# Patient Record
Sex: Female | Born: 1960 | Race: White | Hispanic: No | Smoking: Never smoker
Health system: Southern US, Community
[De-identification: ages and names within clinical notes are randomized; demographics above are authoritative.]

## PROBLEM LIST (undated history)

## (undated) DIAGNOSIS — G4730 Sleep apnea, unspecified: Secondary | ICD-10-CM

## (undated) DIAGNOSIS — T7840XA Allergy, unspecified, initial encounter: Secondary | ICD-10-CM

## (undated) DIAGNOSIS — K589 Irritable bowel syndrome without diarrhea: Secondary | ICD-10-CM

## (undated) DIAGNOSIS — F419 Anxiety disorder, unspecified: Secondary | ICD-10-CM

## (undated) DIAGNOSIS — J329 Chronic sinusitis, unspecified: Secondary | ICD-10-CM

## (undated) DIAGNOSIS — B358 Other dermatophytoses: Secondary | ICD-10-CM

## (undated) DIAGNOSIS — F3289 Other specified depressive episodes: Secondary | ICD-10-CM

## (undated) DIAGNOSIS — E785 Hyperlipidemia, unspecified: Secondary | ICD-10-CM

## (undated) DIAGNOSIS — R1032 Left lower quadrant pain: Secondary | ICD-10-CM

## (undated) DIAGNOSIS — R197 Diarrhea, unspecified: Secondary | ICD-10-CM

## (undated) DIAGNOSIS — F329 Major depressive disorder, single episode, unspecified: Secondary | ICD-10-CM

## (undated) DIAGNOSIS — K5730 Diverticulosis of large intestine without perforation or abscess without bleeding: Secondary | ICD-10-CM

## (undated) HISTORY — DX: Other dermatophytoses: B35.8

## (undated) HISTORY — DX: Hyperlipidemia, unspecified: E78.5

## (undated) HISTORY — DX: Diarrhea, unspecified: R19.7

## (undated) HISTORY — DX: Left lower quadrant pain: R10.32

## (undated) HISTORY — PX: NASAL SINUS SURGERY: SHX719

## (undated) HISTORY — DX: Allergy, unspecified, initial encounter: T78.40XA

## (undated) HISTORY — PX: COLONOSCOPY: SHX174

## (undated) HISTORY — DX: Anxiety disorder, unspecified: F41.9

## (undated) HISTORY — DX: Other specified depressive episodes: F32.89

## (undated) HISTORY — PX: OTHER SURGICAL HISTORY: SHX169

## (undated) HISTORY — DX: Major depressive disorder, single episode, unspecified: F32.9

## (undated) HISTORY — DX: Sleep apnea, unspecified: G47.30

## (undated) HISTORY — DX: Diverticulosis of large intestine without perforation or abscess without bleeding: K57.30

## (undated) HISTORY — DX: Irritable bowel syndrome, unspecified: K58.9

## (undated) HISTORY — DX: Chronic sinusitis, unspecified: J32.9

---

## 1995-09-11 HISTORY — PX: ABDOMINAL HYSTERECTOMY: SHX81

## 1998-05-13 ENCOUNTER — Other Ambulatory Visit: Admission: RE | Admit: 1998-05-13 | Discharge: 1998-05-13 | Payer: Self-pay | Admitting: Obstetrics and Gynecology

## 1999-06-06 ENCOUNTER — Emergency Department (HOSPITAL_COMMUNITY): Admission: EM | Admit: 1999-06-06 | Discharge: 1999-06-06 | Payer: Self-pay | Admitting: Emergency Medicine

## 2004-10-06 ENCOUNTER — Ambulatory Visit (HOSPITAL_COMMUNITY): Admission: RE | Admit: 2004-10-06 | Discharge: 2004-10-06 | Payer: Self-pay | Admitting: Family Medicine

## 2005-03-22 ENCOUNTER — Ambulatory Visit: Payer: Self-pay | Admitting: Internal Medicine

## 2005-05-16 ENCOUNTER — Ambulatory Visit: Payer: Self-pay | Admitting: Internal Medicine

## 2006-01-23 ENCOUNTER — Other Ambulatory Visit: Admission: RE | Admit: 2006-01-23 | Discharge: 2006-01-23 | Payer: Self-pay | Admitting: Family Medicine

## 2008-02-23 ENCOUNTER — Other Ambulatory Visit: Admission: RE | Admit: 2008-02-23 | Discharge: 2008-02-23 | Payer: Self-pay | Admitting: Family Medicine

## 2009-02-17 ENCOUNTER — Emergency Department (HOSPITAL_BASED_OUTPATIENT_CLINIC_OR_DEPARTMENT_OTHER): Admission: EM | Admit: 2009-02-17 | Discharge: 2009-02-17 | Payer: Self-pay | Admitting: Emergency Medicine

## 2009-02-17 ENCOUNTER — Ambulatory Visit: Payer: Self-pay | Admitting: Diagnostic Radiology

## 2009-03-01 ENCOUNTER — Encounter: Admission: RE | Admit: 2009-03-01 | Discharge: 2009-03-01 | Payer: Self-pay | Admitting: Family Medicine

## 2009-03-11 ENCOUNTER — Encounter (INDEPENDENT_AMBULATORY_CARE_PROVIDER_SITE_OTHER): Payer: Self-pay | Admitting: *Deleted

## 2009-03-11 ENCOUNTER — Ambulatory Visit: Payer: Self-pay | Admitting: Gastroenterology

## 2009-03-11 ENCOUNTER — Ambulatory Visit: Payer: Self-pay | Admitting: Cardiovascular Disease

## 2009-03-11 DIAGNOSIS — K5730 Diverticulosis of large intestine without perforation or abscess without bleeding: Secondary | ICD-10-CM | POA: Insufficient documentation

## 2009-03-11 DIAGNOSIS — J329 Chronic sinusitis, unspecified: Secondary | ICD-10-CM | POA: Insufficient documentation

## 2009-03-11 DIAGNOSIS — F3289 Other specified depressive episodes: Secondary | ICD-10-CM | POA: Insufficient documentation

## 2009-03-11 DIAGNOSIS — R1032 Left lower quadrant pain: Secondary | ICD-10-CM | POA: Insufficient documentation

## 2009-03-11 DIAGNOSIS — E785 Hyperlipidemia, unspecified: Secondary | ICD-10-CM | POA: Insufficient documentation

## 2009-03-11 DIAGNOSIS — R110 Nausea: Secondary | ICD-10-CM | POA: Insufficient documentation

## 2009-03-11 DIAGNOSIS — F329 Major depressive disorder, single episode, unspecified: Secondary | ICD-10-CM

## 2009-03-11 LAB — CONVERTED CEMR LAB
BUN: 10 mg/dL (ref 6–23)
Basophils Absolute: 0.2 10*3/uL — ABNORMAL HIGH (ref 0.0–0.1)
Basophils Relative: 4 % — ABNORMAL HIGH (ref 0.0–3.0)
Bilirubin Urine: NEGATIVE
CO2: 31 meq/L (ref 19–32)
Calcium: 9.1 mg/dL (ref 8.4–10.5)
Chloride: 102 meq/L (ref 96–112)
Creatinine, Ser: 0.9 mg/dL (ref 0.4–1.2)
Eosinophils Absolute: 0.5 10*3/uL (ref 0.0–0.7)
Eosinophils Relative: 9.4 % — ABNORMAL HIGH (ref 0.0–5.0)
GFR calc non Af Amer: 70.39 mL/min (ref >60)
Glucose, Bld: 97 mg/dL (ref 70–99)
HCT: 41.3 % (ref 36.0–46.0)
Hemoglobin, Urine: NEGATIVE
Hemoglobin: 14.4 g/dL (ref 12.0–15.0)
Ketones, ur: NEGATIVE mg/dL
Lymphocytes Relative: 18.9 % (ref 12.0–46.0)
Lymphs Abs: 1 10*3/uL (ref 0.7–4.0)
MCHC: 34.8 g/dL (ref 30.0–36.0)
MCV: 87.4 fL (ref 78.0–100.0)
Monocytes Absolute: 0.5 10*3/uL (ref 0.1–1.0)
Monocytes Relative: 8.9 % (ref 3.0–12.0)
Neutro Abs: 3.1 10*3/uL (ref 1.4–7.7)
Neutrophils Relative %: 58.8 % (ref 43.0–77.0)
Nitrite: NEGATIVE
Platelets: 249 10*3/uL (ref 150.0–400.0)
Potassium: 3.8 meq/L (ref 3.5–5.1)
RBC: 4.72 M/uL (ref 3.87–5.11)
RDW: 12.6 % (ref 11.5–14.6)
Sodium: 139 meq/L (ref 135–145)
Specific Gravity, Urine: <=1.005 (ref 1.000–1.030)
Total Protein, Urine: NEGATIVE mg/dL
Urine Glucose: NEGATIVE mg/dL
Urobilinogen, UA: 0.2 (ref 0.0–1.0)
WBC: 5.3 10*3/uL (ref 4.5–10.5)
pH: 7 (ref 5.0–8.0)

## 2009-03-15 ENCOUNTER — Telehealth (INDEPENDENT_AMBULATORY_CARE_PROVIDER_SITE_OTHER): Payer: Self-pay | Admitting: *Deleted

## 2009-03-15 ENCOUNTER — Telehealth: Payer: Self-pay | Admitting: Internal Medicine

## 2009-03-15 DIAGNOSIS — K625 Hemorrhage of anus and rectum: Secondary | ICD-10-CM | POA: Insufficient documentation

## 2009-03-16 ENCOUNTER — Ambulatory Visit: Payer: Self-pay | Admitting: Internal Medicine

## 2009-03-17 ENCOUNTER — Telehealth: Payer: Self-pay | Admitting: Internal Medicine

## 2009-03-18 ENCOUNTER — Ambulatory Visit (HOSPITAL_COMMUNITY): Admission: RE | Admit: 2009-03-18 | Discharge: 2009-03-18 | Payer: Self-pay | Admitting: Internal Medicine

## 2009-03-18 ENCOUNTER — Encounter: Payer: Self-pay | Admitting: Internal Medicine

## 2009-03-21 ENCOUNTER — Encounter: Admission: RE | Admit: 2009-03-21 | Discharge: 2009-03-21 | Payer: Self-pay | Admitting: Family Medicine

## 2009-03-28 ENCOUNTER — Ambulatory Visit: Payer: Self-pay | Admitting: Internal Medicine

## 2009-03-28 ENCOUNTER — Inpatient Hospital Stay (HOSPITAL_COMMUNITY): Admission: AD | Admit: 2009-03-28 | Discharge: 2009-03-30 | Payer: Self-pay | Admitting: Internal Medicine

## 2009-03-28 ENCOUNTER — Telehealth: Payer: Self-pay | Admitting: Internal Medicine

## 2009-03-28 DIAGNOSIS — K589 Irritable bowel syndrome without diarrhea: Secondary | ICD-10-CM | POA: Insufficient documentation

## 2009-03-28 DIAGNOSIS — R197 Diarrhea, unspecified: Secondary | ICD-10-CM | POA: Insufficient documentation

## 2009-03-31 ENCOUNTER — Telehealth: Payer: Self-pay | Admitting: Internal Medicine

## 2009-04-11 ENCOUNTER — Encounter: Admission: RE | Admit: 2009-04-11 | Discharge: 2009-06-16 | Payer: Self-pay | Admitting: Family Medicine

## 2009-04-12 ENCOUNTER — Telehealth: Payer: Self-pay | Admitting: Internal Medicine

## 2009-04-27 ENCOUNTER — Ambulatory Visit: Payer: Self-pay | Admitting: Internal Medicine

## 2009-04-27 DIAGNOSIS — G4730 Sleep apnea, unspecified: Secondary | ICD-10-CM | POA: Insufficient documentation

## 2009-05-19 ENCOUNTER — Telehealth: Payer: Self-pay | Admitting: Internal Medicine

## 2009-05-20 ENCOUNTER — Ambulatory Visit: Payer: Self-pay | Admitting: Internal Medicine

## 2009-05-21 ENCOUNTER — Encounter: Payer: Self-pay | Admitting: Internal Medicine

## 2009-12-02 ENCOUNTER — Ambulatory Visit: Payer: Self-pay | Admitting: Gastroenterology

## 2009-12-02 ENCOUNTER — Telehealth: Payer: Self-pay | Admitting: Internal Medicine

## 2009-12-02 DIAGNOSIS — K5900 Constipation, unspecified: Secondary | ICD-10-CM | POA: Insufficient documentation

## 2009-12-02 DIAGNOSIS — R143 Flatulence: Secondary | ICD-10-CM

## 2009-12-02 DIAGNOSIS — R142 Eructation: Secondary | ICD-10-CM | POA: Insufficient documentation

## 2009-12-02 DIAGNOSIS — R1031 Right lower quadrant pain: Secondary | ICD-10-CM | POA: Insufficient documentation

## 2009-12-02 DIAGNOSIS — R141 Gas pain: Secondary | ICD-10-CM

## 2009-12-03 ENCOUNTER — Emergency Department (HOSPITAL_COMMUNITY): Admission: EM | Admit: 2009-12-03 | Discharge: 2009-12-03 | Payer: Self-pay | Admitting: Emergency Medicine

## 2009-12-05 ENCOUNTER — Ambulatory Visit: Payer: Self-pay | Admitting: Nurse Practitioner

## 2009-12-05 ENCOUNTER — Telehealth: Payer: Self-pay | Admitting: Nurse Practitioner

## 2009-12-05 LAB — CONVERTED CEMR LAB
BUN: 8 mg/dL (ref 6–23)
Basophils Absolute: 0.1 10*3/uL (ref 0.0–0.1)
Basophils Absolute: 0 10*3/uL (ref 0.0–0.1)
Basophils Relative: 1.1 % (ref 0.0–3.0)
Basophils Relative: 0.6 % (ref 0.0–3.0)
CO2: 32 meq/L (ref 19–32)
CRP, High Sensitivity: 41.1 — ABNORMAL HIGH (ref 0.00–5.00)
Calcium: 9.4 mg/dL (ref 8.4–10.5)
Chloride: 101 meq/L (ref 96–112)
Creatinine, Ser: 0.9 mg/dL (ref 0.4–1.2)
Eosinophils Absolute: 0.2 10*3/uL (ref 0.0–0.7)
Eosinophils Absolute: 0.1 10*3/uL (ref 0.0–0.7)
Eosinophils Relative: 2.6 % (ref 0.0–5.0)
Eosinophils Relative: 1.7 % (ref 0.0–5.0)
GFR calc non Af Amer: 70.19 mL/min (ref >60)
Glucose, Bld: 94 mg/dL (ref 70–99)
HCT: 40.3 % (ref 36.0–46.0)
HCT: 41.8 % (ref 36.0–46.0)
Hemoglobin: 13.3 g/dL (ref 12.0–15.0)
Hemoglobin: 13.8 g/dL (ref 12.0–15.0)
Lymphocytes Relative: 23.5 % (ref 12.0–46.0)
Lymphocytes Relative: 18.8 % (ref 12.0–46.0)
Lymphs Abs: 1.4 10*3/uL (ref 0.7–4.0)
Lymphs Abs: 0.8 10*3/uL (ref 0.7–4.0)
MCHC: 33.1 g/dL (ref 30.0–36.0)
MCHC: 33 g/dL (ref 30.0–36.0)
MCV: 89.1 fL (ref 78.0–100.0)
MCV: 87.7 fL (ref 78.0–100.0)
Monocytes Absolute: 0.5 10*3/uL (ref 0.1–1.0)
Monocytes Absolute: 0.3 10*3/uL (ref 0.1–1.0)
Monocytes Relative: 8.7 % (ref 3.0–12.0)
Monocytes Relative: 7.1 % (ref 3.0–12.0)
Neutro Abs: 3.9 10*3/uL (ref 1.4–7.7)
Neutro Abs: 2.9 10*3/uL (ref 1.4–7.7)
Neutrophils Relative %: 64.1 % (ref 43.0–77.0)
Neutrophils Relative %: 71.8 % (ref 43.0–77.0)
Platelets: 251 10*3/uL (ref 150.0–400.0)
Platelets: 253 10*3/uL (ref 150.0–400.0)
Potassium: 3.9 meq/L (ref 3.5–5.1)
RBC: 4.52 M/uL (ref 3.87–5.11)
RBC: 4.76 M/uL (ref 3.87–5.11)
RDW: 12.4 % (ref 11.5–14.6)
RDW: 12.4 % (ref 11.5–14.6)
Sed Rate: 41 mm/hr — ABNORMAL HIGH (ref 0–22)
Sodium: 142 meq/L (ref 135–145)
WBC: 6.1 10*3/uL (ref 4.5–10.5)
WBC: 4.1 10*3/uL — ABNORMAL LOW (ref 4.5–10.5)

## 2009-12-06 ENCOUNTER — Telehealth: Payer: Self-pay | Admitting: Nurse Practitioner

## 2009-12-08 ENCOUNTER — Ambulatory Visit: Payer: Self-pay | Admitting: Internal Medicine

## 2009-12-08 ENCOUNTER — Telehealth: Payer: Self-pay | Admitting: Nurse Practitioner

## 2009-12-14 ENCOUNTER — Ambulatory Visit: Payer: Self-pay | Admitting: Internal Medicine

## 2009-12-21 ENCOUNTER — Telehealth: Payer: Self-pay | Admitting: Internal Medicine

## 2009-12-29 ENCOUNTER — Telehealth: Payer: Self-pay | Admitting: Internal Medicine

## 2010-06-23 ENCOUNTER — Encounter (HOSPITAL_COMMUNITY)
Admission: RE | Admit: 2010-06-23 | Discharge: 2010-07-07 | Payer: Self-pay | Source: Home / Self Care | Attending: Cardiology | Admitting: Cardiology

## 2010-10-12 NOTE — Progress Notes (Signed)
Summary: Triage  Phone Note Call from Patient Call back at Home Phone [phone] Call back at [phone]   Caller: Patient Call For: Willette Cluster Reason for Call: Talk to Nurse Summary of Call: pt is light headed, dizzy and weak. Has not taken the Lorazepam. Does not feel like any of the meds are working.  Initial call taken by: Karna Christmas,  December 08, 2009 8:04 AM  Follow-up for Phone Call        Per Gunnar Fusi we gave her an order to go down to [facility] for a Flat & Upright 2 view.  Also told her Gunnar Fusi thinks she needs to take the Lorazepam.  The pt said she can only take that when she is not driving.  Gunnar Fusi will talk to Gavin Pound about getting this pt an appt with Dr. Juanda Chance. Follow-up by: Joselyn Glassman,  December 08, 2009 9:38 AM  Additional Follow-up for Phone Call Additional follow up Details #1::        See Ultrasound report and Dr.Brodie's recommendations. Pt. to keep scheduled office visit on 12-14-09 at 8:45am. Pt. instructed to call back as needed.  Additional Follow-up by: Laureen Ochs LPN,  December 08, 2009 2:12 PM  New Problems: ABDOMINAL DISTENSION (ICD-787.3)   New Problems: ABDOMINAL DISTENSION (ICD-787.3)

## 2010-10-12 NOTE — Progress Notes (Signed)
Summary: Told to call with her progress  Phone Note Call from Patient Call back at [phone]   Call For: Jamie Rodes, NP Reason for Call: Talk to Nurse Summary of Call: Says she was told to call everyday to report her progress. would like to speak to [facility].  Initial call taken by: Leanor Kail [facility],  December 06, 2009 10:29 AM  Follow-up for Phone Call        Called and LM and asked pt to call me back.  I apologized for not calling her back when she called yesterday.  Pt has barely any blood now when she uses the toilet paper after a BM.  She is feeling some better but her stomach a little distended and feels hard. I asked about her BM's and she said small amt of stool but is going daily.  After talking to Willette Cluster NP,  she advised the pt try to purge bowels with 2-3 doses of Miralax and she is to call me tomorrow with a progress report. Follow-up by: Joselyn Glassman,  December 07, 2009 9:14 AM

## 2010-10-12 NOTE — Progress Notes (Signed)
Summary: Triage  Phone Note Call from Patient Call back at [phone] Cell   Caller: Patient Call For: Willette Cluster Reason for Call: Talk to Nurse Summary of Call: Pt was in ER Sat. w/rectal bleeding-wants to discuss Initial call taken by: Karna Christmas,  December 05, 2009 8:12 AM  Follow-up for Phone Call        Spoke with patient- her hbg was 14.3  in ER. She has passed clots since ER visit. Mild dizziness (when I asked about it), no SOB or chest pain. She will come here for CBC. Her abdominal pain is really more of a discomfort, Bentyl does help.  Pam -  put CBC in today 12-05-09 STAT.  Pt coming here for results Follow-up by: Willette Cluster NP,  December 05, 2009 9:29 AM  Additional Follow-up for Phone Call Additional follow up Details #1::        Per Gunnar Fusi we faxed a signed perscription for Ativan 0.5 MG one in the AM and sent RX for Anusol Supp.  Additional Follow-up by: Joselyn Glassman,  December 05, 2009 10:49 AM    Additional Follow-up for Phone Call Additional follow up Details #2::    Patient has severe anxiety about possibility of ever getting cancer. She is 48 now and her mother was diagnosed with ovarian cancer when she was 8. Patient has had colonoscopy, CTscans in the past in the setting of her same abdominal pain. I reassured her. Her abdominal pain is really minimal. [place] or two.   New/Updated Medications: ATIVAN 0.5 MG TABS (LORAZEPAM)  ATIVAN 0.5 MG TABS (LORAZEPAM) Take 1  tab in the AM ANUSOL-HC 25 MG SUPP (HYDROCORTISONE ACETATE) Use rectal suppositories twice daily for 10 days Prescriptions: ATIVAN 0.5 MG TABS (LORAZEPAM) Take 1  tab in the AM  #15 x 0   Entered by:   Lowry Ram NCMA   Authorized by:   Willette Cluster NP   Signed by:   Lowry Ram NCMA on 12/05/2009   Method used:   Printed then faxed to ...       [facility]  [address] [id number]* (retail)       [address]       [place], [place]   [address]       Ph: [phone] or [phone]       Fax: [phone]   RxID:   [id number] ANUSOL-HC 25 MG SUPP (HYDROCORTISONE ACETATE) Use rectal suppositories twice daily for 10 days  #20 x 0   Entered by:   Lowry Ram NCMA   Authorized by:   Willette Cluster NP   Signed by:   Lowry Ram NCMA on 12/05/2009   Method used:   Electronically to        [facility]  [address] [id number]* (retail)       [address]       [place], [place]  [address]       Ph: [phone] or [phone]       Fax: [phone]   RxID:   [id number] ATIVAN 0.5 MG TABS (LORAZEPAM)   #15 x 0   Entered by:   Lowry Ram NCMA   Authorized by:   Willette Cluster NP   Signed by:   Lowry Ram NCMA on 12/05/2009   Method used:   Printed then faxed to .Marland KitchenMarland Kitchen  [facility]  [address] # [id number]* (retail)       [address]       [place], [place]  [address]       Ph: [phone] or [phone]       Fax: [phone]   RxID:   [id number]

## 2010-10-12 NOTE — Progress Notes (Signed)
Summary: med update  Phone Note Call from Patient Call back at Home Phone [phone]   Caller: Patient Call For: Dr. Juanda Chance Reason for Call: Talk to Nurse Summary of Call: wants Dr. Juanda Chance to be aware that Dr. Sharyn Lull has put pt on Cephalexin 500mg  3xday Initial call taken by: Vallarie Mare,  December 29, 2009 9:07 AM  Follow-up for Phone Call        Msg. left to inform pt. I will forward info. to Dr.Brodie. Pt. instructed to call back as needed.  Follow-up by: Laureen Ochs LPN,  December 29, 2009 9:28 AM  Additional Follow-up for Phone Call Additional follow up Details #1::        she has a hx of PMColitis 2010. So please call her to take a Probiotic with her Keflex and call us if she develops diarrhea. Additional Follow-up by: Hart Carwin MD,  December 29, 2009 1:26 PM    Additional Follow-up for Phone Call Additional follow up Details #2::    patient aware.  She will call for further problems. Follow-up by: Darcey Nora RN, CGRN,  December 29, 2009 1:41 PM  *

## 2010-10-12 NOTE — Assessment & Plan Note (Signed)
Summary: F/U LLQ ABD PAIN, saw NP   History of Present Illness Visit Type: Follow-up Visit Primary GI MD: [name] MD Primary Provider: Merri Brunette, MD Requesting Provider: n/a Chief Complaint: F/u for LLQ abd pain.  Pt states that she is feeling better and pain is going away.  History of Present Illness:   50 year old, white female with acute right lower quadrant abdominal pain which started on March 23 and has now subsided. It was  associated rectal pain and rectal bleeding which also has subsided after Anusol-HC suppositories. She sawPaula Guenther,NP on 12/02/09. Her sedimentation rate was elevated at 41 and a KUB showed increased air SB consistent with an ileus. She is now 100% improved. Last colonoscopy in July 2010 was normal. 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 Prior colonoscopy were in 2006   GI Review of Systems      Denies abdominal pain, acid reflux, belching, bloating, chest pain, dysphagia with liquids, dysphagia with solids, heartburn, loss of appetite, nausea, vomiting, vomiting blood, weight loss, and  weight gain.        Denies anal fissure, black tarry stools, change in bowel habit, constipation, diarrhea, diverticulosis, fecal incontinence, heme positive stool, hemorrhoids, irritable bowel syndrome, jaundice, light color stool, liver problems, rectal bleeding, and  rectal pain.    Current Medications (verified): 1)  Ambien Cr 12.5 Mg Cr-Tabs (Zolpidem Tartrate) .... Take 1 Tab By Mouth At Bedtime As Needed 2)  Pristiq 50 Mg Xr24h-Tab (Desvenlafaxine Succinate) .... Take 1 Tablet By Mouth Once A Day 3)  Fexofenadine Hcl 180 Mg Tabs (Fexofenadine Hcl) .... Take 1 Tablet By Mouth Once A Day As Needed 4)  Simvastatin 80 Mg Tabs (Simvastatin) .... One Tablet By Mouth Once Daily 5)  Vitamin D (Ergocalciferol) 50000 Unit Caps (Ergocalciferol) .... Take 1 Capsule By Mouth Once Per Week 6)  Anusol-Hc 25 Mg Supp (Hydrocortisone Acetate) .... Use Rectal  Suppositories Twice Daily For 10 Days 7)  Vegetal Silica .... One Tablet By Mouth Once Daily  Allergies (verified): 1)  ! Compazine 2)  ! Codeine 3)  ! * Ivp Dye  Past History:  Past Medical History: SLEEP APNEA (ICD-780.57) DIVERTICULOSIS-COLON (ICD-562.10) IRRITABLE BOWEL SYNDROME (ICD-564.1) DIARRHEA-PRESUMED INFECTIOUS (ICD-009.3) DIVERTICULOSIS-COLON (ICD-562.10) ABDOMINAL PAIN-LLQ (ICD-789.04)) ABDOMINAL PAIN, LEFT LOWER QUADRANT (ICD-789.04) HYPERLIPIDEMIA (ICD-272.4) DEPRESSION (ICD-311) SINUSITIS, CHRONIC (ICD-473.9)      Past Surgical History: Reviewed history from 03/11/2009 and no changes required. Hysterectomy/BSO 1997-FIBROIDS Cystectomy from right elbow Sinus Surgery x 6  Family History: Reviewed history from 03/11/2009 and no changes required. No FH of Colon Cancer: Family History of Ovarian Cancer: Mother Family History of Prostate Cancer: Father Family History of Colon Polyps: Father Family History of Heart Disease: Father  Social History: Unemployed Patient has never smoked.  Alcohol Use - yes-very rare Daily Caffeine Use-3 daily Illicit Drug Use - no Patient does not get regular exercise.   Review of Systems  The patient denies allergy/sinus, anemia, anxiety-new, arthritis/joint pain, back pain, blood in urine, breast changes/lumps, change in vision, confusion, cough, coughing up blood, depression-new, fainting, fatigue, fever, headaches-new, hearing problems, heart murmur, heart rhythm changes, itching, menstrual pain, muscle pains/cramps, night sweats, nosebleeds, pregnancy symptoms, shortness of breath, skin rash, sleeping problems, sore throat, swelling of feet/legs, swollen lymph glands, thirst - excessive , urination - excessive , urination changes/pain, urine leakage, vision changes, and voice change.         Pertinent positive and negative review of systems were noted in the above HPI. All other  ROS was otherwise negative.   Vital  Signs:  Patient profile:   50 year old female Height:      63 inches Weight:      148 pounds BMI:     26.31 BSA:     1.70 Pulse rate:   68 / minute Pulse rhythm:   regular BP sitting:   120 / 82  (left arm) Cuff size:   regular  Vitals Entered By: Ok Anis CMA (December 14, 2009 8:41 AM)  Physical Exam  General:  Well developed, well nourished, no acute distress. Eyes:  PERRLA, no icterus. Mouth:  No deformity or lesions, dentition normal. Neck:  Supple; no masses or thyromegaly. Lungs:  Clear throughout to auscultation. Heart:  Regular rate and rhythm; no murmurs, rubs,  or bruits. Abdomen:  soft protuberant abdomen with decreased muscle tone and normoactive bowel sounds. Minimal tenderness in epigastrium. Prominent xiphoid process. Lower abdomen unremarkable. Right lower conference today is not tender Rectal:  rectal end anoscopic exam reveals some small hemorrhoidal tags normal anal canal and normal rectal mucosa of the ampulla. No evidence of proctitis or hemorrhoids. Stool is Hemoccult negative it no evidence of acute fissure Extremities:  No clubbing, cyanosis, edema or deformities noted. Skin:  Intact without significant lesions or rashes. Psych:  Alert and cooperative. Normal mood and affect.   Impression & Recommendations:  Problem # 1:  ABDOMINAL DISTENSION (ICD-787.3) irritable bowel syndrome , status post exacerbation. She has responded to dicyclomine and bowel rest.  Problem # 2:  RECTAL BLEEDING (ICD-569.3) suspect anal fissure which has  healed. On today's exam I could not document any rectal pathology. She is up-to-date on her colonoscopy. She may discontinue Anusol-HC suppository and use them only as p.r.n.  Patient Instructions: 1)  high-fiber diet 2)  Anusol-HC suppositories p.r.n. 3)  Dicyclomine 10 mg p.o. p.r.n. abdominal pain 4)  The medication list was reviewed and reconciled.  All changed / newly prescribed medications were explained.  A complete  medication list was provided to the patient / caregiver.

## 2010-10-12 NOTE — Progress Notes (Signed)
Summary: Condition Update  Phone Note Call from Patient Call back at Home Phone [phone] Call back at cell [phone]   Caller: Patient Call For: Juanda Chance Reason for Call: Talk to Nurse Summary of Call: Patient is calling to let Dr Juanda Chance know that she may still have blood in her stools. Initial call taken by: Tawni Levy,  December 21, 2009 8:51 AM  Follow-up for Phone Call        Last OV 12-14-09. Pt. sees red in the toilet water after she leaves the stool sit in the toilet for 20-30 minutes. Doesn't see blood on stool or tissue. Feels well. Does think she may be loosing weight, doesn't weigh herself.  1) Weigh weekly, call for any concerns 2) Call for any BRB in toilet or on tissue 3) If symptoms become worse call back immediately. Follow-up by: Laureen Ochs LPN,  December 21, 2009 11:06 AM  Additional Follow-up for Phone Call Additional follow up Details #1::        I agree.  Additional Follow-up by: Hart Carwin MD,  December 21, 2009 11:14 PM

## 2010-10-12 NOTE — Assessment & Plan Note (Signed)
Summary: LLQ abdominal pain/sheri   History of Present Illness Visit Type: Follow-up Visit Primary GI MD: [name] MD Primary Provider: Merri Brunette, MD Requesting Provider: n/a Chief Complaint: Lower abdominal Pain and bloating History of Present Illness:   Ms. Jamie Levy is here with recurrent abdominal pain, started Wed. Pain sharp, mid abdominal pain. She also has rectal pain and bloating. Subjective fever Wed. night. No nausea. No diarrhea. She has a daily BM but they are of less volume than usual. Patient is extremely scared of cancer. Mother had ovarian cancer, another family memeber with bladder cancer.    GI Review of Systems    Reports abdominal pain and  bloating.     Location of  Abdominal pain: lower abdomen.    Denies acid reflux, belching, chest pain, dysphagia with liquids, dysphagia with solids, heartburn, loss of appetite, nausea, vomiting, vomiting blood, weight loss, and  weight gain.      Reports change in bowel habits and  rectal pain.     Denies anal fissure, black tarry stools, constipation, diarrhea, diverticulosis, fecal incontinence, heme positive stool, hemorrhoids, irritable bowel syndrome, jaundice, light color stool, liver problems, and  rectal bleeding.    Current Medications (verified): 1)  Ambien Cr 12.5 Mg Cr-Tabs (Zolpidem Tartrate) .... Take 1 Tab By Mouth At Bedtime As Needed 2)  Pristiq 50 Mg Xr24h-Tab (Desvenlafaxine Succinate) .... Take 1 Tablet By Mouth Once A Day 3)  Fexofenadine Hcl 180 Mg Tabs (Fexofenadine Hcl) .... Take 1 Tablet By Mouth Once A Day As Needed 4)  Crestor 40 Mg Tabs (Rosuvastatin Calcium) .... One Tablet By Mouth Once Daily 5)  Vitamin D (Ergocalciferol) 50000 Unit Caps (Ergocalciferol) .... Take 1 Capsule By Mouth Once Per Week  Allergies (verified): 1)  ! Compazine 2)  ! Codeine 3)  ! * Ivp Dye  Past History:  Past Medical History: SLEEP APNEA (ICD-780.57) DIVERTICULOSIS-COLON (ICD-562.10) IRRITABLE BOWEL SYNDROME  (ICD-564.1) DIARRHEA-PRESUMED INFECTIOUS (ICD-009.3) DIVERTICULOSIS-COLON (ICD-562.10) ABDOMINAL PAIN-LLQ (ICD-789.04)) ABDOMINAL PAIN, LEFT LOWER QUADRANT (ICD-789.04) HYPERLIPIDEMIA (ICD-272.4) DIVERTICULOSIS OF COLON (ICD-562.10) DEPRESSION (ICD-311) SINUSITIS, CHRONIC (ICD-473.9)      Past Surgical History: Reviewed history from 03/11/2009 and no changes required. Hysterectomy/BSO 1997-FIBROIDS Cystectomy from right elbow Sinus Surgery x 6  Family History: Reviewed history from 03/11/2009 and no changes required. No FH of Colon Cancer: Family History of Ovarian Cancer: Mother Family History of Prostate Cancer: Father Family History of Colon Polyps: Father Family History of Heart Disease: Father  Social History: Reviewed history from 03/11/2009 and no changes required. Patient has never smoked.  Alcohol Use - yes-very rare Daily Caffeine Use-3 daily Illicit Drug Use - no Patient does not get regular exercise.   Review of Systems       The patient complains of back pain.  The patient denies allergy/sinus, anemia, anxiety-new, arthritis/joint pain, blood in urine, breast changes/lumps, change in vision, confusion, cough, coughing up blood, depression-new, fainting, fatigue, fever, headaches-new, hearing problems, heart murmur, heart rhythm changes, itching, muscle pains/cramps, night sweats, nosebleeds, shortness of breath, skin rash, sleeping problems, sore throat, swelling of feet/legs, swollen lymph glands, thirst - excessive, urination - excessive, urination changes/pain, urine leakage, vision changes, and voice change.    Vital Signs:  Patient profile:   50 year old female Height:      63 inches Weight:      148 pounds BMI:     26.31 Pulse rate:   68 / minute Pulse rhythm:   regular BP sitting:   118 / 80  (  left arm)  Vitals Entered By: Milford Cage NCMA (December 02, 2009 2:13 PM)  Physical Exam  General:  Well developed, well nourished, no acute  distress. Head:  Normocephalic and atraumatic. Eyes:  Conjunctiva pink, no icterus.  Neck:  no obvious masses  Lungs:  Clear throughout to auscultation. Heart:  Regular rate and rhythm; no murmurs, rubs,  or bruits. Abdomen:  Abdomen soft, nontender, nondistended. No obvious masses or hepatomegaly.Normal bowel sounds.  Rectal:  No external / internal masses appreciated. No pain on digital exam. Scant light brown stool in vault, heme negative Msk:  Symmetrical with no gross deformities. Normal posture. Neurologic:  Alert and  oriented x4;  grossly normal neurologically. Skin:  Intact without significant lesions or rashes. Psych:  Alert and cooperative.Slightly anxious.   Impression & Recommendations:  Problem # 1:  ABDOMINAL PAIN RIGHT LOWER QUADRANT (ICD-789.03) Assessment Deteriorated Patient has chronic, intermittent abdominal pain. It is, and has been associated with rectal discomfort in the past. Stool less in volume lately. Patient has a history of diverticulosis,  CTscan negative in the past in the setting of same symptoms (patient cannot remember if she got better on antibiotics in the past). She has very localized tenderness in RLQ, just below level of umbilicus. Otherwise, she looks good, vitals stable. Patient is very worried about possiblity of getting cancer. Reassurance given. Will check labs. Trial of antispasmotic, purge bowels.  Patient will be called with test results and any further recommendations based on those results. Follow up with Dr. Juanda Chance.  Orders: TLB-CBC Platelet - w/Differential (85025-CBCD) TLB-CRP-High Sensitivity (C-Reactive Protein) (86140-FCRP) TLB-Sedimentation Rate (ESR) (85652-ESR) TLB-BMP (Basic Metabolic Panel-BMET) (80048-METABOL)  Patient Instructions: 1)  Your physician has requested that you have the following labwork done today: Go to basement level lab. 2)  We sent a perscription for Bentyl to your pharmacy, [facility]. 3)   Take Miralax twice daily until adequate Bowel Movement. 4)  The medication list was reviewed and reconciled.  All changed / newly prescribed medications were explained.  A complete medication list was provided to the patient / caregiver. Prescriptions: BENTYL 10 MG CAPS (DICYCLOMINE HCL) Take 1 tab twice daily x 14 days  #28 x 0   Entered by:   Lowry Ram NCMA   Authorized by:   Willette Cluster NP   Signed by:   Lowry Ram NCMA on 12/02/2009   Method used:   Electronically to        [facility]  [address] [facility]* (retail)       [address]       [place], [place]  [address]       Ph: [phone] or [phone]       Fax: [phone]   RxID:   [id number]

## 2010-10-12 NOTE — Progress Notes (Signed)
Summary: triage  Phone Note Call from Patient Call back at Home Phone [phone] Call back at [phone]   Caller: Patient Call For: Juanda Chance Reason for Call: Talk to Nurse Summary of Call: Patient has a lot of abd pain sice Wed. wants to be seen today. Initial call taken by: Tawni Levy,  December 02, 2009 8:05 AM  Follow-up for Phone Call        Patient  c/o LLQ pain and tenderness.  pain is sharp , hurts to walk.  Pain started on Wed.  Patient  will come in today and see Willette Cluster RNP at 2:00.  She is asked to be on a liquid diet until she is seen this afternoon. Follow-up by: Darcey Nora RN, CGRN,  December 02, 2009 8:15 AM

## 2010-12-01 ENCOUNTER — Telehealth: Payer: Self-pay | Admitting: Internal Medicine

## 2010-12-01 NOTE — Telephone Encounter (Signed)
Forwarded to Dr. Norins for review. °

## 2010-12-04 LAB — COMPREHENSIVE METABOLIC PANEL
ALT: 20 U/L (ref 0–35)
AST: 24 U/L (ref 0–37)
Albumin: 4.3 g/dL (ref 3.5–5.2)
Alkaline Phosphatase: 57 U/L (ref 39–117)
BUN: 12 mg/dL (ref 6–23)
CO2: 24 mEq/L (ref 19–32)
Calcium: 9.5 mg/dL (ref 8.4–10.5)
Chloride: 106 mEq/L (ref 96–112)
Creatinine, Ser: 0.81 mg/dL (ref 0.4–1.2)
GFR calc Af Amer: >60 mL/min (ref >60)
GFR calc non Af Amer: >60 mL/min (ref >60)
Glucose, Bld: 118 mg/dL — ABNORMAL HIGH (ref 70–99)
Potassium: 3.9 mEq/L (ref 3.5–5.1)
Sodium: 138 mEq/L (ref 135–145)
Total Bilirubin: 0.4 mg/dL (ref 0.3–1.2)
Total Protein: 7.5 g/dL (ref 6.0–8.3)

## 2010-12-04 LAB — CBC
HCT: 42.8 % (ref 36.0–46.0)
Hemoglobin: 14.3 g/dL (ref 12.0–15.0)
MCHC: 33.3 g/dL (ref 30.0–36.0)
MCV: 87.7 fL (ref 78.0–100.0)
Platelets: 262 10*3/uL (ref 150–400)
RBC: 4.88 MIL/uL (ref 3.87–5.11)
RDW: 12.2 % (ref 11.5–15.5)
WBC: 6.7 10*3/uL (ref 4.0–10.5)

## 2010-12-04 LAB — HEMOCCULT GUIAC POC 1CARD (OFFICE): Fecal Occult Bld: POSITIVE

## 2010-12-04 LAB — DIFFERENTIAL
Basophils Absolute: 0.1 10*3/uL (ref 0.0–0.1)
Basophils Relative: 1 % (ref 0–1)
Eosinophils Absolute: 0.1 10*3/uL (ref 0.0–0.7)
Eosinophils Relative: 2 % (ref 0–5)
Lymphocytes Relative: 16 % (ref 12–46)
Lymphs Abs: 1 10*3/uL (ref 0.7–4.0)
Monocytes Absolute: 0.5 10*3/uL (ref 0.1–1.0)
Monocytes Relative: 7 % (ref 3–12)
Neutro Abs: 4.9 10*3/uL (ref 1.7–7.7)
Neutrophils Relative %: 75 % (ref 43–77)

## 2010-12-04 LAB — TYPE AND SCREEN
ABO/RH(D): B POS
Antibody Screen: NEGATIVE

## 2010-12-04 LAB — LIPASE, BLOOD: Lipase: 33 U/L (ref 11–59)

## 2010-12-04 LAB — ABO/RH: ABO/RH(D): B POS

## 2010-12-17 LAB — FECAL LACTOFERRIN, QUANT: Fecal Lactoferrin: POSITIVE

## 2010-12-17 LAB — DIFFERENTIAL
Eosinophils Relative: 1 % (ref 0–5)
Lymphocytes Relative: 4 % — ABNORMAL LOW (ref 12–46)
Monocytes Absolute: 0.5 10*3/uL (ref 0.1–1.0)
Monocytes Relative: 4 % (ref 3–12)
Neutro Abs: 12.3 10*3/uL — ABNORMAL HIGH (ref 1.7–7.7)

## 2010-12-17 LAB — CBC
HCT: 38.2 % (ref 36.0–46.0)
HCT: 42.6 % (ref 36.0–46.0)
Hemoglobin: 12.9 g/dL (ref 12.0–15.0)
Hemoglobin: 14.5 g/dL (ref 12.0–15.0)
MCHC: 33.8 g/dL (ref 30.0–36.0)
MCHC: 34 g/dL (ref 30.0–36.0)
MCV: 87.7 fL (ref 78.0–100.0)
Platelets: 223 10*3/uL (ref 150–400)
Platelets: 257 10*3/uL (ref 150–400)
RBC: 4.86 MIL/uL (ref 3.87–5.11)
RDW: 13.2 % (ref 11.5–15.5)
RDW: 13.3 % (ref 11.5–15.5)
WBC: 13.4 10*3/uL — ABNORMAL HIGH (ref 4.0–10.5)

## 2010-12-17 LAB — COMPREHENSIVE METABOLIC PANEL
ALT: 23 U/L (ref 0–35)
AST: 25 U/L (ref 0–37)
Albumin: 4.3 g/dL (ref 3.5–5.2)
Alkaline Phosphatase: 57 U/L (ref 39–117)
BUN: 9 mg/dL (ref 6–23)
CO2: 25 mEq/L (ref 19–32)
Calcium: 9.5 mg/dL (ref 8.4–10.5)
Chloride: 102 mEq/L (ref 96–112)
Creatinine, Ser: 0.72 mg/dL (ref 0.4–1.2)
GFR calc Af Amer: >60 mL/min (ref >60)
GFR calc non Af Amer: >60 mL/min (ref >60)
Glucose, Bld: 116 mg/dL — ABNORMAL HIGH (ref 70–99)
Potassium: 3.7 mEq/L (ref 3.5–5.1)
Sodium: 137 mEq/L (ref 135–145)
Total Bilirubin: 0.9 mg/dL (ref 0.3–1.2)
Total Protein: 7.7 g/dL (ref 6.0–8.3)

## 2010-12-17 LAB — CLOSTRIDIUM DIFFICILE EIA: C difficile Toxins A+B, EIA: 20

## 2011-01-15 ENCOUNTER — Ambulatory Visit: Payer: Self-pay | Admitting: Internal Medicine

## 2011-01-23 NOTE — Discharge Summary (Signed)
Jamie Levy, Jamie Levy              ACCOUNT NO.:  [id number]   MEDICAL RECORD NO.:  [id number]          PATIENT TYPE:  INP   LOCATION:  1333                         FACILITY:  [facility]   PHYSICIAN:  Iva Boop, MD,FACGDATE OF BIRTH:  11-05-1960   DATE OF ADMISSION:  03/28/2009  DATE OF DISCHARGE:  03/30/2009                               DISCHARGE SUMMARY   DISPOSITION:  Home in stable condition.   DISCHARGE MEDICATIONS:  Continue home medications including:  1. Ambien CR 12.5 mg p.r.n.  2. Vitamin D weekly.  3. Vytorin 10/80 mg daily.  4. Allegra 180 mg as needed.  5. Pristiq 50 mg daily.   In addition, the patient will take vancomycin 250 mg p.o. every 6 hours  for the next 12 days and Florastor twice daily for 1 month.   CONSULTATIONS:  None requested.   PROCEDURES:  None performed.   ADMITTING DIAGNOSES:  1. Diarrhea, presumed infectious in the setting of recent antibiotics.  2. Diverticulosis.  3. Hyperlipidemia.  4. Depression.   HOSPITAL COURSE:  Ms. Jamie Levy is a 50 year old female who was admitted  from our office on March 28, 2009 for diarrhea.  On March 07, 2009, the  patient was started on Cipro and Flagyl for presumed diverticulitis.  Her diarrhea began on the evening of March 27, 2009 (one day prior to  admission).  The patient reported fever to 101 as well as chills.  She  was having nausea and overall felt horrible.  The patient was admitted  to a medical bed under the services of Dr. Arlyce Dice who was on-call that  week at the hospital.  On admission, the patient was started on IV  fluids, given antiemetics, and started on oral vancomycin for presumed  C. difficile colitis.  Stool studies did in fact come back positive for  C. difficile colitis.  The patient improved quickly.  On admission, she  had a white count of 13.4 but the following day that normalized.  She  was afebrile throughout her hospital stay.  The patient did complain of  left maxillary pain.  She  has a history of chronic sinus problems and in  fact was scheduled to have right sinus surgery but that was cancelled  secondary to recent GI problems.  On March 30, 2009, the patient felt  much better, her diarrhea had greatly improved.  She was tolerating  solids.  The patient was stable for discharge.  Departing labs include a  white count of 5.9, hemoglobin 12.9, hematocrit 38.2, and platelets 223.  Her departing vital signs included a temperature of 97.8, heart rate 87,  and blood pressure 123/80.  The patient would continue on vancomycin for  a total of 2 weeks.  She would take Florastor for 30 days.  The patient  was given a followup appointment with Dr. Juanda Chance, her primary  gastroenterologist, on April 27, 2009 at 10:30.   DISCHARGE DIAGNOSES:  1. Clostridium difficile colitis, resolving.  2. Diverticulosis.  3. Hyperlipidemia.  4. Depression, controlled on medication.      Willette Cluster, NP      Baldo Ash  Sena Slate, MD,FACG  Electronically Signed    PG/MEDQ  D:  03/30/2009  T:  03/31/2009  Job:  [id number]

## 2011-02-15 ENCOUNTER — Encounter: Payer: Self-pay | Admitting: Internal Medicine

## 2011-02-16 ENCOUNTER — Ambulatory Visit: Payer: Self-pay | Admitting: Internal Medicine

## 2011-02-16 ENCOUNTER — Other Ambulatory Visit: Payer: Self-pay | Admitting: Internal Medicine

## 2011-02-16 ENCOUNTER — Ambulatory Visit (INDEPENDENT_AMBULATORY_CARE_PROVIDER_SITE_OTHER): Payer: PRIVATE HEALTH INSURANCE | Admitting: Internal Medicine

## 2011-02-16 ENCOUNTER — Encounter: Payer: Self-pay | Admitting: Internal Medicine

## 2011-02-16 ENCOUNTER — Other Ambulatory Visit (INDEPENDENT_AMBULATORY_CARE_PROVIDER_SITE_OTHER): Payer: PRIVATE HEALTH INSURANCE

## 2011-02-16 DIAGNOSIS — F3289 Other specified depressive episodes: Secondary | ICD-10-CM

## 2011-02-16 DIAGNOSIS — J329 Chronic sinusitis, unspecified: Secondary | ICD-10-CM

## 2011-02-16 DIAGNOSIS — C4490 Unspecified malignant neoplasm of skin, unspecified: Secondary | ICD-10-CM | POA: Insufficient documentation

## 2011-02-16 DIAGNOSIS — Z1239 Encounter for other screening for malignant neoplasm of breast: Secondary | ICD-10-CM

## 2011-02-16 DIAGNOSIS — K589 Irritable bowel syndrome without diarrhea: Secondary | ICD-10-CM

## 2011-02-16 DIAGNOSIS — E785 Hyperlipidemia, unspecified: Secondary | ICD-10-CM

## 2011-02-16 DIAGNOSIS — Z0000 Encounter for general adult medical examination without abnormal findings: Secondary | ICD-10-CM

## 2011-02-16 DIAGNOSIS — F329 Major depressive disorder, single episode, unspecified: Secondary | ICD-10-CM

## 2011-02-16 LAB — CBC WITH DIFFERENTIAL/PLATELET
Basophils Absolute: 0.1 10*3/uL (ref 0.0–0.1)
Eosinophils Absolute: 0.3 10*3/uL (ref 0.0–0.7)
Eosinophils Relative: 4.2 % (ref 0.0–5.0)
HCT: 38.7 % (ref 36.0–46.0)
Lymphs Abs: 0.9 10*3/uL (ref 0.7–4.0)
MCHC: 35 g/dL (ref 30.0–36.0)
MCV: 88.3 fl (ref 78.0–100.0)
Monocytes Absolute: 0.5 10*3/uL (ref 0.1–1.0)
Platelets: 277 10*3/uL (ref 150.0–400.0)
RDW: 13.2 % (ref 11.5–14.6)

## 2011-02-16 LAB — HEPATIC FUNCTION PANEL
AST: 20 U/L (ref 0–37)
Alkaline Phosphatase: 70 U/L (ref 39–117)
Bilirubin, Direct: 0 mg/dL (ref 0.0–0.3)
Total Bilirubin: 0.5 mg/dL (ref 0.3–1.2)

## 2011-02-16 LAB — LDL CHOLESTEROL, DIRECT: Direct LDL: 156.6 mg/dL

## 2011-02-16 LAB — LIPID PANEL: Total CHOL/HDL Ratio: 4

## 2011-02-16 LAB — COMPREHENSIVE METABOLIC PANEL
Alkaline Phosphatase: 70 U/L (ref 39–117)
Glucose, Bld: 114 mg/dL — ABNORMAL HIGH (ref 70–99)
Sodium: 144 mEq/L (ref 135–145)
Total Bilirubin: 0.5 mg/dL (ref 0.3–1.2)
Total Protein: 6.8 g/dL (ref 6.0–8.3)
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
## 2011-02-18 DIAGNOSIS — Z0000 Encounter for general adult medical examination without abnormal findings: Secondary | ICD-10-CM | POA: Insufficient documentation

## 2011-02-18 NOTE — Assessment & Plan Note (Signed)
Seems stable at this time. She will continue with present medication and bowel regulation with fiber.

## 2011-02-18 NOTE — Assessment & Plan Note (Signed)
Lab reveals LDL 156 - too high. Her med list has two statins: simvastatin and atorvastatin. Will need to check on which medication she is taking and make appropriate adjustments to attain better control.

## 2011-02-18 NOTE — Assessment & Plan Note (Signed)
Stable. No active lesions. She does follow with dermatology but will need to be established with a dermatologist that is in her new insurance network.

## 2011-02-18 NOTE — Assessment & Plan Note (Signed)
Admits to depression but has no plans to act on her suicidal ideation. "She would never do that to her boys."  Plan - continue under the care of Dr. Betti Cruz - she will take cymbalta at 30mg  dose as instructed. She reports she has been tried on many medications.           Counseled to consider talking therapy

## 2011-02-18 NOTE — Assessment & Plan Note (Signed)
Her current medical condition is stable. Her limited physical exam was normal. No indication for pelvic/pap having had TAH/BSO. She is current with her gastroenterologist. Lab results are in normal range except for lipids (see below). She will be scheduled for mammography. She will return for more complete exam in the near future.

## 2011-02-19 ENCOUNTER — Ambulatory Visit: Payer: Self-pay | Admitting: Internal Medicine

## 2011-02-26 ENCOUNTER — Telehealth: Payer: Self-pay | Admitting: *Deleted

## 2011-02-26 NOTE — Telephone Encounter (Signed)
Pt requesting lab results from 02/16/11. We can LMOM at  Pt's mobile also @ [phone]

## 2011-02-26 NOTE — Telephone Encounter (Signed)
Lab report letter going out today. The lab results look good except for an elevated LDL of 156. We show both simvastatin and lipitor on med list. Which is she taking? In either case, lipitor or simvastatin, the next step if already maximizing diet and exercise control is to add zetia.  We can discuss at OV

## 2011-02-28 NOTE — Telephone Encounter (Signed)
LMOM to inform patient. 

## 2011-03-01 NOTE — Telephone Encounter (Signed)
LMOM to inform Pt. 

## 2011-03-01 NOTE — Telephone Encounter (Signed)
OK then , elevated cholesterol explained by absence of meds. She can go with lipitor 40, crestor 20 or livalo 4mg 

## 2011-03-01 NOTE — Telephone Encounter (Signed)
Spoke w/pt. She has been off any cholesterol meds many months. Pt has new insurance plan. She would like names of cholesterol med options so she may review w/pharm/ins then will call us back with name of med she can afford.

## 2011-03-05 ENCOUNTER — Other Ambulatory Visit: Payer: Self-pay | Admitting: *Deleted

## 2011-03-05 ENCOUNTER — Telehealth: Payer: Self-pay | Admitting: Internal Medicine

## 2011-03-05 MED ORDER — ATORVASTATIN CALCIUM 40 MG PO TABS: 40.0000 mg | ORAL_TABLET | Freq: Every day | ORAL | Status: DC

## 2011-03-05 NOTE — Telephone Encounter (Signed)
Patient called lmovm requesting refill on lipitor 40mg . She would like this sent to [address]

## 2011-03-05 NOTE — Telephone Encounter (Signed)
Rx Done . 

## 2011-04-02 ENCOUNTER — Telehealth: Payer: Self-pay | Admitting: *Deleted

## 2011-04-02 DIAGNOSIS — F419 Anxiety disorder, unspecified: Secondary | ICD-10-CM

## 2011-04-02 NOTE — Telephone Encounter (Signed)
Pt continues to be concerned about her weight & hair loss. She feels this is not stress related and thinks it may be a physical problem. She wants to know if thyroid, WBC, Vit D and check for anemia was done? I only see CBC, hepatic and lipids. She also continues to cry daily but does not feel this is a "mental" problem. Would you like to order additional labs?

## 2011-04-02 NOTE — Telephone Encounter (Signed)
Spoke w/patient, She says hair comes out in "handfulls" when she washes it and continues to loose weight. She is aware that MD is back in the office Thursday and will wait for advisement. She is very worried about thyroid, ok for labs?

## 2011-04-03 NOTE — Telephone Encounter (Signed)
Ok for Vitamin D. TSH,FT4, FT3, B12 dx anxiety

## 2011-04-03 NOTE — Telephone Encounter (Signed)
Labs entered in Epic//lmovm for pt to have collected per MD

## 2011-04-06 ENCOUNTER — Other Ambulatory Visit (INDEPENDENT_AMBULATORY_CARE_PROVIDER_SITE_OTHER): Payer: PRIVATE HEALTH INSURANCE

## 2011-04-06 DIAGNOSIS — F411 Generalized anxiety disorder: Secondary | ICD-10-CM

## 2011-04-06 DIAGNOSIS — F419 Anxiety disorder, unspecified: Secondary | ICD-10-CM

## 2011-04-06 LAB — VITAMIN B12: Vitamin B-12: 474 pg/mL (ref 211–911)

## 2011-04-07 LAB — VITAMIN D 25 HYDROXY (VIT D DEFICIENCY, FRACTURES): Vit D, 25-Hydroxy: 40 ng/mL (ref 30–89)

## 2011-04-08 ENCOUNTER — Encounter: Payer: Self-pay | Admitting: Internal Medicine

## 2011-05-01 ENCOUNTER — Encounter: Payer: Self-pay | Admitting: Internal Medicine

## 2011-05-15 ENCOUNTER — Other Ambulatory Visit: Payer: Self-pay

## 2011-05-15 MED ORDER — VALACYCLOVIR HCL 1 G PO TABS: 1000.0000 mg | ORAL_TABLET | Freq: Two times a day (BID) | ORAL | Status: DC

## 2011-06-25 ENCOUNTER — Other Ambulatory Visit (INDEPENDENT_AMBULATORY_CARE_PROVIDER_SITE_OTHER): Payer: BC Managed Care – PPO

## 2011-06-25 ENCOUNTER — Encounter: Payer: Self-pay | Admitting: Endocrinology

## 2011-06-25 ENCOUNTER — Ambulatory Visit (INDEPENDENT_AMBULATORY_CARE_PROVIDER_SITE_OTHER): Payer: BC Managed Care – PPO | Admitting: Endocrinology

## 2011-06-25 VITALS — BP 122/82 | HR 72 | Temp 98.8°F | Ht 64.0 in | Wt 110.0 lb

## 2011-06-25 DIAGNOSIS — R110 Nausea: Secondary | ICD-10-CM

## 2011-06-25 LAB — BASIC METABOLIC PANEL
Calcium: 9.4 mg/dL (ref 8.4–10.5)
Chloride: 99 mEq/L (ref 96–112)
Creatinine, Ser: 0.8 mg/dL (ref 0.4–1.2)

## 2011-06-25 LAB — CBC WITH DIFFERENTIAL/PLATELET
Basophils Relative: 0.1 % (ref 0.0–3.0)
Eosinophils Relative: 0.2 % (ref 0.0–5.0)
Lymphocytes Relative: 8 % — ABNORMAL LOW (ref 12.0–46.0)
MCV: 88.8 fl (ref 78.0–100.0)
Neutrophils Relative %: 84.7 % — ABNORMAL HIGH (ref 43.0–77.0)
RBC: 4.42 Mil/uL (ref 3.87–5.11)
WBC: 10.1 10*3/uL (ref 4.5–10.5)

## 2011-06-25 MED ORDER — ONDANSETRON 4 MG PO TBDP: 4.0000 mg | ORAL_TABLET | Freq: Three times a day (TID) | ORAL | Status: AC | PRN

## 2011-06-25 MED ORDER — ONDANSETRON 4 MG PO TBDP: 4.0000 mg | ORAL_TABLET | Freq: Three times a day (TID) | ORAL | Status: DC | PRN

## 2011-06-25 NOTE — Patient Instructions (Addendum)
i have sent a prescription to your pharmacy, for an anti-nausea pill, to take as needed.   Blood tests, and an abdominal ultrasound are being requested for you today.  Then please call [phone] to hear each of your test results.  You will be prompted to enter the 9-digit "MRN" number that appears at the top left of this page, followed by #.  Then you will hear the message. Refer to a back to dr Juanda Chance.  you will receive a phone call, about a day and time for an appointment. (update: i left message on phone-tree:  rx as we discussed)
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
## 2011-06-28 ENCOUNTER — Ambulatory Visit
Admission: RE | Admit: 2011-06-28 | Discharge: 2011-06-28 | Disposition: A | Discharge status: Home / Self Care | Payer: BC Managed Care – PPO | Source: Ambulatory Visit | Attending: Endocrinology | Admitting: Endocrinology

## 2011-06-28 DIAGNOSIS — R110 Nausea: Secondary | ICD-10-CM

## 2011-07-04 ENCOUNTER — Telehealth: Payer: Self-pay | Admitting: *Deleted

## 2011-07-04 NOTE — Telephone Encounter (Signed)
Chemistry and CBC normal. U/S Abd - tiny gallstone and some sludge otherwise normal

## 2011-07-04 NOTE — Telephone Encounter (Signed)
Pt was seen by Dr Everardo All, sent for labs and u/s - Patient requesting results.

## 2011-07-06 NOTE — Telephone Encounter (Signed)
Left pt detailed VM

## 2011-07-09 ENCOUNTER — Ambulatory Visit (INDEPENDENT_AMBULATORY_CARE_PROVIDER_SITE_OTHER): Payer: BC Managed Care – PPO | Admitting: Internal Medicine

## 2011-07-09 DIAGNOSIS — G4730 Sleep apnea, unspecified: Secondary | ICD-10-CM

## 2011-07-09 DIAGNOSIS — R110 Nausea: Secondary | ICD-10-CM

## 2011-07-09 DIAGNOSIS — R634 Abnormal weight loss: Secondary | ICD-10-CM | POA: Insufficient documentation

## 2011-07-09 NOTE — Assessment & Plan Note (Signed)
Continued low grade nausea plus episode(s) of dark tarry stools raises concern for UGI issues: gastritis vs H/Pylori infection vs other.   Plan - she is advised to keep appointment with Dr. Juanda Chance.

## 2011-07-09 NOTE — Assessment & Plan Note (Signed)
Patient with greatest weight loss April '11-June '12. This was a period of high stress: unemployed; sick father; special needs teenager at home, etc. Since June '12 she has been much more stable. No evidence of metabolic derangement or oncologic problems.  Plan - see Dr. Juanda Chance for other problems that could affect eating           Maintain a healthy diet including general calorie supplement.

## 2011-07-09 NOTE — Assessment & Plan Note (Signed)
Incomplete data available. She is not doing well on current treatment. She never had follow-up consultation. She is concerned about anatomic changes that may be playing a role in her problem.  Plan - refer to pulmonary for sleep evaluation: review of previous study, current CPAP settings and need for titration study.

## 2011-07-09 NOTE — Progress Notes (Signed)
  Subjective:    Patient ID: Jamie Levy, female    DOB: 1960-10-06, 50 y.o.   MRN: [id number]  HPI [name] was seen 06/25/11 for abdominal pain and nausea. She had a normal Abdominal u/s except for some sludge in the gallbladder. Her labs were normal with a WBC 10. With left shift. The next day she had more symptoms and went to Urgent Care and was treated with z-pak with good results. She is feeling better.  Reviewed chart - she had a 15 Kg weight loss from June '11 to April '12 which was a period of great stress. She is doing better - her fiance and father are doing better, her college age sons are doing ok. She is working.   In regard to GI consultation for EGD: due to having chronic low grade nausea and dyspepsia plus an episode or two of melanotic stool she should keep scheduled appointment.  Sleep apnea - she was diagnosed several years ago and does not recall the name of the sleep physician who interpreted her test and order CPAP. She is not doing well with her current set-up; she still has significant somnolence; she has never had a follow-up consult or titration study.  I have reviewed the patient's medical history in detail and updated the computerized patient record.    Review of Systems System review is negative for any constitutional, cardiac, pulmonary, GI or neuro symptoms or complaints other than as described in the HPI.     Objective:   Physical Exam Vitals reviewed - see weight review in HPI Gen'l - slender white woman in no acute distress HEENT - [place]/at, C&S clear Pulm - normal respirations Cor - RRR        Assessment & Plan:

## 2011-07-24 ENCOUNTER — Encounter: Payer: Self-pay | Admitting: Internal Medicine

## 2011-07-24 ENCOUNTER — Ambulatory Visit: Payer: BC Managed Care – PPO | Admitting: Internal Medicine

## 2011-07-24 ENCOUNTER — Ambulatory Visit (INDEPENDENT_AMBULATORY_CARE_PROVIDER_SITE_OTHER): Payer: BC Managed Care – PPO | Admitting: Internal Medicine

## 2011-07-24 VITALS — BP 102/62 | HR 60 | Ht 64.0 in | Wt 113.8 lb

## 2011-07-24 DIAGNOSIS — R110 Nausea: Secondary | ICD-10-CM

## 2011-07-24 NOTE — Patient Instructions (Addendum)
You have been scheduled for an endoscopy. Please follow written instructions given to you at your visit today.  Dr Debby Bud

## 2011-07-24 NOTE — Progress Notes (Signed)
Jamie Levy 06-20-1961 MRN [id number]   History of Present Illness:  This is a 50 year old white female who complains of nausea without vomiting. She has abdominal discomfort and dyspepsia. She has lost weight from approximately 160 pounds to a current 113 pounds. She has been under a great deal of stress. Her upper abdominal ultrasound shows gallstones and sludge. She has a history of diverticulosis on a colonoscopy in 2006 and again in July 2010. She has a history of depression. There was an episode of dark stool recently. She does not smoke, drink alcohol or take NSAIDs. She takes Zofran with good response. Her last hemoglobin was 13.2. Her liver function tests were normal.   Past Medical History  Diagnosis Date  . Unspecified sleep apnea   . Irritable bowel syndrome   . Diarrhea of presumed infectious origin   . Diverticulosis of colon (without mention of hemorrhage)   . Abdominal pain, left lower quadrant   . Other and unspecified hyperlipidemia   . Depressive disorder, not elsewhere classified   . Unspecified sinusitis (chronic)   . Allergy   . 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 Abdominal hysterectomy 1997    BSo- Fibroids  . Elbow cystectomy   . Nasal sinus surgery     X 6    reports that she has never smoked. She has never used smokeless tobacco. She reports that she drinks alcohol. She reports that she does not use illicit drugs. family history includes Colon polyps in her father; Depression in her father; Heart disease in her father; Melanoma in her father; Ovarian cancer in her mother; and Prostate cancer in her father.  There is no history of Colon cancer. Allergies  Allergen Reactions  . Codeine     REACTION: vomiting  . Compoz (Diphenhydramine Hcl)   . Iohexol      Code: HIVES, Desc: pt developed one hive on rt upper arm and itching on upper lip after receiving omni 300, needs to be pre medicated, Onset  Date: [id number]   . Prochlorperazine Edisylate         Review of Systems: He denies dysphagia, odynophagia or shortness of breath or chest pain  The remainder of the 10 point ROS is negative except as outlined in H&P   Physical Exam: General appearance  Well developed, in no distress. Appears depressed Eyes- non icteric. HEENT nontraumatic, normocephalic. Mouth no lesions, tongue papillated, no cheilosis. Neck supple without adenopathy, thyroid not enlarged, no carotid bruits, no JVD. Lungs Clear to auscultation bilaterally. Cor normal S1, normal S2, regular rhythm, no murmur,  quiet precordium. Abdomen: Soft abdomen with normal active bowel sounds. No tenderness in upper abdomen. Lower abdomen unremarkable. Rectal: Hemoccult negative stool. Extremities no pedal edema. Skin no lesions. Neurological alert and oriented x 3. Psychological normal mood and affect.  Assessment and Plan:  Problem #1 Nonspecific nausea and dyspepsia associated with weight loss.  Patient is under great deal of stress. There is also a question of cholelithiasis on ultrasound with the otherwise normal-appearing gallbladder and common bile duct. We will proceed with an upper endoscopy and small bowel biopsy as well as for H. pylori testing. We will also start her Prilosec 20 mg daily for 2 weeks. Samples have been given. Her symptoms do not suggest acute biliary dysfunction but I would consider a HIDA scan at some point if her symptoms persist.   07/24/2011 Jamie Levy

## 2011-07-25 ENCOUNTER — Encounter: Payer: Self-pay | Admitting: Internal Medicine

## 2011-08-09 ENCOUNTER — Institutional Professional Consult (permissible substitution): Payer: BC Managed Care – PPO | Admitting: Pulmonary Disease

## 2011-09-07 ENCOUNTER — Other Ambulatory Visit: Payer: BC Managed Care – PPO | Admitting: Internal Medicine

## 2011-10-16 ENCOUNTER — Institutional Professional Consult (permissible substitution): Payer: BC Managed Care – PPO | Admitting: Pulmonary Disease

## 2011-11-08 ENCOUNTER — Other Ambulatory Visit: Payer: Self-pay | Admitting: Internal Medicine

## 2011-12-10 ENCOUNTER — Telehealth: Payer: Self-pay

## 2011-12-10 DIAGNOSIS — G4730 Sleep apnea, unspecified: Secondary | ICD-10-CM

## 2011-12-10 NOTE — Telephone Encounter (Signed)
[facility] notified.  

## 2011-12-10 NOTE — Telephone Encounter (Signed)
Patient would like the referral for the [facility] Department of Neurology: Sleep Disorder Center Fax: [phone] Attn: Dahlia Client

## 2011-12-10 NOTE — Telephone Encounter (Signed)
Patient last seen in October at which time a request for pulmonary/sleep medicine consult was requested. Did she ever get a call about an appointment? Would she be interested in seeing one of our sleep specialist or other sleep specialists in [place] - Dr. Marylou Flesher or Dr. Earl Gala? If she definitely feels [facility] is the place for her we will be happy to start the referral process rollling.  \ Let me know what she chooses to do.

## 2011-12-10 NOTE — Telephone Encounter (Signed)
Pt called very tearful, stating that sleep apnea is having a severely negative impact on her daily life and contributing to depression. Pt is requesting a referral to [facility] sleep disorder center ASAP, please advise.

## 2011-12-13 ENCOUNTER — Telehealth: Payer: Self-pay | Admitting: Internal Medicine

## 2011-12-13 NOTE — Telephone Encounter (Signed)
I agree with the assessment and advice

## 2011-12-13 NOTE — Telephone Encounter (Signed)
Patient reports she ate an entire can of red beets last night.  This am she has diarrhea and it is red,  She denies any other symptoms abdominal pain, nausea or vomiting, fever.  She is advised that diarrhea is most likely from the beets and it is unlikely that the red is blood.  She is advised to call back if the diarrhea or the red stools continue or if she developes any other symptoms as above, or report to the ER.

## 2011-12-15 ENCOUNTER — Encounter (HOSPITAL_COMMUNITY): Payer: Self-pay | Admitting: *Deleted

## 2011-12-15 ENCOUNTER — Emergency Department (HOSPITAL_COMMUNITY)
Admission: EM | Admit: 2011-12-15 | Discharge: 2011-12-15 | Disposition: A | Discharge status: Home Health | Payer: BC Managed Care – PPO | Attending: Emergency Medicine | Admitting: Emergency Medicine

## 2011-12-15 DIAGNOSIS — R110 Nausea: Secondary | ICD-10-CM | POA: Insufficient documentation

## 2011-12-15 DIAGNOSIS — Z79899 Other long term (current) drug therapy: Secondary | ICD-10-CM | POA: Insufficient documentation

## 2011-12-15 DIAGNOSIS — R197 Diarrhea, unspecified: Secondary | ICD-10-CM

## 2011-12-15 DIAGNOSIS — K589 Irritable bowel syndrome without diarrhea: Secondary | ICD-10-CM | POA: Insufficient documentation

## 2011-12-15 DIAGNOSIS — G4730 Sleep apnea, unspecified: Secondary | ICD-10-CM | POA: Insufficient documentation

## 2011-12-15 LAB — BASIC METABOLIC PANEL
CO2: 26 mEq/L (ref 19–32)
Calcium: 9.7 mg/dL (ref 8.4–10.5)
Creatinine, Ser: 0.67 mg/dL (ref 0.50–1.10)
GFR calc non Af Amer: >90 mL/min (ref >90)
Glucose, Bld: 97 mg/dL (ref 70–99)
Sodium: 138 mEq/L (ref 135–145)

## 2011-12-15 LAB — CBC
Hemoglobin: 13.6 g/dL (ref 12.0–15.0)
MCH: 29.3 pg (ref 26.0–34.0)
MCHC: 34.3 g/dL (ref 30.0–36.0)
MCV: 85.6 fL (ref 78.0–100.0)

## 2011-12-15 MED ORDER — SODIUM CHLORIDE 0.9 % IV BOLUS (SEPSIS)
1000.0000 mL | Freq: Once | INTRAVENOUS | Status: AC
  Administered 2011-12-15: 1000 mL via INTRAVENOUS

## 2011-12-15 MED ORDER — ONDANSETRON HCL 4 MG/2ML IJ SOLN
4.0000 mg | Freq: Once | INTRAMUSCULAR | Status: AC
  Administered 2011-12-15: 4 mg via INTRAVENOUS
  Filled 2011-12-15: qty 2

## 2011-12-15 NOTE — ED Notes (Signed)
Pt aware of the need for a stool sample. 

## 2011-12-15 NOTE — ED Notes (Signed)
Discharge instructions reviewed w/ pt., no prescriptions provided at discharge. Work note per Dr. Patria Mane for three days next week.

## 2011-12-15 NOTE — ED Notes (Signed)
Pt c/o diarrhea since this past Wed. Pt has hx of c. Difficile and diverticulosis and diverticulitis.

## 2011-12-15 NOTE — ED Provider Notes (Signed)
History     CSN: [id number]  Arrival date & time 12/15/11  4098   First MD Initiated Contact with Patient 12/15/11 [phone]      Chief Complaint  Patient presents with  . Diarrhea    x 4 days     The history is provided by the patient.   the patient reports 4 days of nonbloody diarrhea.  She reports this mainly occurs in the morning and is very "watery".  She's had no recent travel or antibiotics.  She does have a prior history of C. difficile colitis.  She works at a pre-K. Chartered certified accountant.  She denies fevers or chills.  She's had nausea without vomiting.  She's had mild decrease oral intake.  She reports occasional crampy abdominal pain without focality.  Nothing worsens her symptoms.  Nothing improves her symptoms.  Her symptoms are constant.  Her discomfort is mild in severity  Past Medical History  Diagnosis Date  . Unspecified sleep apnea   . Irritable bowel syndrome   . Diarrhea of presumed infectious origin   . Diverticulosis of colon (without mention of hemorrhage)   . Abdominal pain, left lower quadrant   . Other and unspecified hyperlipidemia   . Depressive disorder, not elsewhere classified   . Unspecified sinusitis (chronic)   . Allergy     Past Surgical History  Procedure Date  . Abdominal hysterectomy 1997    BSo- Fibroids  . Elbow cystectomy   . Nasal sinus surgery     X 6  . Colon surgery     Family History  Problem Relation Age of Onset  . Ovarian cancer Mother   . Prostate cancer Father   . Colon polyps Father   . Heart disease Father     CAD/CABG/Stents  . Melanoma Father   . Depression Father     suicidal in the past  . Colon cancer Neg Hx     History  Substance Use Topics  . Smoking status: Never Smoker   . Smokeless tobacco: Never Used  . Alcohol Use: Yes     very rare, avoids alcohol when depressed    OB History    Grav Para Term Preterm Abortions TAB SAB Ect Mult Living                  Review of Systems  Gastrointestinal: Positive  for diarrhea.  All other systems reviewed and are negative.    Allergies  Compazine; Codeine; Iohexol; and Prochlorperazine edisylate  Home Medications   Current Outpatient Rx  Name Route Sig Dispense Refill  . ATORVASTATIN CALCIUM 40 MG PO TABS  take 1 tablet by mouth once daily 30 tablet 5  . BIOTIN 10 MG PO TABS Oral Take by mouth.      Marland Kitchen VITAMIN D 1000 UNITS PO TABS Oral Take 1,000 Units by mouth daily.      Marland Kitchen CLONAZEPAM 0.5 MG PO TABS Oral Take 0.5 mg by mouth 2 (two) times daily as needed.      Marland Kitchen VITAMIN B-12 ER 1000 MCG PO TBCR Oral Take by mouth. Take 1 tab by mouth daily     . VILAZODONE HCL 10 MG PO TABS Oral Take 5 mg by mouth daily.    Marland Kitchen VITAMIN C 500 MG PO TABS Oral Take 500 mg by mouth daily. Take 1 tab by mouth daily     . ZOLPIDEM TARTRATE 10 MG PO TABS Oral Take 10 mg by mouth at bedtime as needed.  BP 125/88  Temp(Src) 98.4 F (36.9 C) (Oral)  Resp 15  Ht 5\' 4"  (1.626 m)  Wt 124 lb 1.9 oz (56.3 kg)  BMI 21.30 kg/m2  SpO2 100%  Physical Exam  Nursing note and vitals reviewed. Constitutional: She is oriented to person, place, and time. She appears well-developed and well-nourished. No distress.  HENT:  Head: Normocephalic and atraumatic.  Eyes: EOM are normal.  Neck: Normal range of motion.  Cardiovascular: Normal rate, regular rhythm and normal heart sounds.   Pulmonary/Chest: Effort normal and breath sounds normal.  Abdominal: Soft. She exhibits no distension. There is no tenderness.  Musculoskeletal: Normal range of motion.  Neurological: She is alert and oriented to person, place, and time.  Skin: Skin is warm and dry.  Psychiatric: She has a normal mood and affect. Judgment normal.    ED Course  Procedures (including critical care time)   Labs Reviewed  CBC  BASIC METABOLIC PANEL  CLOSTRIDIUM DIFFICILE BY PCR   No results found.   1. Nausea   2. Diarrhea       MDM  Diarrhea.  C. difficile PCR sent given prior history of  such.  She feels much better after fluids antibiotics.  Her labs are normal.  C. difficile is pending.  She'll followup with her gastroenterologist.  She understands to return to the ER for new or worsening symptoms        Lyanne Co, MD 12/15/11 [phone]

## 2011-12-18 ENCOUNTER — Institutional Professional Consult (permissible substitution): Payer: BC Managed Care – PPO | Admitting: Pulmonary Disease

## 2012-04-14 ENCOUNTER — Telehealth: Payer: Self-pay | Admitting: *Deleted

## 2012-04-14 NOTE — Telephone Encounter (Signed)
Patient called concerned of medication she is on LIPITOR and has been reading how this affects women and would rather not take anymore. Request to be on generic simvastatin or pravastatin. Patient connected to scheduler to make yearly appt. With you to discuss this.

## 2012-04-17 ENCOUNTER — Telehealth: Payer: Self-pay | Admitting: *Deleted

## 2012-04-17 NOTE — Telephone Encounter (Signed)
PATIENT HAS APPT. SCHEDULED WITH YOU ON 04/24/2012 FOR YEARLY PHYSICAL AND WILL TALK WITH DR. Debby Bud CONCERNING MEDICATION LIPITOR.

## 2012-04-24 ENCOUNTER — Encounter: Payer: Self-pay | Admitting: Internal Medicine

## 2012-04-24 ENCOUNTER — Ambulatory Visit (INDEPENDENT_AMBULATORY_CARE_PROVIDER_SITE_OTHER): Payer: BC Managed Care – PPO | Admitting: Internal Medicine

## 2012-04-24 ENCOUNTER — Other Ambulatory Visit (INDEPENDENT_AMBULATORY_CARE_PROVIDER_SITE_OTHER): Payer: BC Managed Care – PPO

## 2012-04-24 VITALS — BP 108/80 | HR 59 | Temp 98.1°F | Resp 16 | Wt 128.0 lb

## 2012-04-24 DIAGNOSIS — E785 Hyperlipidemia, unspecified: Secondary | ICD-10-CM

## 2012-04-24 DIAGNOSIS — J309 Allergic rhinitis, unspecified: Secondary | ICD-10-CM

## 2012-04-24 DIAGNOSIS — F329 Major depressive disorder, single episode, unspecified: Secondary | ICD-10-CM

## 2012-04-24 DIAGNOSIS — J329 Chronic sinusitis, unspecified: Secondary | ICD-10-CM

## 2012-04-24 DIAGNOSIS — F3289 Other specified depressive episodes: Secondary | ICD-10-CM

## 2012-04-24 DIAGNOSIS — G4730 Sleep apnea, unspecified: Secondary | ICD-10-CM

## 2012-04-24 LAB — CBC WITH DIFFERENTIAL/PLATELET
Basophils Absolute: 0.1 10*3/uL (ref 0.0–0.1)
Eosinophils Absolute: 0.5 10*3/uL (ref 0.0–0.7)
HCT: 39.7 % (ref 36.0–46.0)
Hemoglobin: 13.1 g/dL (ref 12.0–15.0)
Lymphs Abs: 1 10*3/uL (ref 0.7–4.0)
MCHC: 32.9 g/dL (ref 30.0–36.0)
MCV: 88.9 fl (ref 78.0–100.0)
Monocytes Absolute: 0.5 10*3/uL (ref 0.1–1.0)
Neutro Abs: 2.4 10*3/uL (ref 1.4–7.7)
Platelets: 237 10*3/uL (ref 150.0–400.0)
RDW: 13.6 % (ref 11.5–14.6)

## 2012-04-24 LAB — LIPID PANEL
Cholesterol: 252 mg/dL — ABNORMAL HIGH (ref 0–200)
Total CHOL/HDL Ratio: 3
VLDL: 16.8 mg/dL (ref 0.0–40.0)

## 2012-04-24 LAB — HEPATIC FUNCTION PANEL
AST: 21 U/L (ref 0–37)
Albumin: 4.1 g/dL (ref 3.5–5.2)
Alkaline Phosphatase: 53 U/L (ref 39–117)
Bilirubin, Direct: 0 mg/dL (ref 0.0–0.3)
Total Bilirubin: 0.5 mg/dL (ref 0.3–1.2)

## 2012-04-24 LAB — LDL CHOLESTEROL, DIRECT: Direct LDL: 158.6 mg/dL

## 2012-04-24 MED ORDER — FLUTICASONE PROPIONATE 50 MCG/ACT NA SUSP: 1.0000 | Freq: Every day | NASAL | Status: DC

## 2012-04-24 NOTE — Assessment & Plan Note (Signed)
Patient w/ h/o OSA. Still symptomatic.  Plan She will let us know when she wants to complete referral to [facility].

## 2012-04-24 NOTE — Assessment & Plan Note (Signed)
reveiwed NEJM article re: risk of incipient diabetes in patients on Statin - low absolute risk. She has high risk for CAD with positive family history and familial hyperlipidemia. Last LDL 165 while on lipitor 40 mg  Plan Reassured that benefit outweighs risk in regard to treatment with Statins and risk of diabetes  Lipid panel (off lipitor for 1 week) with recommendations to follow.

## 2012-04-24 NOTE — Assessment & Plan Note (Signed)
Followed at [facility]. Has used flonase in the past  Plan - resume flonase
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
## 2012-04-24 NOTE — Assessment & Plan Note (Addendum)
Continue depression. Has had suicidal ideation but no concrete plan and states she would never kill herself. There are many significant external stressors: son with Asperger's syndrome, elderly father living with her, work stress.  Plan  Per Dr. Betti Cruz  Consider cognitive behavioral therapy.   For severe unresponsive depression she should discuss ECT with Dr. Betti Cruz

## 2012-04-25 ENCOUNTER — Encounter: Payer: Self-pay | Admitting: Internal Medicine

## 2012-04-25 MED ORDER — PRAVASTATIN SODIUM 80 MG PO TABS: 80.0000 mg | ORAL_TABLET | Freq: Every day | ORAL | Status: DC

## 2012-05-19 ENCOUNTER — Encounter: Payer: Self-pay | Admitting: Internal Medicine

## 2012-05-19 ENCOUNTER — Ambulatory Visit (INDEPENDENT_AMBULATORY_CARE_PROVIDER_SITE_OTHER): Payer: BC Managed Care – PPO | Admitting: Internal Medicine

## 2012-05-19 VITALS — BP 106/80 | HR 70 | Temp 97.2°F | Resp 16 | Wt 128.0 lb

## 2012-05-19 DIAGNOSIS — R21 Rash and other nonspecific skin eruption: Secondary | ICD-10-CM

## 2012-05-19 MED ORDER — CLOTRIMAZOLE-BETAMETHASONE 1-0.05 % EX CREA: TOPICAL_CREAM | Freq: Two times a day (BID) | CUTANEOUS | Status: DC

## 2012-05-19 NOTE — Patient Instructions (Addendum)
Facial rash - not sure what this is but it is not a fever blister, it is not an infection. It may be an eczema, it could be fungal. Plan - lotrisone apply twice a day. If you get skin redness or irritation stop. If you do not see any improvement in 5 days stop.  Cold - no evidence of a bacterial infection. Plan Vitamin C 1500 mg daily  Ecchinacea - dose as instructed on the package  Hydrate  Tylenol for fever or aches.  Rest as possible

## 2012-05-20 DIAGNOSIS — R21 Rash and other nonspecific skin eruption: Secondary | ICD-10-CM | POA: Insufficient documentation

## 2012-05-20 NOTE — Progress Notes (Signed)
  Subjective:    Patient ID: Jamie Levy, female    DOB: [birth year], 51 y.o.   MRN: [id number]  HPI Ms. Jamie Levy is seen acutely for a persistent rash at the left side of her mouth. This is a chronic reoccurring rash with small raised red bumps but no drainage, no pain. She does have itching when it flares. No known history of HSV. No new contact allergens.  PMH, FamHx and SocHx reviewed for any changes and relevance.  Current Outpatient Prescriptions on File Prior to Visit  Medication Sig Dispense Refill  . Biotin 10 MG TABS Take by mouth.        . cholecalciferol (VITAMIN D) 1000 UNITS tablet Take 1,000 Units by mouth daily.        . clonazePAM (KLONOPIN) 0.5 MG tablet Take 0.5 mg by mouth 2 (two) times daily as needed.        . Cyanocobalamin (VITAMIN B-12 CR) 1000 MCG TBCR Take by mouth. Take 1 tab by mouth daily       . fluticasone (FLONASE) 50 MCG/ACT nasal spray Place 1 spray into the nose daily. 1 spray to each nostril daily  16 g  6  . pravastatin (PRAVACHOL) 80 MG tablet Take 1 tablet (80 mg total) by mouth daily.  90 tablet  3  . vitamin C (ASCORBIC ACID) 500 MG tablet Take 500 mg by mouth daily. Take 1 tab by mouth daily       . zolpidem (AMBIEN) 10 MG tablet Take 10 mg by mouth at bedtime as needed.        . Vilazodone HCl (VIIBRYD) 10 MG TABS Take 5 mg by mouth daily.          Review of Systems System review is negative for any constitutional, cardiac, pulmonary, GI or neuro symptoms or complaints other than as described in the HPI.     Objective:   Physical Exam Filed Vitals:   05/19/12 1622  BP: 106/80  Pulse: 70  Temp: 97.2 F (36.2 C)  Resp: 16   Cor- RRR Pulm - normal respirations Derm- 2 cm sized area left corner of mouth/chin with mild erythematous 1mm lesions that have been excoriated.       Assessment & Plan:

## 2012-05-20 NOTE — Assessment & Plan Note (Signed)
Chronic recurrent rash at left corner of mouth that can be pruritic. No evidence of infection. Does not appear c/w HSV or shingles.  Plan  Trial of lotrisone bid to area. She is instructed that if there is any irritation from lotrisone to stop, if there is no improvement in 5 days to stop and will need dermatology consult. (states she has appt for December)

## 2012-05-24 ENCOUNTER — Emergency Department (HOSPITAL_COMMUNITY): Payer: BC Managed Care – PPO

## 2012-05-24 ENCOUNTER — Emergency Department (HOSPITAL_COMMUNITY)
Admission: EM | Admit: 2012-05-24 | Discharge: 2012-05-24 | Disposition: A | Discharge status: Home / Self Care | Payer: BC Managed Care – PPO | Attending: Emergency Medicine | Admitting: Emergency Medicine

## 2012-05-24 DIAGNOSIS — G4730 Sleep apnea, unspecified: Secondary | ICD-10-CM | POA: Insufficient documentation

## 2012-05-24 DIAGNOSIS — W208XXA Other cause of strike by thrown, projected or falling object, initial encounter: Secondary | ICD-10-CM | POA: Insufficient documentation

## 2012-05-24 DIAGNOSIS — Y998 Other external cause status: Secondary | ICD-10-CM | POA: Insufficient documentation

## 2012-05-24 DIAGNOSIS — Y92009 Unspecified place in unspecified non-institutional (private) residence as the place of occurrence of the external cause: Secondary | ICD-10-CM | POA: Insufficient documentation

## 2012-05-24 DIAGNOSIS — S61209A Unspecified open wound of unspecified finger without damage to nail, initial encounter: Secondary | ICD-10-CM | POA: Insufficient documentation

## 2012-05-24 DIAGNOSIS — Y93H9 Activity, other involving exterior property and land maintenance, building and construction: Secondary | ICD-10-CM | POA: Insufficient documentation

## 2012-05-24 DIAGNOSIS — K589 Irritable bowel syndrome without diarrhea: Secondary | ICD-10-CM | POA: Insufficient documentation

## 2012-05-24 DIAGNOSIS — J069 Acute upper respiratory infection, unspecified: Secondary | ICD-10-CM | POA: Insufficient documentation

## 2012-05-24 DIAGNOSIS — S61311A Laceration without foreign body of left index finger with damage to nail, initial encounter: Secondary | ICD-10-CM

## 2012-05-24 MED ORDER — TETANUS-DIPHTH-ACELL PERTUSSIS 5-2.5-18.5 LF-MCG/0.5 IM SUSP
0.5000 mL | Freq: Once | INTRAMUSCULAR | Status: AC
  Administered 2012-05-24: 0.5 mL via INTRAMUSCULAR
  Filled 2012-05-24: qty 0.5

## 2012-05-24 MED ORDER — CEPHALEXIN 250 MG PO CAPS: 250.0000 mg | ORAL_CAPSULE | Freq: Four times a day (QID) | ORAL | Status: AC

## 2012-05-24 NOTE — ED Notes (Signed)
Pt c/o sore throat that started on Wednesday pain 10/10. Pt sts she drop a large stone on first digit on left hand.No LOC.VSS.pwd

## 2012-05-24 NOTE — ED Provider Notes (Signed)
History     CSN: [id number]  Arrival date & time 05/24/12  1231   First MD Initiated Contact with Patient 05/24/12 1238      Chief Complaint  Patient presents with  . Sore Throat  . Finger Injury    (Consider location/radiation/quality/duration/timing/severity/associated sxs/prior treatment) HPI Hx from pt. Jamie Levy is a 51 y.o. female who presents with complaint of laceration to her left index finger and URI symptoms.  She states that she was working in her yard today on landscaping when she dropped a large rock on the dorsal distal aspect of her left index finger. She had immediate pain to the area and noted significant bleeding. She denies any numbness in the extremity. She denies difficulty moving the finger. She is unsure of her last tetanus but thinks it may have been more than 5 years ago.  Patient also presents with URI like symptoms. 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 She began to have a sore throat on Wednesday which was accompanied by cough which has been nonproductive and congestion. She is unsure if she's had a fever. She has not had chills. She denies any associated nausea, vomiting, diarrhea. She has not had any chest pain or shortness of breath. She denies any difficulty swallowing although notes it is very painful to swallow.  Past Medical History  Diagnosis Date  . Unspecified sleep apnea   . Irritable bowel syndrome   . Diarrhea of presumed infectious origin   . Diverticulosis of colon (without mention of hemorrhage)   . Abdominal pain, left lower quadrant   . Other and unspecified hyperlipidemia   . Depressive disorder, not elsewhere classified   . Unspecified sinusitis (chronic)   . Allergy     Past Surgical History  Procedure Date  . Abdominal hysterectomy 1997    BSo- Fibroids  . Elbow cystectomy   . Nasal sinus surgery     X 6  . Colon surgery     Family History  Problem  Relation Age of Onset  . Ovarian cancer Mother   . Prostate cancer Father   . Colon polyps Father   . Heart disease Father     CAD/CABG/Stents  . Melanoma Father   . Depression Father     suicidal in the past  . Colon cancer Neg Hx     History  Substance Use Topics  . Smoking status: Never Smoker   . Smokeless tobacco: Never Used  . Alcohol Use: Yes     very rare, avoids alcohol when depressed    OB History    Grav Para Term Preterm Abortions TAB SAB Ect Mult Living                  Review of Systems as per history of present illness  Allergies  Compazine; Codeine; Iohexol; and Prochlorperazine edisylate  Home Medications   Current Outpatient Rx  Name Route Sig Dispense Refill  . BIOTIN 10 MG PO TABS Oral Take by mouth.      Marland Kitchen VITAMIN D 1000 UNITS PO TABS Oral Take 1,000 Units by mouth daily.      Marland Kitchen CLONAZEPAM 0.5 MG PO TABS Oral Take 0.5 mg by mouth 2 (two) times daily as needed.      Marland Kitchen VITAMIN B-12 ER 1000 MCG PO TBCR Oral Take by mouth. Take 1 tab by mouth daily     . FLUTICASONE PROPIONATE 50 MCG/ACT NA  SUSP Nasal Place 1 spray into the nose daily. 1 spray to each nostril daily 16 g 6  . PRAVASTATIN SODIUM 80 MG PO TABS Oral Take 1 tablet (80 mg total) by mouth daily. 90 tablet 3  . VILAZODONE HCL 10 MG PO TABS Oral Take 5 mg by mouth daily.    Marland Kitchen VITAMIN C 500 MG PO TABS Oral Take 500 mg by mouth daily. Take 1 tab by mouth daily     . ZOLPIDEM TARTRATE 10 MG PO TABS Oral Take 10 mg by mouth at bedtime as needed.      . CEPHALEXIN 250 MG PO CAPS Oral Take 1 capsule (250 mg total) by mouth 4 (four) times daily. 28 capsule 0  . CLOTRIMAZOLE-BETAMETHASONE 1-0.05 % EX CREA Topical Apply topically 2 (two) times daily. 30 g 0    BP 134/84  Pulse 78  Temp 98.6 F (37 C) (Oral)  Resp 18  SpO2 100%  Physical Exam  Nursing note and vitals reviewed. Constitutional: She appears well-developed and well-nourished. No distress.  HENT:  Head: Normocephalic and  atraumatic.       Tympanic membranes normal bilaterally, sinuses nontender to palpation, oropharynx clear with no significant posterior oropharyngeal erythema or exudate  Eyes: EOM are normal. Pupils are equal, round, and reactive to light.  Neck: Normal range of motion. Neck supple.  Cardiovascular: Normal rate, regular rhythm and normal heart sounds.   Pulmonary/Chest: Effort normal and breath sounds normal.  Abdominal: Soft. Bowel sounds are normal. There is no tenderness. There is no rebound and no guarding.  Musculoskeletal: Normal range of motion.       Hands:      Laceration to skin just at the proximal edge of the nailbed as diagrammed. The wound does still continue to bleed slightly. There does not appear to be a laceration through the nail itself. Patient has full flexion and extension of the finger without difficulty. She is neurovascularly intact with sensory intact to light touch. Capillary refill less than 2 seconds.  Lymphadenopathy:    She has no cervical adenopathy.  Neurological: She is alert.  Skin: Skin is warm and dry. She is not diaphoretic.  Psychiatric: She has a normal mood and affect.    ED Course  Procedures (including critical care time)  LACERATION REPAIR Performed by: Grant Fontana Authorized by: Grant Fontana Consent: Verbal consent obtained. Risks and benefits: risks, benefits and alternatives were discussed Consent given by: patient Patient identity confirmed: provided demographic data Prepped and Draped in normal sterile fashion Wound explored  Laceration Location: L index finger  Laceration Length: 0.5cm  No Foreign Bodies seen or palpated  Anesthesia: Digital block   Local anesthetic: lidocaine 2% without epinephrine  Anesthetic total: 4 ml  Irrigation method: syringe Amount of cleaning: standard  Skin closure: dermabond  Patient tolerance: Patient tolerated the procedure well with no immediate complications.    Labs  Reviewed  RAPID STREP SCREEN   Dg Hand Complete Left  05/24/2012  *RADIOLOGY REPORT*  Clinical Data: Injured left hand, specifically, laceration involving the distal aspect of the index finger.  LEFT HAND - COMPLETE 3+ VIEW  Comparison: Left thumb x-rays 03/01/2009.  Findings: Soft tissue laceration involving the distal tuft of the index finger. No evidence of acute or subacute fracture or dislocation.  Well-preserved joint spaces.  Well-preserved bone mineral density.  No intrinsic osseous abnormalities.  No opaque foreign body in the soft tissues.  IMPRESSION: No osseous abnormality.  No opaque foreign body.  Original Report Authenticated By: Arnell Sieving, M.D.      1. Laceration of left index finger without foreign body with damage to nail   2. URI (upper respiratory infection)       MDM  Patient presents with complaint of laceration to her distal finger. X-rays reviewed, no evidence of fracture. She does have a laceration noted to the skin just below the proximal edge of the nailbed as well as to the side of the nailbed. There fortunately appears to be no obvious involvement of the nail itself. Bleeding was controlled with a tourniquet and quick clot. The area was repaired using Dermabond. She was counseled that there is a possibility that she may still lose the nail. She emphasized that she did not wish to have the nail removed today. Her tetanus was updated. We will prescribe her Keflex for the wound.  Her upper respiratory symptoms seem most consistent with viral syndrome. A rapid strep was performed in triage which was negative. She was instructed on symptomatic treatment and to followup with her PCP if not improving. Reasons to return to the ED discussed.       Grant Fontana, PA-C 05/24/12 1639

## 2012-05-25 NOTE — ED Provider Notes (Signed)
Medical screening examination/treatment/procedure(s) were performed by non-physician practitioner and as supervising physician I was immediately available for consultation/collaboration.  Donnetta Hutching, MD 05/25/12 0730

## 2012-05-27 ENCOUNTER — Encounter: Payer: Self-pay | Admitting: Internal Medicine

## 2012-05-28 ENCOUNTER — Ambulatory Visit (INDEPENDENT_AMBULATORY_CARE_PROVIDER_SITE_OTHER): Payer: BC Managed Care – PPO | Admitting: Internal Medicine

## 2012-05-28 ENCOUNTER — Encounter: Payer: Self-pay | Admitting: Internal Medicine

## 2012-05-28 VITALS — BP 106/60 | HR 67 | Temp 98.5°F | Resp 16 | Wt 128.0 lb

## 2012-05-28 DIAGNOSIS — S61319A Laceration without foreign body of unspecified finger with damage to nail, initial encounter: Secondary | ICD-10-CM

## 2012-05-28 DIAGNOSIS — S61209A Unspecified open wound of unspecified finger without damage to nail, initial encounter: Secondary | ICD-10-CM

## 2012-05-31 NOTE — Progress Notes (Signed)
  Subjective:    Patient ID: Jamie Levy, female    DOB: 1960/10/06, 51 y.o.   MRN: [id number]  HPI Mrs. Tresa Endo was working in her yard moving large stones and dropped one on the index finger left hand. There was quite a lot of pain and bleeding thus she went to the ED - records, x-rays reviewed. She had a crush laceration of the finger at the nail bed. No fracture on x-ray. The wound was flushed then sealed with Dermabond, dressing was placed. She was given a tetanus shot Tdap. She was started on keflex altough there was no evidence of infection.  She c/o continued sore throat. Strep screen was negative.   PMH, FamHx and SocHx reviewed for any changes and relevance.  Current Outpatient Prescriptions on File Prior to Visit  Medication Sig Dispense Refill  . Biotin 10 MG TABS Take by mouth.        . cephALEXin (KEFLEX) 250 MG capsule Take 1 capsule (250 mg total) by mouth 4 (four) times daily.  28 capsule  0  . cholecalciferol (VITAMIN D) 1000 UNITS tablet Take 1,000 Units by mouth daily.        . clonazePAM (KLONOPIN) 0.5 MG tablet Take 0.5 mg by mouth 2 (two) times daily as needed.        . clotrimazole-betamethasone (LOTRISONE) cream Apply topically 2 (two) times daily.  30 g  0  . Cyanocobalamin (VITAMIN B-12 CR) 1000 MCG TBCR Take by mouth. Take 1 tab by mouth daily       . fluticasone (FLONASE) 50 MCG/ACT nasal spray Place 1 spray into the nose daily. 1 spray to each nostril daily  16 g  6  . pravastatin (PRAVACHOL) 80 MG tablet Take 1 tablet (80 mg total) by mouth daily.  90 tablet  3  . Vilazodone HCl (VIIBRYD) 10 MG TABS Take 5 mg by mouth daily.      . vitamin C (ASCORBIC ACID) 500 MG tablet Take 500 mg by mouth daily. Take 1 tab by mouth daily       . zolpidem (AMBIEN) 10 MG tablet Take 10 mg by mouth at bedtime as needed.           Review of Systems System review is negative for any constitutional, cardiac, pulmonary, GI or neuro symptoms or complaints other than as described  in the HPI.     Objective:   Physical Exam Filed Vitals:   05/28/12 1548  BP: 106/60  Pulse: 67  Temp: 98.5 F (36.9 C)  Resp: 16   Gen'l- WNWD white woman in no distress Cor- RRR Pulm - normal respirations MSK/Derm - tip of index finger left with dermabond over nailbed. NO exudate, not warm       Assessment & Plan:  Crush lacteration left index finger - nail is not loose; wound does not appear infected.  Plan - keep elevated  Keep dressing over wound using finger cot to prevent painful bumping of the finger tip.

## 2012-06-03 ENCOUNTER — Telehealth: Payer: Self-pay | Admitting: Internal Medicine

## 2012-06-03 NOTE — Telephone Encounter (Signed)
Caller: Jamie Levy/Patient; PCP: Illene Regulus (Adults only); Best Callback Phone Number: [phone]. Patient calling following last office visit: 05/28/12: finger checked after ED visit 05/24/12, completed antibiotics, what is wound care now? Guideline: Post-op wound, area dark in color, no change from when MD saw on 05/28/2012, afebrile, keeping a bandage on her finger still, denies drainage.  Guideline: Post-Op Wound, Disposition: Home care with review of instructions per EPIC note. Also Topical cream for face given at office visit 05/19/12, : area looked like ezcema that has cleared that area, should she keep using that cream? Advised per EPIC, that medication was ordered without refills. Care advice offered, call back parameters reviewed.

## 2012-06-13 ENCOUNTER — Telehealth: Payer: Self-pay | Admitting: Internal Medicine

## 2012-06-13 NOTE — Telephone Encounter (Signed)
Caller: [place]/Patient; Patient Name: Jamie Levy; PCP: Illene Regulus (Adults only); Best Callback Phone Number: [phone]. States that she injured one of her fingers/fingernail approximately 2-3 weeks ago. Has been seen in ED for this. Glue placed to fingernail. Is peeling off. Finger is still swollen. No pain. Afebrile. All emergent symptoms per Hand Injury protocol ruled out. Home care advice given. Advised to be seen within 24 hours per guidelines. Appointment scheduled for 06/14/12 at 0930 with Dr. Para March. Called patient back to inform her that this Saturday clinic will be in [place]. States that this has become a hassle and that is too far for her to drive. Requests that I cancel the appointment and she will just "watch it and call back." Requests that someone ask Dr. Debby Bud what she should do from here. States that she does not see any nail growing under injured nail. Nail glue is coming off. Is still swollen. Should she come back in to see him or should she see a specialist. OFFICE PLEASE FOLLOW UP ON MONDAY, 06/16/12.

## 2012-06-14 ENCOUNTER — Ambulatory Visit: Payer: BC Managed Care – PPO | Admitting: Family Medicine

## 2012-06-16 NOTE — Telephone Encounter (Signed)
Best to have a quick look - may be added to today or tomorrow's schedule.

## 2012-06-16 NOTE — Telephone Encounter (Signed)
Left message for pt to call back and schedule

## 2012-07-16 ENCOUNTER — Encounter: Payer: Self-pay | Admitting: Internal Medicine

## 2012-07-16 ENCOUNTER — Ambulatory Visit (INDEPENDENT_AMBULATORY_CARE_PROVIDER_SITE_OTHER): Payer: BC Managed Care – PPO | Admitting: Internal Medicine

## 2012-07-16 VITALS — BP 106/80 | HR 63 | Temp 97.7°F | Resp 16 | Wt 124.0 lb

## 2012-07-16 DIAGNOSIS — S6990XA Unspecified injury of unspecified wrist, hand and finger(s), initial encounter: Secondary | ICD-10-CM

## 2012-07-16 DIAGNOSIS — S6980XA Other specified injuries of unspecified wrist, hand and finger(s), initial encounter: Secondary | ICD-10-CM

## 2012-07-16 NOTE — Patient Instructions (Addendum)
Nail injury - left hand index finger: no evidence of infection despite the mild erythema around the nail bed. The nail is damage but there is new nail visible at the nail bed. This will grow out over time. There may be a groove in the nail depending on whether the nail bed itself was damage. The nail defect will remain until it grows out. At this point you do not need a finger cot or dressing on the nail.  Staph infection facial - looks cleared up. For prevention - normal hygiene. No special cleansing soaps are needed. Remember we swim in an ocean of microbes. Fortunately 99% are harmless.

## 2012-07-16 NOTE — Progress Notes (Signed)
  Subjective:    Patient ID: Jamie Levy, female    DOB: [birth year], 51 y.o.   MRN: [id number]  HPI Jamie Levy returns for follow up after crush injury to index finger left hand with nail damage. She is concerned for continued infection or nail damage. The nail bed is slightly erythematous.  PMH, FamHx and SocHx reviewed for any changes and relevance.  Current Outpatient Prescriptions on File Prior to Visit  Medication Sig Dispense Refill  . Biotin 10 MG TABS Take by mouth.        . cholecalciferol (VITAMIN D) 1000 UNITS tablet Take 1,000 Units by mouth daily.        . clonazePAM (KLONOPIN) 0.5 MG tablet Take 0.5 mg by mouth 2 (two) times daily as needed.        . Cyanocobalamin (VITAMIN B-12 CR) 1000 MCG TBCR Take by mouth. Take 1 tab by mouth daily       . mometasone (NASONEX) 50 MCG/ACT nasal spray One spray each nostril once a day      . pravastatin (PRAVACHOL) 80 MG tablet Take 1 tablet (80 mg total) by mouth daily.  90 tablet  3  . Vilazodone HCl (VIIBRYD) 10 MG TABS Take 5 mg by mouth daily.      . vitamin C (ASCORBIC ACID) 500 MG tablet Take 500 mg by mouth daily. Take 1 tab by mouth daily       . zolpidem (AMBIEN) 10 MG tablet Take 10 mg by mouth at bedtime as needed.        . clotrimazole-betamethasone (LOTRISONE) cream Apply topically 2 (two) times daily.  30 g  0  . fluticasone (FLONASE) 50 MCG/ACT nasal spray Place 1 spray into the nose daily. 1 spray to each nostril daily  16 g  6      Review of Systems System review is negative for any constitutional, cardiac, pulmonary, GI or neuro symptoms or complaints other than as described in the HPI.     Objective:   Physical Exam Filed Vitals:   07/16/12 1551  BP: 106/80  Pulse: 63  Temp: 97.7 F (36.5 C)  Resp: 16   Derm - nail on left index finger with crush damage but no exudate, no nail bed swelling, no heat to touch.       Assessment & Plan:  Nail injury - patient reassured that the nail is not infected and  that it will grow out, possibly with ridging.

## 2012-12-12 ENCOUNTER — Emergency Department (HOSPITAL_COMMUNITY)
Admission: EM | Admit: 2012-12-12 | Discharge: 2012-12-12 | Disposition: A | Discharge status: Home / Self Care | Payer: BC Managed Care – PPO | Attending: Emergency Medicine | Admitting: Emergency Medicine

## 2012-12-12 ENCOUNTER — Encounter (HOSPITAL_COMMUNITY): Payer: Self-pay | Admitting: *Deleted

## 2012-12-12 DIAGNOSIS — R1032 Left lower quadrant pain: Secondary | ICD-10-CM | POA: Insufficient documentation

## 2012-12-12 DIAGNOSIS — Z79899 Other long term (current) drug therapy: Secondary | ICD-10-CM | POA: Insufficient documentation

## 2012-12-12 DIAGNOSIS — H53149 Visual discomfort, unspecified: Secondary | ICD-10-CM | POA: Insufficient documentation

## 2012-12-12 DIAGNOSIS — F3289 Other specified depressive episodes: Secondary | ICD-10-CM | POA: Insufficient documentation

## 2012-12-12 DIAGNOSIS — J329 Chronic sinusitis, unspecified: Secondary | ICD-10-CM | POA: Insufficient documentation

## 2012-12-12 DIAGNOSIS — Z7982 Long term (current) use of aspirin: Secondary | ICD-10-CM | POA: Insufficient documentation

## 2012-12-12 DIAGNOSIS — K5730 Diverticulosis of large intestine without perforation or abscess without bleeding: Secondary | ICD-10-CM | POA: Insufficient documentation

## 2012-12-12 DIAGNOSIS — K589 Irritable bowel syndrome without diarrhea: Secondary | ICD-10-CM | POA: Insufficient documentation

## 2012-12-12 DIAGNOSIS — F329 Major depressive disorder, single episode, unspecified: Secondary | ICD-10-CM | POA: Insufficient documentation

## 2012-12-12 DIAGNOSIS — G4730 Sleep apnea, unspecified: Secondary | ICD-10-CM | POA: Insufficient documentation

## 2012-12-12 DIAGNOSIS — F411 Generalized anxiety disorder: Secondary | ICD-10-CM | POA: Insufficient documentation

## 2012-12-12 DIAGNOSIS — R51 Headache: Secondary | ICD-10-CM

## 2012-12-12 DIAGNOSIS — Z8719 Personal history of other diseases of the digestive system: Secondary | ICD-10-CM | POA: Insufficient documentation

## 2012-12-12 DIAGNOSIS — E785 Hyperlipidemia, unspecified: Secondary | ICD-10-CM | POA: Insufficient documentation

## 2012-12-12 MED ORDER — METOCLOPRAMIDE HCL 5 MG/ML IJ SOLN
10.0000 mg | Freq: Once | INTRAMUSCULAR | Status: AC
  Administered 2012-12-12: 10 mg via INTRAVENOUS
  Filled 2012-12-12: qty 2

## 2012-12-12 MED ORDER — KETOROLAC TROMETHAMINE 30 MG/ML IJ SOLN
30.0000 mg | Freq: Once | INTRAMUSCULAR | Status: AC
  Administered 2012-12-12: 30 mg via INTRAVENOUS
  Filled 2012-12-12: qty 1

## 2012-12-12 MED ORDER — IBUPROFEN 800 MG PO TABS: 800.0000 mg | ORAL_TABLET | Freq: Three times a day (TID) | ORAL | Status: DC | PRN

## 2012-12-12 MED ORDER — SODIUM CHLORIDE 0.9 % IV BOLUS (SEPSIS)
1000.0000 mL | Freq: Once | INTRAVENOUS | Status: AC
  Administered 2012-12-12: 1000 mL via INTRAVENOUS

## 2012-12-12 MED ORDER — DIPHENHYDRAMINE HCL 50 MG/ML IJ SOLN
25.0000 mg | Freq: Once | INTRAMUSCULAR | Status: AC
  Administered 2012-12-12: 25 mg via INTRAVENOUS
  Filled 2012-12-12: qty 1

## 2012-12-12 NOTE — ED Notes (Signed)
Pt reports having a virus last Saturday.  Pt works at an [employer] where children have been sick.  Pt reports having very very slight nausea.  Pt reports feeling weak and tired since being sick but has been progressively feeling better.

## 2012-12-12 NOTE — ED Provider Notes (Signed)
History     CSN: [id number]  Arrival date & time 12/12/12  0213   First MD Initiated Contact with Patient 12/12/12 0230      Chief Complaint  Patient presents with  . Headache    (Consider location/radiation/quality/duration/timing/severity/associated sxs/prior treatment) HPI Jamie Levy is a 52 year old female with past history of anxiety, depression, and chronic fungal sinusitis who presents to the ED for headache. She states the headache started last night, in the frontal area, and was of sudden onset. She reports the pain continued to get worse but she was able to fall asleep. She woke up a couple hours later and states the pain was worse and progressing to her temporal regions. She reports taking 2 ASA 325 mg and a Klonipin. She fell back asleep for a couple hours and then woke up a couple hours later. She states the pain had gotten even worse and progressed to her cervical neck. She called her doctor and was told to come to the ED. She reports the pain is a sharp, stabbing pain in her frontal, parietal, and occipital regions as well as her posterior neck. She reports associated photophobia and phonophobia. She denies fever, chills, visual changes, nausea, vomiting, chest pain, SOB, weakness, numbness, or tingling.  She reports a past history of chronic fungal sinusitis that has required 5 surgeries and she has a 6th scheduled this coming June. She recently had a check up and the doctor gave her prednisone due to inflammation of the sinus and pressure of the orbital wall. She reports she has not taken the medication yet.   Past Medical History  Diagnosis Date  . Unspecified sleep apnea   . Irritable bowel syndrome   . Diarrhea of presumed infectious origin   . Diverticulosis of colon (without mention of hemorrhage)   . Abdominal pain, left lower quadrant   . Other and unspecified hyperlipidemia   . Depressive disorder, not elsewhere classified   . Unspecified sinusitis (chronic)   .  Allergy     Past Surgical History  Procedure Laterality Date  . Abdominal hysterectomy  1997    BSo- Fibroids  . Elbow cystectomy    . Nasal sinus surgery      X 6  . Colon surgery      Family History  Problem Relation Age of Onset  . Ovarian cancer Mother   . Prostate cancer Father   . Colon polyps Father   . Heart disease Father     CAD/CABG/Stents  . Melanoma Father   . Depression Father     suicidal in the past  . Colon cancer Neg Hx     History  Substance Use Topics  . Smoking status: Never Smoker   . Smokeless tobacco: Never Used  . Alcohol Use: Yes     Comment: very rare, avoids alcohol when depressed    OB History   Grav Para Term Preterm Abortions TAB SAB Ect Mult Living                  Review of Systems All other systems negative except as documented in the HPI. All pertinent positives and negatives as reviewed in the HPI.  Allergies  Compazine; Codeine; Iohexol; and Prochlorperazine edisylate  Home Medications   Current Outpatient Rx  Name  Route  Sig  Dispense  Refill  . Biotin 10 MG TABS   Oral   Take by mouth.           Marland Kitchen  cholecalciferol (VITAMIN D) 1000 UNITS tablet   Oral   Take 1,000 Units by mouth daily.           . clonazePAM (KLONOPIN) 0.5 MG tablet   Oral   Take 0.5 mg by mouth 2 (two) times daily as needed.           . clotrimazole-betamethasone (LOTRISONE) cream   Topical   Apply topically 2 (two) times daily.   30 g   0   . Cyanocobalamin (VITAMIN B-12 CR) 1000 MCG TBCR   Oral   Take by mouth. Take 1 tab by mouth daily          . fluticasone (FLONASE) 50 MCG/ACT nasal spray   Nasal   Place 1 spray into the nose daily. 1 spray to each nostril daily   16 g   6   . mometasone (NASONEX) 50 MCG/ACT nasal spray      One spray each nostril once a day         . pravastatin (PRAVACHOL) 80 MG tablet   Oral   Take 1 tablet (80 mg total) by mouth daily.   90 tablet   3   . Vilazodone HCl (VIIBRYD) 10 MG  TABS   Oral   Take 5 mg by mouth daily.         . vitamin C (ASCORBIC ACID) 500 MG tablet   Oral   Take 500 mg by mouth daily. Take 1 tab by mouth daily          . zolpidem (AMBIEN) 10 MG tablet   Oral   Take 10 mg by mouth at bedtime as needed.             BP 126/81  Pulse 80  Temp(Src) 98.4 F (36.9 C)  Resp 18  SpO2 100%  Physical Exam  Constitutional: She is oriented to person, place, and time. She appears well-developed and well-nourished.  HENT:  Head: Normocephalic and atraumatic.  Mouth/Throat: Oropharynx is clear and moist.  Eyes: EOM are normal. Pupils are equal, round, and reactive to light.  Neck: Normal range of motion. Neck supple.  Cardiovascular: Normal rate, regular rhythm, normal heart sounds and intact distal pulses.  Exam reveals no gallop and no friction rub.   No murmur heard. Pulmonary/Chest: Effort normal and breath sounds normal. No respiratory distress.  Musculoskeletal:       Cervical back: She exhibits normal range of motion, no bony tenderness and no edema.  Neurological: She is alert and oriented to person, place, and time. No cranial nerve deficit or sensory deficit. She exhibits normal muscle tone. Coordination normal.  No nuchal rigidity. Negative Brudzinski.  Skin: Skin is warm and dry.    ED Course  Procedures (including critical care time)  Patient is feeling mostly resolved of her headache at this time.  She is advised to return here for any worsening in her condition.  Patient does get headaches, and time to time. advised to followup with her primary care Dr.    MDM          Carlyle Dolly, PA-C 12/12/12 [phone]

## 2012-12-12 NOTE — ED Notes (Signed)
Pt c/o headache; took aspirin and clonazepam at 2300; woke up with unbearable headache; c/o thirst since headache started

## 2012-12-17 NOTE — ED Provider Notes (Signed)
Medical screening examination/treatment/procedure(s) were performed by non-physician practitioner and as supervising physician I was immediately available for consultation/collaboration.   Rena Hunke E Matthewjames Petrasek, MD 12/17/12 2129 

## 2013-01-08 DIAGNOSIS — B358 Other dermatophytoses: Secondary | ICD-10-CM

## 2013-01-08 HISTORY — DX: Other dermatophytoses: B35.8

## 2013-03-23 ENCOUNTER — Ambulatory Visit (INDEPENDENT_AMBULATORY_CARE_PROVIDER_SITE_OTHER): Payer: BC Managed Care – PPO | Admitting: Internal Medicine

## 2013-03-23 ENCOUNTER — Encounter: Payer: Self-pay | Admitting: Internal Medicine

## 2013-03-23 VITALS — BP 118/86 | HR 70 | Temp 98.3°F | Ht 63.5 in | Wt 132.0 lb

## 2013-03-23 DIAGNOSIS — J329 Chronic sinusitis, unspecified: Secondary | ICD-10-CM

## 2013-03-23 NOTE — Patient Instructions (Addendum)
Congratulations on your successful surgery.  The lab results from [facility] were normal. Reviewing your chart back to '04 - no evidence of diabetes.  You will be due for mammogram on August 16th.

## 2013-03-24 ENCOUNTER — Encounter: Payer: Self-pay | Admitting: Internal Medicine
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
## 2013-03-24 NOTE — Assessment & Plan Note (Signed)
S/p major ENT surgery at [facility] June 20th - records reviewed via "Care Everywhere." She is doing much better.

## 2013-07-08 ENCOUNTER — Other Ambulatory Visit: Payer: Self-pay | Admitting: Internal Medicine

## 2013-07-22 ENCOUNTER — Encounter: Payer: Self-pay | Admitting: Internal Medicine

## 2013-09-29 ENCOUNTER — Other Ambulatory Visit: Payer: Self-pay | Admitting: Internal Medicine

## 2013-09-29 NOTE — Telephone Encounter (Signed)
Pt called to follow up on med request. Please advise.  °

## 2013-10-09 ENCOUNTER — Emergency Department (HOSPITAL_COMMUNITY): Payer: BC Managed Care – PPO

## 2013-10-09 ENCOUNTER — Encounter (HOSPITAL_COMMUNITY): Payer: Self-pay | Admitting: Emergency Medicine

## 2013-10-09 ENCOUNTER — Emergency Department (HOSPITAL_COMMUNITY)
Admission: EM | Admit: 2013-10-09 | Discharge: 2013-10-09 | Disposition: A | Discharge status: Home / Self Care | Payer: BC Managed Care – PPO | Attending: Emergency Medicine | Admitting: Emergency Medicine

## 2013-10-09 DIAGNOSIS — G4730 Sleep apnea, unspecified: Secondary | ICD-10-CM | POA: Insufficient documentation

## 2013-10-09 DIAGNOSIS — Z8709 Personal history of other diseases of the respiratory system: Secondary | ICD-10-CM | POA: Insufficient documentation

## 2013-10-09 DIAGNOSIS — IMO0002 Reserved for concepts with insufficient information to code with codable children: Secondary | ICD-10-CM | POA: Insufficient documentation

## 2013-10-09 DIAGNOSIS — Z8619 Personal history of other infectious and parasitic diseases: Secondary | ICD-10-CM | POA: Insufficient documentation

## 2013-10-09 DIAGNOSIS — M5412 Radiculopathy, cervical region: Secondary | ICD-10-CM | POA: Insufficient documentation

## 2013-10-09 DIAGNOSIS — E785 Hyperlipidemia, unspecified: Secondary | ICD-10-CM | POA: Insufficient documentation

## 2013-10-09 DIAGNOSIS — Z7982 Long term (current) use of aspirin: Secondary | ICD-10-CM | POA: Insufficient documentation

## 2013-10-09 DIAGNOSIS — Z8719 Personal history of other diseases of the digestive system: Secondary | ICD-10-CM | POA: Insufficient documentation

## 2013-10-09 DIAGNOSIS — Z79899 Other long term (current) drug therapy: Secondary | ICD-10-CM | POA: Insufficient documentation

## 2013-10-09 DIAGNOSIS — Z8659 Personal history of other mental and behavioral disorders: Secondary | ICD-10-CM | POA: Insufficient documentation

## 2013-10-09 MED ORDER — NAPROXEN 500 MG PO TABS: 500.0000 mg | ORAL_TABLET | Freq: Two times a day (BID) | ORAL | Status: DC

## 2013-10-09 MED ORDER — ONDANSETRON 8 MG PO TBDP: 8.0000 mg | ORAL_TABLET | Freq: Three times a day (TID) | ORAL | Status: DC | PRN

## 2013-10-09 MED ORDER — HYDROCODONE-ACETAMINOPHEN 5-325 MG PO TABS: 1.0000 | ORAL_TABLET | ORAL | Status: DC | PRN

## 2013-10-09 MED ORDER — HYDROMORPHONE HCL PF 1 MG/ML IJ SOLN: 1.0000 mg | INTRAMUSCULAR | Status: DC | PRN

## 2013-10-09 MED ORDER — ONDANSETRON HCL 4 MG/2ML IJ SOLN: 4.0000 mg | Freq: Once | INTRAMUSCULAR | Status: DC | PRN

## 2013-10-09 NOTE — ED Notes (Signed)
Pt denies pain medication at present time states she will contact staff if she would like medication for pain.

## 2013-10-09 NOTE — ED Provider Notes (Signed)
CSN: [id number]     Arrival date & time 10/09/13  0109 History   First MD Initiated Contact with Patient 10/09/13 [phone]     Chief Complaint  Patient presents with  . Shoulder Pain    HPI Comments: The patient noticed the pain yesterday while at work. She did not recall any specific injury or trauma. She has not been doing any lifting recently. Today she was at work when the pain started to become more intense and severe.  She went to her principal to see about leaving to get evaluated. The principal was concerned because she thought she was a little confused. Marland Kitchen apparently the patient told the principal that she taken different medications for her pain. Patient was not slurring her speech. The patient was not otherwise confused. Patient was able to drive herself to the emergency room. The pain is sharp and severe. Certain movements of her right arm and rotation of her torso increase the pain. She denies any trouble with her speech or coordination. She denies any numbness or weakness in her extremities.  Patient is a 53 y.o. female presenting with shoulder pain. The history is provided by the patient.  Shoulder Pain This is a new problem. The current episode started yesterday. The problem occurs constantly. Progression since onset: worsening. Pertinent negatives include no chest pain, no abdominal pain, no headaches and no shortness of breath. Exacerbated by: movement of her right arm and certain positions. Nothing relieves the symptoms. She has tried acetaminophen for the symptoms. The treatment provided no relief.    Past Medical History  Diagnosis Date  . Unspecified sleep apnea   . Irritable bowel syndrome   . Diarrhea of presumed infectious origin   . Diverticulosis of colon (without mention of hemorrhage)   . Abdominal pain, left lower quadrant   . Other and unspecified hyperlipidemia   . Depressive disorder, not elsewhere classified   . Unspecified sinusitis (chronic)   . Allergy   .  Facial ringworm May '14   Past Surgical History  Procedure Laterality Date  . Abdominal hysterectomy  1997    BSo- Fibroids  . Elbow cystectomy    . Nasal sinus surgery      x 7 most recent June '14  . Colon surgery     Family History  Problem Relation Age of Onset  . Ovarian cancer Mother   . Prostate cancer Father   . Colon polyps Father   . Heart disease Father     CAD/CABG/Stents  . Melanoma Father   . Depression Father     suicidal in the past  . Colon cancer Neg Hx    History  Substance Use Topics  . Smoking status: Never Smoker   . Smokeless tobacco: Never Used  . Alcohol Use: Yes     Comment: very rare, avoids alcohol when depressed   OB History   Grav Para Term Preterm Abortions TAB SAB Ect Mult Living                 Review of Systems  Respiratory: Negative for shortness of breath.   Cardiovascular: Negative for chest pain.  Gastrointestinal: Negative for abdominal pain.  Neurological: Negative for headaches.  All other systems reviewed and are negative.    Allergies  Compazine; Codeine; and Iohexol  Home Medications   Current Outpatient Rx  Name  Route  Sig  Dispense  Refill  . aspirin 325 MG tablet   Oral   Take 650 mg by  mouth 2 (two) times daily as needed for pain.         Marland Kitchen BIOTIN PO   Oral   Take 1 tablet by mouth every morning.         . budesonide (PULMICORT) 0.5 MG/2ML nebulizer solution   Nebulization   Take 0.5 mg by nebulization daily.         . clonazePAM (KLONOPIN) 0.5 MG tablet   Oral   Take 0.5 mg by mouth 2 (two) times daily as needed for anxiety.          . pravastatin (PRAVACHOL) 80 MG tablet      take 1 tablet by mouth once daily   90 tablet   3   . valACYclovir (VALTREX) 1000 MG tablet      take 1 tablet by mouth twice a day   180 tablet   1   . zolpidem (AMBIEN) 10 MG tablet   Oral   Take 10-15 mg by mouth at bedtime as needed for sleep.          Marland Kitchen HYDROcodone-acetaminophen (NORCO/VICODIN)  5-325 MG per tablet   Oral   Take 1-2 tablets by mouth every 4 (four) hours as needed.   30 tablet   0   . naproxen (NAPROSYN) 500 MG tablet   Oral   Take 1 tablet (500 mg total) by mouth 2 (two) times daily.   30 tablet   0   . ondansetron (ZOFRAN ODT) 8 MG disintegrating tablet   Oral   Take 1 tablet (8 mg total) by mouth every 8 (eight) hours as needed for nausea or vomiting.   20 tablet   0    BP 155/93  Pulse 71  Temp(Src) 98 F (36.7 C) (Oral)  Resp 20  SpO2 100% Physical Exam  Nursing note and vitals reviewed. Constitutional: She is oriented to person, place, and time. She appears well-developed and well-nourished. No distress.  HENT:  Head: Normocephalic and atraumatic.  Right Ear: External ear normal.  Left Ear: External ear normal.  Eyes: Conjunctivae are normal. Right eye exhibits no discharge. Left eye exhibits no discharge. No scleral icterus.  Neck: Neck supple. No tracheal deviation present.  No carotid bruits  Cardiovascular: Normal rate, regular rhythm and intact distal pulses.   Pulmonary/Chest: Effort normal and breath sounds normal. No stridor. No respiratory distress. She has no wheezes. She has no rales.  Abdominal: Soft. Bowel sounds are normal. She exhibits no distension. There is no tenderness. There is no rebound and no guarding.  Musculoskeletal: She exhibits no edema and no tenderness.       Right shoulder: She exhibits decreased range of motion. She exhibits no tenderness, no bony tenderness, no swelling and no deformity.       Cervical back: She exhibits no tenderness, no bony tenderness and no swelling.  Elevation and rotation of her right shoulder primarily rotating her shoulder posteriorly reproduces her pain ,Equal radial pulses bilaterally  Neurological: She is alert and oriented to person, place, and time. She has normal strength. No cranial nerve deficit (no facial droop, extraocular movements intact, no slurred speech) or sensory  deficit. She exhibits normal muscle tone. She displays no seizure activity. Coordination normal.  5 out of 5 grip strength, sensation light touch intact bilateral extremities  Skin: Skin is warm and dry. No rash noted.  Psychiatric: She has a normal mood and affect.    ED Course  Procedures (including critical care time) Labs Review Labs  Reviewed - No data to display Imaging Review No results found. 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 Cervical radiculopathy    Pt's symptoms correlate with a c5 radiculopathy on the right.  Evidence of ddd on plain films correlate.  Doubt dissection, cardiac etiology.    Will dc home with pain meds.  Discussed follow up and warning signs.  Pain meds offered but pt wants to drive home.     Kathalene Frames, MD 10/09/13 1003

## 2013-10-09 NOTE — ED Notes (Signed)
Per pt/MD pt to be sent home with pain medication. Pt driving home.

## 2013-10-09 NOTE — ED Notes (Signed)
Pt reports pain 10/10 and unable to sit. Pt reports pacing is all she can do at present time. MD Tomi Bamberger notified and pt currently calling to see if she can get a ride home from friend/family to safety discharge post dilaudid administration.

## 2013-10-09 NOTE — Discharge Instructions (Signed)
Cervical Radiculopathy  Cervical radiculopathy happens when a nerve in the neck is pinched or bruised by a slipped (herniated) disk or by arthritic changes in the bones of the cervical spine. This can occur due to an injury or as part of the normal aging process. Pressure on the cervical nerves can cause pain or numbness that runs from your neck all the way down into your arm and fingers.  CAUSES   There are many possible causes, including:  · Injury.  · Muscle tightness in the neck from overuse.  · Swollen, painful joints (arthritis).  · Breakdown or degeneration in the bones and joints of the spine (spondylosis) due to aging.  · Bone spurs that may develop near the cervical nerves.  SYMPTOMS   Symptoms include pain, weakness, or numbness in the affected arm and hand. Pain can be severe or irritating. Symptoms may be worse when extending or turning the neck.  DIAGNOSIS   Your caregiver will ask about your symptoms and do a physical exam. He or she may test your strength and reflexes. X-rays, CT scans, and MRI scans may be needed in cases of injury or if the symptoms do not go away after a period of time. Electromyography (EMG) or nerve conduction testing may be done to study how your nerves and muscles are working.  TREATMENT   Your caregiver may recommend certain exercises to help relieve your symptoms. Cervical radiculopathy can, and often does, get better with time and treatment. If your problems continue, treatment options may include:  · Wearing a soft collar for short periods of time.  · Physical therapy to strengthen the neck muscles.  · Medicines, such as nonsteroidal anti-inflammatory drugs (NSAIDs), oral corticosteroids, or spinal injections.  · Surgery. Different types of surgery may be done depending on the cause of your problems.  HOME CARE INSTRUCTIONS   · Put ice on the affected area.  · Put ice in a plastic bag.  · Place a towel between your skin and the bag.  · Leave the ice on for 15-20 minutes,  03-04 times a day or as directed by your caregiver.  · If ice does not help, you can try using heat. Take a warm shower or bath, or use a hot water bottle as directed by your caregiver.  · You may try a gentle neck and shoulder massage.  · Use a flat pillow when you sleep.  · Only take over-the-counter or prescription medicines for pain, discomfort, or fever as directed by your caregiver.  · If physical therapy was prescribed, follow your caregiver's directions.  · If a soft collar was prescribed, use it as directed.  SEEK IMMEDIATE MEDICAL CARE IF:   · Your pain gets much worse and cannot be controlled with medicines.  · You have weakness or numbness in your hand, arm, face, or leg.  · You have a high fever or a stiff, rigid neck.  · You lose bowel or bladder control (incontinence).  · You have trouble with walking, balance, or speaking.  MAKE SURE YOU:   · Understand these instructions.  · Will watch your condition.  · Will get help right away if you are not doing well or get worse.  Document Released: 05/22/2001 Document Revised: 11/19/2011 Document Reviewed: 04/10/2011  [employer]® Patient Information ©2014 [employer].

## 2013-10-09 NOTE — ED Notes (Addendum)
Pt from home c/o R shoulder pain that started yesterday, has continued to worsen. Pt now has tingling to R arm and radiating pain to R side of back. Pt took 1300 mg Tylenol PTA with no relief. Pt denies injury and states "It started out of the blue." Pt denies dizziness, CP.Pt adds that she has had nausea, no emesis. Pt states that her employer told pt that she "wasn't making sense in her conversation." Pt is A&O and in NAD

## 2014-02-06 ENCOUNTER — Encounter (HOSPITAL_COMMUNITY): Payer: Self-pay | Admitting: Emergency Medicine

## 2014-02-06 ENCOUNTER — Emergency Department (HOSPITAL_COMMUNITY): Payer: BC Managed Care – PPO

## 2014-02-06 ENCOUNTER — Emergency Department (HOSPITAL_COMMUNITY)
Admission: EM | Admit: 2014-02-06 | Discharge: 2014-02-06 | Disposition: A | Discharge status: Home / Self Care | Payer: BC Managed Care – PPO | Attending: Emergency Medicine | Admitting: Emergency Medicine

## 2014-02-06 DIAGNOSIS — G4730 Sleep apnea, unspecified: Secondary | ICD-10-CM | POA: Insufficient documentation

## 2014-02-06 DIAGNOSIS — R110 Nausea: Secondary | ICD-10-CM | POA: Insufficient documentation

## 2014-02-06 DIAGNOSIS — F411 Generalized anxiety disorder: Secondary | ICD-10-CM | POA: Insufficient documentation

## 2014-02-06 DIAGNOSIS — F3289 Other specified depressive episodes: Secondary | ICD-10-CM | POA: Insufficient documentation

## 2014-02-06 DIAGNOSIS — Z79899 Other long term (current) drug therapy: Secondary | ICD-10-CM | POA: Insufficient documentation

## 2014-02-06 DIAGNOSIS — F329 Major depressive disorder, single episode, unspecified: Secondary | ICD-10-CM | POA: Insufficient documentation

## 2014-02-06 DIAGNOSIS — R61 Generalized hyperhidrosis: Secondary | ICD-10-CM | POA: Insufficient documentation

## 2014-02-06 DIAGNOSIS — Z8719 Personal history of other diseases of the digestive system: Secondary | ICD-10-CM | POA: Insufficient documentation

## 2014-02-06 DIAGNOSIS — Z7982 Long term (current) use of aspirin: Secondary | ICD-10-CM | POA: Insufficient documentation

## 2014-02-06 DIAGNOSIS — E785 Hyperlipidemia, unspecified: Secondary | ICD-10-CM | POA: Insufficient documentation

## 2014-02-06 DIAGNOSIS — R079 Chest pain, unspecified: Secondary | ICD-10-CM

## 2014-02-06 DIAGNOSIS — Z8709 Personal history of other diseases of the respiratory system: Secondary | ICD-10-CM | POA: Insufficient documentation

## 2014-02-06 DIAGNOSIS — R0789 Other chest pain: Secondary | ICD-10-CM | POA: Insufficient documentation

## 2014-02-06 DIAGNOSIS — Z8619 Personal history of other infectious and parasitic diseases: Secondary | ICD-10-CM | POA: Insufficient documentation

## 2014-02-06 HISTORY — DX: Sleep apnea, unspecified: G47.30

## 2014-02-06 LAB — CBC
HCT: 41.8 % (ref 36.0–46.0)
Hemoglobin: 14.2 g/dL (ref 12.0–15.0)
MCH: 29.2 pg (ref 26.0–34.0)
MCHC: 34 g/dL (ref 30.0–36.0)
MCV: 86 fL (ref 78.0–100.0)
PLATELETS: 269 10*3/uL (ref 150–400)
RBC: 4.86 MIL/uL (ref 3.87–5.11)
RDW: 12.7 % (ref 11.5–15.5)
WBC: 5 10*3/uL (ref 4.0–10.5)

## 2014-02-06 LAB — COMPREHENSIVE METABOLIC PANEL
ALBUMIN: 4 g/dL (ref 3.5–5.2)
ALK PHOS: 57 U/L (ref 39–117)
ALT: 19 U/L (ref 0–35)
AST: 27 U/L (ref 0–37)
BILIRUBIN TOTAL: 0.3 mg/dL (ref 0.3–1.2)
BUN: 14 mg/dL (ref 6–23)
CHLORIDE: 101 meq/L (ref 96–112)
CO2: 28 mEq/L (ref 19–32)
CREATININE: 0.93 mg/dL (ref 0.50–1.10)
Calcium: 9.3 mg/dL (ref 8.4–10.5)
GFR calc Af Amer: 79 mL/min — ABNORMAL LOW (ref >90)
GFR calc non Af Amer: 68 mL/min — ABNORMAL LOW (ref >90)
Glucose, Bld: 99 mg/dL (ref 70–99)
Potassium: 4.3 mEq/L (ref 3.7–5.3)
Sodium: 140 mEq/L (ref 137–147)
Total Protein: 7.1 g/dL (ref 6.0–8.3)

## 2014-02-06 LAB — LIPASE, BLOOD: Lipase: 49 U/L (ref 11–59)

## 2014-02-06 LAB — I-STAT TROPONIN, ED
Troponin i, poc: 0 ng/mL (ref 0.00–0.08)
Troponin i, poc: 0 ng/mL (ref 0.00–0.08)

## 2014-02-06 MED ORDER — DIPHENHYDRAMINE HCL 50 MG/ML IJ SOLN
50.0000 mg | Freq: Once | INTRAMUSCULAR | Status: AC
  Administered 2014-02-06: 50 mg via INTRAVENOUS
  Filled 2014-02-06: qty 1

## 2014-02-06 MED ORDER — METHYLPREDNISOLONE SODIUM SUCC 40 MG IJ SOLR
40.0000 mg | Freq: Once | INTRAMUSCULAR | Status: DC
  Filled 2014-02-06: qty 1

## 2014-02-06 MED ORDER — IOHEXOL 350 MG/ML SOLN
100.0000 mL | Freq: Once | INTRAVENOUS | Status: AC | PRN
  Administered 2014-02-06: 100 mL via INTRAVENOUS

## 2014-02-06 MED ORDER — SODIUM CHLORIDE 0.9 % IV BOLUS (SEPSIS)
1000.0000 mL | Freq: Once | INTRAVENOUS | Status: AC
  Administered 2014-02-06: 1000 mL via INTRAVENOUS

## 2014-02-06 MED ORDER — ONDANSETRON HCL 4 MG/2ML IJ SOLN
4.0000 mg | Freq: Once | INTRAMUSCULAR | Status: AC
  Administered 2014-02-06: 4 mg via INTRAVENOUS
  Filled 2014-02-06: qty 2

## 2014-02-06 MED ORDER — METHYLPREDNISOLONE SODIUM SUCC 125 MG IJ SOLR
125.0000 mg | Freq: Once | INTRAMUSCULAR | Status: AC
  Administered 2014-02-06: 125 mg via INTRAVENOUS
  Filled 2014-02-06: qty 2

## 2014-02-06 MED ORDER — NITROGLYCERIN 0.4 MG SL SUBL
0.4000 mg | SUBLINGUAL_TABLET | SUBLINGUAL | Status: DC | PRN
  Administered 2014-02-06 (×2): 0.4 mg via SUBLINGUAL
  Filled 2014-02-06: qty 1

## 2014-02-06 MED ORDER — LORAZEPAM 2 MG/ML IJ SOLN
1.0000 mg | Freq: Once | INTRAMUSCULAR | Status: AC
  Administered 2014-02-06: 1 mg via INTRAVENOUS
  Filled 2014-02-06: qty 1

## 2014-02-06 NOTE — Discharge Instructions (Signed)
Please follow up with your primary care physician in 1-2 days. If you do not have one please call the [facility] number listed above. Please follow up with [facility] to schedule a follow up appointment.  Please read all discharge instructions and return precautions.    Chest Pain (Nonspecific) It is often hard to give a specific diagnosis for the cause of chest pain. There is always a chance that your pain could be related to something serious, such as a heart attack or a blood clot in the lungs. You need to follow up with your caregiver for further evaluation. CAUSES   Heartburn.  Pneumonia or bronchitis.  Anxiety or stress.  Inflammation around your heart (pericarditis) or lung (pleuritis or pleurisy).  A blood clot in the lung.  A collapsed lung (pneumothorax). It can develop suddenly on its own (spontaneous pneumothorax) or from injury (trauma) to the chest.  Shingles infection (herpes zoster virus). The chest wall is composed of bones, muscles, and cartilage. Any of these can be the source of the pain.  The bones can be bruised by injury.  The muscles or cartilage can be strained by coughing or overwork.  The cartilage can be affected by inflammation and become sore (costochondritis). DIAGNOSIS  Lab tests or other studies, such as X-rays, electrocardiography, stress testing, or cardiac imaging, may be needed to find the cause of your pain.  TREATMENT   Treatment depends on what may be causing your chest pain. Treatment may include:  Acid blockers for heartburn.  Anti-inflammatory medicine.  Pain medicine for inflammatory conditions.  Antibiotics if an infection is present.  You may be advised to change lifestyle habits. This includes stopping smoking and avoiding alcohol, caffeine, and chocolate.  You may be advised to keep your head raised (elevated) when sleeping. This reduces the chance of acid going backward from your stomach into your  esophagus.  Most of the time, nonspecific chest pain will improve within 2 to 3 days with rest and mild pain medicine. HOME CARE INSTRUCTIONS   If antibiotics were prescribed, take your antibiotics as directed. Finish them even if you start to feel better.  For the next few days, avoid physical activities that bring on chest pain. Continue physical activities as directed.  Do not smoke.  Avoid drinking alcohol.  Only take over-the-counter or prescription medicine for pain, discomfort, or fever as directed by your caregiver.  Follow your caregiver's suggestions for further testing if your chest pain does not go away.  Keep any follow-up appointments you made. If you do not go to an appointment, you could develop lasting (chronic) problems with pain. If there is any problem keeping an appointment, you must call to reschedule. SEEK MEDICAL CARE IF:   You think you are having problems from the medicine you are taking. Read your medicine instructions carefully.  Your chest pain does not go away, even after treatment.  You develop a rash with blisters on your chest. SEEK IMMEDIATE MEDICAL CARE IF:   You have increased chest pain or pain that spreads to your arm, neck, jaw, back, or abdomen.  You develop shortness of breath, an increasing cough, or you are coughing up blood.  You have severe back or abdominal pain, feel nauseous, or vomit.  You develop severe weakness, fainting, or chills.  You have a fever. THIS IS AN EMERGENCY. Do not wait to see if the pain will go away. Get medical help at once. Call your local emergency services (  911 in [place] Do not drive yourself to the hospital. MAKE SURE YOU:   Understand these instructions.  Will watch your condition.  Will get help right away if you are not doing well or get worse. Document Released: 06/06/2005 Document Revised: 11/19/2011 Document Reviewed: 04/01/2008 [facility] Patient Information 2014 [place].  Cardiac  Biomarkers Cardiac biomarkers are enzymes, proteins, and hormones that are associated with heart function, damage or failure. Some of the tests are specific for the heart while others are also elevated with skeletal muscle damage. Cardiac biomarkers are used for diagnostic and prognostic purposes and are frequently ordered by caregivers when someone comes into the Emergency Room complaining of symptoms, such as chest pain, pressure, nausea, and shortness of breath. These tests are ordered, along with other laboratory and non-laboratory tests, to detect heart failure (which is often a chronic, progressive condition affecting the ability of the heart to fill with blood and pump efficiently) and the acute coronary syndromes (ACS) as well as to help determine prognosis for people who have had a heart attack. ACS is a group of symptoms that reflect a sudden decrease in the amount of blood and oxygen, also termed 'ischemia,' reaching the heart. This decrease is frequently due to either a narrowing of the coronary arteries (atherosclerosis or vessel spasm) or unstable plaques, which can cause a blood clot (thrombus) and blockage of blood flow. If the oxygen supply is low, it can cause angina (pain); if blood flow is reduced, it can cause death of heart cells (called myocardial infarction or heart attack) and can lead to death of the affected heart muscle cells and to permanent damage and scarring of the heart.  The goal with cardiac biomarkers is to be able to detect the presence and severity of an acute heart condition as soon as possible so that appropriate treatment can be initiated.  There are only a few cardiac biomarkers that are being routinely used by physicians. Some have been phased out because they are not as specific as the marker of choice  troponin. Many other potential cardiac biomarkers are still being researched but their clinical utility has yet to be established.  Note: Cardiac biomarkers are not the  same tests as those that are used to screen the general healthy population for their risk of developing heart disease. Those can be found under Cardiac Risk Assessment. LABORATORY TESTS CURRENT CARDIAC BIOMARKERS   CK and CK-MB  BNP or (NT-proBNP)  Troponin  Myoglobin (not always used; sometimes ordered with troponin) MORE GENERAL TESTS FREQUENTLY ORDERED ALONG WITH CARDIAC BIOMARKERS   Blood Gases  CMP  BMP  Electrolytes  CBC ON THE HORIZON Ischemia modified albumin (IMA)  Test has received FDA approval for use with troponin and electrocardiogram to rule out acute coronary syndrome (ACS) in patients with chest pain. May become useful for identifying patients at higher risk of heart attack and potentially could replace myoglobin one day.  NON-LABORATORY TESTS These tests allow caregivers to look at the size, shape, and function of the heart as it is beating. They can be used to detect changes to the rhythm of the heart as well as to detect and evaluate damaged tissues and blocked arteries.   EKG (ECG, electrocardiogram)  Coronary angiography (or arteriography)  Stress testing  Nuclear scan  ECG (echocardiogram)  Chest X-ray THE FOLLOWING SUMMARIZES CURRENTLY USED CARDIAC BIOMARKERS. Marker: CK  What: Enzyme that exists in three different isoforms  Where Found: Heart, brain, and skeletal muscle  What Indicates:  Injury to muscle cells  Time to Increase: 4 to 6 hours after injury, peaks in 18 to 24 hours  Time back to Normal: Normal in 48 to 72 hours, unless due to continuing injury  When/How Used: Being phased out, may be ordered prior to CK-MB Marker: CK-MB  What: Heart- related portion of total CK enzyme  Where Found: Heart primarily, but also in skeletal muscle  What Indicates: Injury (cell death) to heart  Time to Increase: 4 to 6 hrs after heart attack, peaks in 12 to 20 hours  Time back to Normal: Returns to normal in 24 to 48 hours unless  new/continual damage  When/How Used: Not as specific as Troponin for heart injury/attack, may be ordered when Troponin is not available, may be ordered to monitor new/continuing damage Marker: Myoglobin  What: Small oxygen-storing protein  Where Found: Heart and other muscle cells  What Indicates: Injury to heart or other muscle cells. Also elevated with kidney problems.  Time to Increase: Starts to rise within 2 to 3 hours, peaks in 8 to 12 hours.  Time back to Normal: Falls back to normal by about one day after injury occurred  When/How Used: Ordered along with Troponin, helps diagnose heart injury/attack Marker: Cardiac Troponin  What: Components of a Regulatory protein complex. Two cardiac specific isoforms: T and I  Where Found: Heart muscle  What Indicates: Heart injury/damage  Time to Increase: 4 to 8 hours  Time back to Normal: Remains elevated for 7 to 14 days  When/How Used: Ordered to help assess prognosis and diagnose heart attack Marker: LDH  What: Enzyme  Where Found: Almost all body tissues  What Indicates: General marker of injury to cells  When/How Used: Phased out, not specific Marker: AST  What: Enzyme  Where Found: Almost all body tissues  What Indicates: General marker of injury to cells  When/How Used: Phased out, not specific Marker: Hs-CRP  What: Protein  Where Found: Associated with athero-sclerosis  What Indicates: Inflammatory process  Time back to Normal: Elevated with inflammation  When/How Used: May help determine prognosis of patients who have had heart attack Marker: BNP  What: Hormone  Where Found: Heart's left ventricle  What Indicates: Heart failure  Time back to Normal: Elevation related to severity  When/How Used: Help diagnose and evaluate heart failure, prognosis, and to monitor therapy Document Released: 09/19/2004 Document Revised: 11/19/2011 Document Reviewed: 06/06/2005 [facility] Patient Information 2014  [place], [place].  Aspirin and Your Heart Aspirin affects the way your blood clots and helps "thin" the blood. Aspirin has many uses in heart disease. It may be used as a primary prevention to help reduce the risk of heart related events. It also can be used as a secondary measure to prevent more heart attacks or to prevent additional damage from blood clots.  ASPIRIN MAY HELP IF YOU:  Have had a heart attack or chest pain.  Have undergone open heart surgery such as CABG (Coronary Artery Bypass Surgery).  Have had coronary angioplasty with or without stents.  Have experienced a stroke or TIA (transient ischemic attack).  Have peripheral vascular disease (PAD).  Have chronic heart rhythm problems such as atrial fibrillation.  Are at risk for heart disease. BEFORE STARTING ASPIRIN Before you start taking aspirin, your caregiver will need to review your medical history. Many things will need to be taken into consideration, such as:  Smoking status.  Blood pressure.  Diabetes.  Gender.  Weight.  Cholesterol level. ASPIRIN DOSES  Aspirin should only be taken on the advice of your caregiver. Talk to your caregiver about how much aspirin you should take. Aspirin comes in different doses such as:  81 mg.  162 mg.  325 mg.  The aspirin dose you take may be affected by many factors, some of which include:  Your current medications, especially if your are taking blood-thinners or anti-platelet medicine.  Liver function.  Heart disease risk.  Age.  Aspirin comes in two forms:  Non-enteric-coated. This type of aspirin does not have a coating and is absorbed faster. Non-enteric coated aspirin is recommended for patients experiencing chest pain symptoms. This type of aspirin also comes in a chewable form.  Enteric-coated. This means the aspirin has a special coating that releases the medicine very slowly. Enteric-coated aspirin causes less stomach upset. This type of aspirin  should not be chewed or crushed. ASPIRIN SIDE EFFECTS Daily use of aspirin can increase your risk of serious side effects. Some of these include:  Increased bleeding. This can range from a cut that does not stop bleeding to more serious problems such as stomach bleeding or bleeding into the brain (Intracerebral bleeding).  Increased bruising.  Stomach upset.  An allergic reaction such as red, itchy skin.  Increased risk of bleeding when combined with non-steroidal anti-inflammatory medicine (NSAIDS).  Alcohol should be drank in moderation when taking aspirin. Alcohol can increase the risk of stomach bleeding when taken with aspirin.  Aspirin should not be given to children less than 67 years of age due to the association of Reye syndrome. Reye syndrome is a serious illness that can affect the brain and liver. Studies have linked Reye syndrome with aspirin use in children.  People that have nasal polyps have an increased risk of developing an aspirin allergy. SEEK MEDICAL CARE IF:   You develop an allergic reaction such as:  Hives.  Itchy skin.  Swelling of the lips, tongue or face.  You develop stomach pain.  You have unusual bleeding or bruising.  You have ringing in your ears. SEEK IMMEDIATE MEDICAL CARE IF:   You have severe chest pain, especially if the pain is crushing or pressure-like and spreads to the arms, back, neck, or jaw. THIS IS AN EMERGENCY. Do not wait to see if the pain will go away. Get medical help at once. Call your local emergency services (911 in the [place] DO NOT drive yourself to the hospital.  You have stroke-like symptoms such as:  Loss of vision.  Difficulty talking.  Numbness or weakness on one side of your body.  Numbness or weakness in your arm or leg.  Not thinking clearly or feeling confused.  Your bowel movements are bloody, dark red or black in color.  You vomit or cough up blood.  You have blood in your urine.  You have  shortness of breath, coughing or wheezing. MAKE SURE YOU:   Understand these instructions.  Will monitor your condition.  Seek immediate medical care if necessary. Document Released: 08/09/2008 Document Revised: 12/22/2012 Document Reviewed: 08/09/2008 [facility] Patient Information 2014 [place].

## 2014-02-06 NOTE — ED Notes (Signed)
Pt reports a pin point pain to L chest intermittent x 1 month. Pt states she does not have a PCP at this time. Pain unrelieved by OTC analgesics .

## 2014-02-06 NOTE — ED Notes (Signed)
Pt returned from xray

## 2014-02-06 NOTE — ED Provider Notes (Signed)
CSN: [id number]     Arrival date & time 02/06/14  0524 History   First MD Initiated Contact with Patient 02/06/14 0606     Chief Complaint  Patient presents with  . Chest Pain     (Consider location/radiation/quality/duration/timing/severity/associated sxs/prior Treatment) HPI Comments: Patient is a 53 yo F PMHx significant for HLD presenting to the ED for chest pain. Patient states for the last month she has had intermittent episodes of left sided chest pressure, but this morning around midnight awoke up with left sided chest pressure with radiation to back with associated diaphoresis and nausea. Denies any associated emesis or SOB. Patient did take 325mg  ASA at 11PM last evening. Denies any history of similar CP to this in the past. Father has cardiac disease, CABG in early 68s.   Patient is a 53 y.o. female presenting with chest pain.  Chest Pain Associated symptoms: diaphoresis and nausea   Associated symptoms: no abdominal pain, no cough, no fever, no shortness of breath and not vomiting     Past Medical History  Diagnosis Date  . Unspecified sleep apnea   . Irritable bowel syndrome   . Diarrhea of presumed infectious origin   . Diverticulosis of colon (without mention of hemorrhage)   . Abdominal pain, left lower quadrant   . Other and unspecified hyperlipidemia   . Depressive disorder, not elsewhere classified   . Unspecified sinusitis (chronic)   . Allergy   . Facial ringworm May '14  . Sleep apnea    Past Surgical History  Procedure Laterality Date  . Abdominal hysterectomy  1997    BSo- Fibroids  . Elbow cystectomy    . Nasal sinus surgery      x 7 most recent June '14  . Colon surgery     Family History  Problem Relation Age of Onset  . Ovarian cancer Mother   . Prostate cancer Father   . Colon polyps Father   . Heart disease Father     CAD/CABG/Stents  . Melanoma Father   . Depression Father     suicidal in the past  . Colon cancer Neg Hx    History   Substance Use Topics  . Smoking status: Never Smoker   . Smokeless tobacco: Never Used  . Alcohol Use: Yes     Comment: very rare, avoids alcohol when depressed   OB History   Grav Para Term Preterm Abortions TAB SAB Ect Mult Living                 Review of Systems  Constitutional: Positive for diaphoresis. Negative for fever.  Respiratory: Negative for cough and shortness of breath.   Cardiovascular: Positive for chest pain.  Gastrointestinal: Positive for nausea. Negative for vomiting, abdominal pain and diarrhea.  All other systems reviewed and are negative.     Allergies  Compazine; Codeine; Iohexol; Morphine and related; and Sulfamethoxazole-trimethoprim  Home Medications   Prior to Admission medications   Medication Sig Start Date End Date Taking? Authorizing Provider  acetaminophen (TYLENOL) 325 MG tablet Take 650 mg by mouth every 6 (six) hours as needed for moderate pain.   Yes Historical Provider, MD  aspirin 325 MG tablet Take 325 mg by mouth once.   Yes Historical Provider, MD  BIOTIN PO Take 1 tablet by mouth every morning.   Yes Historical Provider, MD  budesonide (PULMICORT) 0.5 MG/2ML nebulizer solution Take 0.5 mg by nebulization daily as needed (for breathing complications).    Yes Historical  Provider, MD  clonazePAM (KLONOPIN) 0.5 MG tablet Take 0.5 mg by mouth 2 (two) times daily as needed for anxiety.    Yes Historical Provider, MD  lamoTRIgine (LAMICTAL) 100 MG tablet Take 100 mg by mouth every morning.  01/02/14  Yes Historical Provider, MD  loratadine (CLARITIN) 10 MG tablet Take 10 mg by mouth daily as needed for allergies.    Yes Historical Provider, MD  Multiple Vitamin (MULTIVITAMIN WITH MINERALS) TABS tablet Take 1 tablet by mouth every morning.   Yes Historical Provider, MD  simvastatin (ZOCOR) 80 MG tablet Take 80 mg by mouth at bedtime.   Yes Historical Provider, MD  valACYclovir (VALTREX) 1000 MG tablet Take 1,000 mg by mouth daily as needed  (for cold sore).   Yes Historical Provider, MD  zolpidem (AMBIEN) 10 MG tablet Take 10-15 mg by mouth at bedtime as needed for sleep.    Yes Historical Provider, MD   BP 120/87  Pulse 64  Temp(Src) 98.1 F (36.7 C) (Oral)  Resp 18  SpO2 99% Physical Exam  Nursing note and vitals reviewed. Constitutional: She is oriented to person, place, and time. She appears well-developed and well-nourished. She is cooperative. No distress.  HENT:  Head: Normocephalic and atraumatic.  Right Ear: External ear normal.  Left Ear: External ear normal.  Nose: Nose normal.  Mouth/Throat: Uvula is midline and oropharynx is clear and moist. Mucous membranes are dry. No oropharyngeal exudate.  Eyes: Conjunctivae are normal.  Neck: Normal range of motion. Neck supple.  Cardiovascular: Normal rate, regular rhythm, normal heart sounds and intact distal pulses.   Pulmonary/Chest: Effort normal and breath sounds normal.  Abdominal: Soft. Bowel sounds are normal. She exhibits no distension. There is no tenderness. There is no rebound and no guarding.  Musculoskeletal: Normal range of motion. She exhibits no edema.  Neurological: She is alert and oriented to person, place, and time.  Skin: Skin is warm and dry. She is not diaphoretic.  Psychiatric: Her mood appears anxious.    ED Course  Procedures (including critical care time) Medications  sodium chloride 0.9 % bolus 1,000 mL (0 mLs Intravenous Stopped 02/06/14 0735)  ondansetron (ZOFRAN) injection 4 mg (4 mg Intravenous Given 02/06/14 0635)  diphenhydrAMINE (BENADRYL) injection 50 mg (50 mg Intravenous Given 02/06/14 0722)  LORazepam (ATIVAN) injection 1 mg (1 mg Intravenous Given 02/06/14 0727)  methylPREDNISolone sodium succinate (SOLU-MEDROL) 125 mg/2 mL injection 125 mg (125 mg Intravenous Given 02/06/14 0724)  iohexol (OMNIPAQUE) 350 MG/ML injection 100 mL (100 mLs Intravenous Contrast Given 02/06/14 0827)    Labs Review Labs Reviewed  COMPREHENSIVE  METABOLIC PANEL - Abnormal; Notable for the following:    GFR calc non Af Amer 68 (*)    GFR calc Af Amer 79 (*)    All other components within normal limits  CBC  LIPASE, BLOOD  I-STAT TROPOININ, ED  I-STAT TROPOININ, ED    Imaging Review Dg Chest 2 View  02/06/2014   CLINICAL DATA:  Left-sided chest pain  EXAM: CHEST  2 VIEW  COMPARISON:  None.  FINDINGS: Normal heart size and mediastinal contours. No acute infiltrate or edema. No effusion or pneumothorax. No acute osseous findings.  IMPRESSION: No active cardiopulmonary disease.   Electronically Signed   By: Jorje Guild M.D.   On: 02/06/2014 06:02   Ct Angio Chest Pe W/cm &/or Wo Cm  02/06/2014   CLINICAL DATA:  Intermittent left chain for 1 month, now with pain radiating around to the back with  diaphoresis and nausea. Evaluate for pulmonary embolism.  EXAM: CT ANGIOGRAPHY CHEST WITH CONTRAST  TECHNIQUE: Multidetector CT imaging of the chest was performed using the standard protocol during bolus administration of intravenous contrast. Multiplanar CT image reconstructions and MIPs were obtained to evaluate the vascular anatomy.  CONTRAST:  133mL OMNIPAQUE IOHEXOL 350 MG/ML SOLN - patient has history of contrast allergy and as such received standard preprocedural steroid prep. Contrast was administered without incident.  COMPARISON:  Chest radiograph - 02/06/2014  FINDINGS: Vascular Findings:  There is adequate opacification of the pulmonary arterial system with the main pulmonary artery measuring 258 Hounsfield units. No discrete filling defects are seen within the pulmonary arterial tree. Normal caliber the main pulmonary artery.  Normal heart size.  No pericardial effusion.  Normal caliber of the thoracic aorta. No thoracic aortic dissection or periaortic stranding. Conventional configuration of the aortic arch. The branch vessels of the aortic arch are widely patent throughout their imaged course.  Review of the MIP images confirms the above  findings.   ----------------------------------------------------------------------------------  Nonvascular Findings:  There is minimal grossly symmetric deep tendon subpleural ground-glass atelectasis. There is minimal subsegmental atelectasis within the left lower lobe adjacent to the left minor fissure. No focal airspace opacities. No pleural effusion or pneumothorax. The central pulmonary airways are widely patent. No discrete pulmonary nodules. Scattered shotty mediastinal and right hilar lymph nodes are individually not enlarged by size criteria with index right suprahilar nodal conglomeration measuring approximately 0.7 cm in greatest short axis diameter (image 39, series 4). No mediastinal, hilar or axillary lymphadenopathy.  Early arterial phase evaluation of the upper abdomen is normal.  No acute or aggressive osseous abnormalities. There is mild multilevel DDD throughout the mid and caudal aspects of the thoracic spine. Very mild scoliotic curvature of the thoracic spine, convex to the right, likely positional.  Regional soft tissues appear normal. Normal appearance of the imaged portions of the thyroid gland.  IMPRESSION: No acute cardiopulmonary disease. Specifically, no evidence of pulmonary embolism.   Electronically Signed   By: Sandi Mariscal M.D.   On: 02/06/2014 08:57     EKG Interpretation   Date/Time:  Saturday Feb 06 2014 05:34:00 EDT Ventricular Rate:  71 PR Interval:  142 QRS Duration: 114 QT Interval:  403 QTC Calculation: 438 R Axis:   34 Text Interpretation:  Sinus rhythm Borderline intraventricular conduction  delay Baseline wander in lead(s) II III aVF Confirmed by YELVERTON  MD,  DAVID ([id number]) on 02/06/2014 5:39:37 AM      MDM   Final diagnoses:  Chest pain    Filed Vitals:   02/06/14 0914  BP: 120/87  Pulse: 64  Temp:   Resp: 18    Afebrile, NAD, non-toxic appearing, AAOx4. Patient is to be discharged with recommendation to follow up with PCP in regards to  today's hospital visit. Symptoms seem consistent with esophageal spasm. Chest pain is not likely of cardiac or pulmonary etiology d/t presentation, VSS, no tracheal deviation, no JVD or new murmur, RRR, breath sounds equal bilaterally, EKG without acute abnormalities, negative delta troponin, and negative CXR. Negative CT angio of chest, no evidence of PE, great vessel dissection or other acute abnormality. Pt has been advised to return to the ED is CP becomes exertional, associated with diaphoresis or nausea, radiates to left jaw/arm, worsens or becomes concerning in any way. Pt appears reliable for follow up and is agreeable to discharge.   Case has been discussed with and seen by Dr. Lita Mains who  suggests the above plan and discharge.    Stephani Police Jaiyana Canale, PA-C 02/06/14 1552

## 2014-02-06 NOTE — ED Notes (Signed)
Pt reports being woke up last night with L sided chest pain on at  @ 0000, pt states pain radiating around to back with diaphoresis and nausea. Pt states she did take 325 ASA before bed @ 2200.

## 2014-02-07 NOTE — ED Provider Notes (Signed)
Medical screening examination/treatment/procedure(s) were performed by non-physician practitioner and as supervising physician I was immediately available for consultation/collaboration.   EKG Interpretation   Date/Time:  Saturday Feb 06 2014 05:34:00 EDT Ventricular Rate:  71 PR Interval:  142 QRS Duration: 114 QT Interval:  403 QTC Calculation: 438 R Axis:   34 Text Interpretation:  Sinus rhythm Borderline intraventricular conduction  delay Baseline wander in lead(s) II III aVF Confirmed by Lita Mains  MD,  Antonia Jicha ([id number]) on 02/06/2014 5:39:37 AM        Julianne Rice, MD 02/07/14 [phone]

## 2014-04-02 ENCOUNTER — Encounter: Payer: Self-pay | Admitting: Cardiology

## 2014-05-17 ENCOUNTER — Emergency Department (HOSPITAL_COMMUNITY)
Admission: EM | Admit: 2014-05-17 | Discharge: 2014-05-17 | Disposition: A | Discharge status: Home / Self Care | Payer: BC Managed Care – PPO | Attending: Emergency Medicine | Admitting: Emergency Medicine

## 2014-05-17 ENCOUNTER — Encounter (HOSPITAL_COMMUNITY): Payer: Self-pay | Admitting: Emergency Medicine

## 2014-05-17 ENCOUNTER — Emergency Department (HOSPITAL_COMMUNITY): Payer: BC Managed Care – PPO

## 2014-05-17 DIAGNOSIS — K5900 Constipation, unspecified: Secondary | ICD-10-CM | POA: Diagnosis not present

## 2014-05-17 DIAGNOSIS — Z79899 Other long term (current) drug therapy: Secondary | ICD-10-CM | POA: Insufficient documentation

## 2014-05-17 DIAGNOSIS — F329 Major depressive disorder, single episode, unspecified: Secondary | ICD-10-CM | POA: Diagnosis not present

## 2014-05-17 DIAGNOSIS — R1032 Left lower quadrant pain: Secondary | ICD-10-CM | POA: Insufficient documentation

## 2014-05-17 DIAGNOSIS — M549 Dorsalgia, unspecified: Secondary | ICD-10-CM | POA: Diagnosis present

## 2014-05-17 DIAGNOSIS — F3289 Other specified depressive episodes: Secondary | ICD-10-CM | POA: Insufficient documentation

## 2014-05-17 DIAGNOSIS — IMO0001 Reserved for inherently not codable concepts without codable children: Secondary | ICD-10-CM | POA: Insufficient documentation

## 2014-05-17 DIAGNOSIS — IMO0002 Reserved for concepts with insufficient information to code with codable children: Secondary | ICD-10-CM | POA: Diagnosis not present

## 2014-05-17 DIAGNOSIS — M791 Myalgia, unspecified site: Secondary | ICD-10-CM

## 2014-05-17 DIAGNOSIS — R197 Diarrhea, unspecified: Secondary | ICD-10-CM | POA: Diagnosis not present

## 2014-05-17 LAB — CBC WITH DIFFERENTIAL/PLATELET
Basophils Absolute: 0.1 10*3/uL (ref 0.0–0.1)
Basophils Relative: 1 % (ref 0–1)
Eosinophils Absolute: 0.4 10*3/uL (ref 0.0–0.7)
Eosinophils Relative: 5 % (ref 0–5)
HCT: 42.2 % (ref 36.0–46.0)
Hemoglobin: 14.5 g/dL (ref 12.0–15.0)
Lymphocytes Relative: 18 % (ref 12–46)
Lymphs Abs: 1.3 10*3/uL (ref 0.7–4.0)
MCH: 29.7 pg (ref 26.0–34.0)
MCHC: 34.4 g/dL (ref 30.0–36.0)
MCV: 86.5 fL (ref 78.0–100.0)
Monocytes Absolute: 0.5 10*3/uL (ref 0.1–1.0)
Monocytes Relative: 7 % (ref 3–12)
Neutro Abs: 4.9 10*3/uL (ref 1.7–7.7)
Neutrophils Relative %: 69 % (ref 43–77)
Platelets: 307 10*3/uL (ref 150–400)
RBC: 4.88 MIL/uL (ref 3.87–5.11)
RDW: 12.8 % (ref 11.5–15.5)
WBC: 7 10*3/uL (ref 4.0–10.5)

## 2014-05-17 LAB — URINALYSIS, ROUTINE W REFLEX MICROSCOPIC
Bilirubin Urine: NEGATIVE
Glucose, UA: NEGATIVE mg/dL
Hgb urine dipstick: NEGATIVE
Ketones, ur: NEGATIVE mg/dL
LEUKOCYTES UA: NEGATIVE
NITRITE: NEGATIVE
PH: 6 (ref 5.0–8.0)
Protein, ur: NEGATIVE mg/dL
SPECIFIC GRAVITY, URINE: 1.005 (ref 1.005–1.030)
UROBILINOGEN UA: 0.2 mg/dL (ref 0.0–1.0)

## 2014-05-17 LAB — LIPASE, BLOOD: Lipase: 30 U/L (ref 11–59)

## 2014-05-17 LAB — COMPREHENSIVE METABOLIC PANEL
ALBUMIN: 4.7 g/dL (ref 3.5–5.2)
ALT: 18 U/L (ref 0–35)
AST: 25 U/L (ref 0–37)
Alkaline Phosphatase: 57 U/L (ref 39–117)
Anion gap: 15 (ref 5–15)
BUN: 11 mg/dL (ref 6–23)
CO2: 26 mEq/L (ref 19–32)
CREATININE: 0.92 mg/dL (ref 0.50–1.10)
Calcium: 9.7 mg/dL (ref 8.4–10.5)
Chloride: 98 mEq/L (ref 96–112)
GFR calc Af Amer: 80 mL/min — ABNORMAL LOW (ref >90)
GFR, EST NON AFRICAN AMERICAN: 69 mL/min — AB (ref >90)
Glucose, Bld: 107 mg/dL — ABNORMAL HIGH (ref 70–99)
Potassium: 4 mEq/L (ref 3.7–5.3)
Sodium: 139 mEq/L (ref 137–147)
Total Bilirubin: 0.4 mg/dL (ref 0.3–1.2)
Total Protein: 7.9 g/dL (ref 6.0–8.3)

## 2014-05-17 MED ORDER — METHOCARBAMOL 500 MG PO TABS: 500.0000 mg | ORAL_TABLET | Freq: Two times a day (BID) | ORAL | Status: DC

## 2014-05-17 MED ORDER — NAPROXEN 500 MG PO TABS
500.0000 mg | ORAL_TABLET | Freq: Two times a day (BID) | ORAL | Status: DC
Start: 2014-05-17 — End: 2014-12-20

## 2014-05-17 MED ORDER — LORAZEPAM 1 MG PO TABS
1.0000 mg | ORAL_TABLET | Freq: Once | ORAL | Status: AC
  Administered 2014-05-17: 1 mg via ORAL
  Filled 2014-05-17: qty 1

## 2014-05-17 MED ORDER — IOHEXOL 300 MG/ML  SOLN
50.0000 mL | Freq: Once | INTRAMUSCULAR | Status: AC | PRN
  Administered 2014-05-17: 50 mL via ORAL

## 2014-05-17 MED ORDER — ONDANSETRON HCL 4 MG/2ML IJ SOLN: 4.0000 mg | Freq: Once | INTRAMUSCULAR | Status: DC

## 2014-05-17 NOTE — ED Notes (Signed)
Attempted to start an IV. Patient refused. Patient stated that she wanted to see the physician first and maybe figure out what is wrong before an Iv is started. Patient staes she is worried about costs.

## 2014-05-17 NOTE — ED Provider Notes (Signed)
CSN: [id number]     Arrival date & time 05/17/14  3716 History   First MD Initiated Contact with Patient 05/17/14 [phone]     Chief Complaint  Patient presents with  . back pain, abdominal pain       HPI  Patient presents with left lower quadrant abdominal pain, and left lower back pain. Most her pain is in her back. It is a dull ache. Occasional "sharp and stabbing" pains. Symptoms with movement. Present for 4-5 days. Skin urticarial on Friday. Had some URI symptoms. Had normal urine. Placed on Levaquin. Patient states she was placed on Levaquin "just in case of diverticulitis". Has no diverticuli and one prior episode of diverticulitis treated with oral antibiotics without hospitalization 4 years ago. Has not had a bowel movement for several days. Had a loose bowel movement this morning. No blood. No fever or chills. Mild nausea.  History of C. difficile. 5 years ago. Treated outpatient as well.  Past Medical History  Diagnosis Date  . Unspecified sleep apnea   . Irritable bowel syndrome   . Diarrhea of presumed infectious origin   . Diverticulosis of colon (without mention of hemorrhage)   . Abdominal pain, left lower quadrant   . Other and unspecified hyperlipidemia   . Depressive disorder, not elsewhere classified   . Unspecified sinusitis (chronic)   . Allergy   . Facial ringworm May '14  . Sleep apnea    Past Surgical History  Procedure Laterality Date  . Abdominal hysterectomy  1997    BSo- Fibroids  . Elbow cystectomy    . Nasal sinus surgery      x 7 most recent June '14  . Colonoscopy     Family History  Problem Relation Age of Onset  . Ovarian cancer Mother   . Prostate cancer Father   . Colon polyps Father   . Heart disease Father     CAD/CABG/Stents  . Melanoma Father   . Depression Father     suicidal in the past  . Colon cancer Neg Hx    History  Substance Use Topics  . Smoking status: Never Smoker   . Smokeless tobacco: Never Used  . Alcohol Use: Yes      Comment: very rare, avoids alcohol when depressed   OB History   Grav Para Term Preterm Abortions TAB SAB Ect Mult Living                 Review of Systems  Constitutional: Negative for fever, chills, diaphoresis, appetite change and fatigue.  HENT: Negative for mouth sores, sore throat and trouble swallowing.   Eyes: Negative for visual disturbance.  Respiratory: Negative for cough, chest tightness, shortness of breath and wheezing.   Cardiovascular: Negative for chest pain.  Gastrointestinal: Positive for abdominal pain, diarrhea and constipation. Negative for nausea, vomiting and abdominal distention.  Endocrine: Negative for polydipsia, polyphagia and polyuria.  Genitourinary: Negative for dysuria, frequency and hematuria.  Musculoskeletal: Positive for arthralgias, back pain and myalgias. Negative for gait problem.  Skin: Negative for color change, pallor and rash.  Neurological: Negative for dizziness, syncope, light-headedness and headaches.  Hematological: Does not bruise/bleed easily.  Psychiatric/Behavioral: Negative for behavioral problems and confusion.      Allergies  Compazine; Codeine; Iohexol; Morphine and related; and Sulfamethoxazole-trimethoprim  Home Medications   Prior to Admission medications   Medication Sig Start Date End Date Taking? Authorizing Provider  acetaminophen (TYLENOL) 325 MG tablet Take 650 mg by mouth every 6 (  six) hours as needed for moderate pain.   Yes Historical Provider, MD  BIOTIN PO Take 1 tablet by mouth every morning.   Yes Historical Provider, MD  budesonide (PULMICORT) 0.5 MG/2ML nebulizer solution Place 0.5 mg into the nose daily as needed (patient uses like nose drops for chronic sinusitis).    Yes Historical Provider, MD  clonazePAM (KLONOPIN) 0.5 MG tablet Take 0.5 mg by mouth 2 (two) times daily as needed for anxiety.    Yes Historical Provider, MD  fluticasone (FLONASE) 50 MCG/ACT nasal spray Place 1 spray into both  nostrils daily.   Yes Historical Provider, MD  lamoTRIgine (LAMICTAL) 100 MG tablet Take 100 mg by mouth every morning.  01/02/14  Yes Historical Provider, MD  levofloxacin (LEVAQUIN) 500 MG tablet Take 500 mg by mouth daily.  05/15/14  Yes Historical Provider, MD  loratadine (CLARITIN) 10 MG tablet Take 10 mg by mouth daily as needed for allergies.    Yes Historical Provider, MD  Multiple Vitamin (MULTIVITAMIN WITH MINERALS) TABS tablet Take 1 tablet by mouth every morning.   Yes Historical Provider, MD  naproxen (NAPROSYN) 500 MG tablet Take 500 mg by mouth 2 (two) times daily with a meal.   Yes Historical Provider, MD  simvastatin (ZOCOR) 80 MG tablet Take 80 mg by mouth at bedtime.   Yes Historical Provider, MD  zolpidem (AMBIEN) 10 MG tablet Take 10-15 mg by mouth at bedtime as needed for sleep.    Yes Historical Provider, MD  methocarbamol (ROBAXIN) 500 MG tablet Take 1 tablet (500 mg total) by mouth 2 (two) times daily. 05/17/14   Tanna Furry, MD  naproxen (NAPROSYN) 500 MG tablet Take 1 tablet (500 mg total) by mouth 2 (two) times daily. 05/17/14   Tanna Furry, MD   BP 124/81  Pulse 77  Temp(Src) 98.1 F (36.7 C) (Oral)  Resp 16  SpO2 99% Physical Exam  Constitutional: She is oriented to person, place, and time. She appears well-developed and well-nourished. No distress.  HENT:  Head: Normocephalic.  Eyes: Conjunctivae are normal. Pupils are equal, round, and reactive to light. No scleral icterus.  Neck: Normal range of motion. Neck supple. No thyromegaly present.  Cardiovascular: Normal rate and regular rhythm.  Exam reveals no gallop and no friction rub.   No murmur heard. Pulmonary/Chest: Effort normal and breath sounds normal. No respiratory distress. She has no wheezes. She has no rales.  Abdominal: Soft. Bowel sounds are normal. She exhibits no distension. There is tenderness. There is no rebound.    Musculoskeletal: Normal range of motion.       Back:  Neurological: She is alert  and oriented to person, place, and time.  Skin: Skin is warm and dry. No rash noted.  Psychiatric: Her mood appears anxious. Her speech is not rapid and/or pressured. Cognition and memory are not impaired.  Pt anxious.  States that she does not want an IV, or to put on a gown.  States that "I smell dollar signs with all of those things.  I don't want a huge bill".  Pain in Lt lower back not palpable, but reproduced with bending, twisting.  ED Course  Procedures (including critical care time) Labs Review Labs Reviewed  COMPREHENSIVE METABOLIC PANEL - Abnormal; Notable for the following:    Glucose, Bld 107 (*)    GFR calc non Af Amer 69 (*)    GFR calc Af Amer 80 (*)    All other components within normal limits  CBC  WITH DIFFERENTIAL  LIPASE, BLOOD  URINALYSIS, ROUTINE W REFLEX MICROSCOPIC    Imaging Review Ct Abdomen Pelvis Wo Contrast  05/17/2014   CLINICAL DATA:  Left flank pain. Left lower quadrant abdominal pain. Leukocytosis.  EXAM: CT ABDOMEN AND PELVIS WITHOUT CONTRAST  TECHNIQUE: Multidetector CT imaging of the abdomen and pelvis was performed following the standard protocol without IV contrast.  COMPARISON:  Multiple exams, including 06/28/2011 and 03/11/2009  FINDINGS: Faint tree-in-bud interstitial accentuation noted in the posterior basal segment right lower lobe.  Stable 6 mm calcified lesion along the dome of the right hepatic lobe, image 14 of series 2. Lack of change over the last 5 years indicates a benign etiology.  Noncontrast CT appearance of the spleen, pancreas, and adrenal glands normal. No specific gallbladder or biliary abnormality identified. Kidneys and proximal ureters unremarkable. No ureteral or bladder calculus.  No pathologic upper abdominal adenopathy is observed. Mild levoconvex lumbar scoliosis with rotary component.  No pathologic pelvic adenopathy is observed. Uterus absent. Sigmoid diverticulosis without observed active diverticulitis. No dilated bowel.  Orally administered contrast extends through to the transverse colon.  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  Appendix normal.  Small umbilical hernia contains adipose tissue, similar to prior.  IMPRESSION: 1. No diverticulitis. A specific cause for the left patient's left flank pain is not observed. 2. Minimal levoconvex lumbar scoliosis with rotary component. 3. Small umbilical hernia contains adipose tissue.   Electronically Signed   By: Sherryl Barters M.D.   On: 05/17/2014 10:53     EKG Interpretation None      MDM   Final diagnoses:  Muscular pain    Normal CT. He are normal. No leukocytosis. Her pain seems musculoskeletal and reproducible. Plan is anti-inflammatories, muscle relaxants. Primary care followup.    Tanna Furry, MD 05/17/14 1159

## 2014-05-17 NOTE — Discharge Instructions (Signed)

## 2014-05-17 NOTE — ED Notes (Addendum)
Pt reports hx of diverticulosis. Reports left mid back/flank sharp stabbing pain that radiates sometimes to abdomen started on Friday. Denies trauma or injury. Reports she ate a rice cake and then started having pain. Pain 7/10 at present. Nausea present. No vomiting. Denies dysuria. Pt went to to urgent care on Saturday. Pt had elevated WBC, pt reports she had an URI last week. So pt was prescribed levofloxacin, started meds on Saturday. Pt called PCP today and was told to come to ED. Reports she has been having bowel/ stool problems for a couple weeks. Loose stool with odor. Pt has of cdiff.  No hx of kidney stones.

## 2014-05-17 NOTE — ED Notes (Signed)
Patient refused IV to be started. Patient refused IV zofran.

## 2014-05-28 ENCOUNTER — Telehealth: Payer: Self-pay | Admitting: Internal Medicine

## 2014-05-28 NOTE — Telephone Encounter (Signed)
Left a message for patient to call back. 

## 2014-05-31 NOTE — Telephone Encounter (Signed)
Spoke with patient and reviewed recall. Sent a copy of colonoscopy via EPIC to Dr. Daine Gravel.

## 2014-11-26 ENCOUNTER — Ambulatory Visit (INDEPENDENT_AMBULATORY_CARE_PROVIDER_SITE_OTHER): Payer: BC Managed Care – PPO

## 2014-11-26 ENCOUNTER — Ambulatory Visit (INDEPENDENT_AMBULATORY_CARE_PROVIDER_SITE_OTHER): Payer: BC Managed Care – PPO | Admitting: Emergency Medicine

## 2014-11-26 VITALS — BP 116/82 | HR 65 | Temp 98.1°F | Resp 17 | Ht 63.0 in | Wt 120.8 lb

## 2014-11-26 DIAGNOSIS — R109 Unspecified abdominal pain: Secondary | ICD-10-CM

## 2014-11-26 DIAGNOSIS — R140 Abdominal distension (gaseous): Secondary | ICD-10-CM

## 2014-11-26 DIAGNOSIS — K5900 Constipation, unspecified: Secondary | ICD-10-CM

## 2014-11-26 LAB — POCT URINALYSIS DIPSTICK
Bilirubin, UA: NEGATIVE
Blood, UA: NEGATIVE
Glucose, UA: NEGATIVE
Ketones, UA: NEGATIVE
NITRITE UA: NEGATIVE
PH UA: 7.5
Protein, UA: NEGATIVE
SPEC GRAV UA: 1.015
UROBILINOGEN UA: 0.2

## 2014-11-26 LAB — POCT UA - MICROSCOPIC ONLY
Bacteria, U Microscopic: NEGATIVE
Casts, Ur, LPF, POC: NEGATIVE
Crystals, Ur, HPF, POC: NEGATIVE
MUCUS UA: NEGATIVE
RBC, URINE, MICROSCOPIC: NEGATIVE
WBC, UR, HPF, POC: NEGATIVE
Yeast, UA: NEGATIVE

## 2014-11-26 LAB — POCT CBC
Granulocyte percent: 64.5 %G (ref 37–80)
HEMATOCRIT: 42.8 % (ref 37.7–47.9)
Hemoglobin: 13.2 g/dL (ref 12.2–16.2)
Lymph, poc: 2.2 (ref 0.6–3.4)
MCH, POC: 28 pg (ref 27–31.2)
MCHC: 30.9 g/dL — AB (ref 31.8–35.4)
MCV: 90.7 fL (ref 80–97)
MID (cbc): 0.4 (ref 0–0.9)
MPV: 7.8 fL (ref 0–99.8)
POC Granulocyte: 4.6 (ref 2–6.9)
POC LYMPH %: 30.5 % (ref 10–50)
POC MID %: 5 %M (ref 0–12)
Platelet Count, POC: 305 10*3/uL (ref 142–424)
RBC: 4.72 M/uL (ref 4.04–5.48)
RDW, POC: 15.9 %
WBC: 7.2 10*3/uL (ref 4.6–10.2)

## 2014-11-26 LAB — IFOBT (OCCULT BLOOD): IMMUNOLOGICAL FECAL OCCULT BLOOD TEST: NEGATIVE

## 2014-11-26 NOTE — Patient Instructions (Signed)
We will call you in the morning regarding your scan.

## 2014-11-26 NOTE — Progress Notes (Addendum)
Subjective:    Patient ID: Jamie Levy, female    DOB: 1961-01-08, 54 y.o.   MRN: [id number] This chart was scribed for Arlyss Queen, MD by Marti Sleigh, Medical Scribe. This patient was seen in Room 10 and the patient's care was started at 4:31 PM.  Chief Complaint  Patient presents with  . Flank Pain    Rt side x 4 days  . Bloated    x 4 days  . discoloration around both eyes    today    HPI HPI Comments: Jamie Levy is a 54 y.o. female with a family hx of cancer (mother), and gallbladder diease (father), as well as a hx of full hysterectomy who presents to [facility] complaining of abdominal pain with associated bloating and constipation that started four days ago. Pt endorses associated nausea, GERD, and increased gas. Pt also states that the skin around her eyes is abnormally dark.   Pt states her sx improved yesterday but worsened again this afternoon, and left work to come to [facility] due to her pain. Pt states when she puts pressure on her abdomen she feels it in her rectum.  Pt's last coloscopy was 5 years ago.  Pt also states she has lost weight due to depression. Pt states her father died this year, and the only dog she has ever had also died recently.    Review of Systems  Constitutional: Positive for appetite change and unexpected weight change. Negative for fever and chills.  Gastrointestinal: Positive for nausea, abdominal pain, constipation and abdominal distention.  Genitourinary: Negative for dysuria, urgency and frequency.  Skin: Negative for rash and wound.       Objective:   Physical Exam  Constitutional: She is oriented to person, place, and time. She appears well-developed and well-nourished. No distress.  HENT:  Head: Normocephalic and atraumatic.  Eyes: Pupils are equal, round, and reactive to light.  Neck: Neck supple.  Cardiovascular: Normal rate.   Pulmonary/Chest: Effort normal. No respiratory distress.  Abdominal: Soft. She exhibits distension  (on right). She exhibits no mass. There is tenderness.  On rectal exam the rectal vault is empty no stools palpable.  Musculoskeletal: Normal range of motion.  Neurological: She is alert and oriented to person, place, and time. Coordination normal.  Skin: Skin is warm and dry. She is not diaphoretic.  Psychiatric: She has a normal mood and affect. Her behavior is normal.  Nursing note and vitals reviewed.  Results for orders placed or performed in visit on 11/26/14  POCT CBC  Result Value Ref Range   WBC 7.2 4.6 - 10.2 K/uL   Lymph, poc 2.2 0.6 - 3.4   POC LYMPH PERCENT 30.5 10 - 50 %L   MID (cbc) 0.4 0 - 0.9   POC MID % 5.0 0 - 12 %M   POC Granulocyte 4.6 2 - 6.9   Granulocyte percent 64.5 37 - 80 %G   RBC 4.72 4.04 - 5.48 M/uL   Hemoglobin 13.2 12.2 - 16.2 g/dL   HCT, POC 42.8 37.7 - 47.9 %   MCV 90.7 80 - 97 fL   MCH, POC 28.0 27 - 31.2 pg   MCHC 30.9 (A) 31.8 - 35.4 g/dL   RDW, POC 15.9 %   Platelet Count, POC 305 142 - 424 K/uL   MPV 7.8 0 - 99.8 fL  POCT UA - Microscopic Only  Result Value Ref Range   WBC, Ur, HPF, POC neg    RBC, urine,  microscopic neg    Bacteria, U Microscopic neg    Mucus, UA neg    Epithelial cells, urine per micros 0-1    Crystals, Ur, HPF, POC neg    Casts, Ur, LPF, POC neg    Yeast, UA neg   POCT urinalysis dipstick  Result Value Ref Range   Color, UA yellow    Clarity, UA clear    Glucose, UA neg    Bilirubin, UA neg    Ketones, UA neg    Spec Grav, UA 1.015    Blood, UA neg    pH, UA 7.5    Protein, UA neg    Urobilinogen, UA 0.2    Nitrite, UA neg    Leukocytes, UA Trace    [facility] reading (PRIMARY) by  Dr.Dontario Evetts please comment on films. There is a significant bowel gas pattern but no definite evidence of obstruction. There does appear to be air in the rectum.      Assessment & Plan:    On exam the rectal vault is empty. There is a significant amount of air present on abdominal series, unclear whether this is secondary to adhesions  or this could be IBS with associated constipation. Will proceed with abdominal CT. Referral also made to Dr. Olevia Perches  I personally performed the services described in this documentation, which was scribed in my presence. The recorded information has been reviewed and is accurate.

## 2014-11-27 ENCOUNTER — Telehealth: Payer: Self-pay | Admitting: *Deleted

## 2014-11-27 ENCOUNTER — Ambulatory Visit (HOSPITAL_COMMUNITY)
Admission: RE | Admit: 2014-11-27 | Discharge: 2014-11-27 | Disposition: A | Discharge status: Home / Self Care | Payer: BC Managed Care – PPO | Source: Ambulatory Visit | Attending: Emergency Medicine | Admitting: Emergency Medicine

## 2014-11-27 ENCOUNTER — Encounter (HOSPITAL_COMMUNITY): Payer: Self-pay

## 2014-11-27 ENCOUNTER — Encounter: Payer: Self-pay | Admitting: *Deleted

## 2014-11-27 DIAGNOSIS — K5900 Constipation, unspecified: Secondary | ICD-10-CM | POA: Insufficient documentation

## 2014-11-27 DIAGNOSIS — K5730 Diverticulosis of large intestine without perforation or abscess without bleeding: Secondary | ICD-10-CM | POA: Insufficient documentation

## 2014-11-27 DIAGNOSIS — R140 Abdominal distension (gaseous): Secondary | ICD-10-CM | POA: Diagnosis not present

## 2014-11-27 DIAGNOSIS — R109 Unspecified abdominal pain: Secondary | ICD-10-CM

## 2014-11-27 LAB — COMPREHENSIVE METABOLIC PANEL
ALT: 25 U/L (ref 0–35)
AST: 27 U/L (ref 0–37)
Albumin: 4.6 g/dL (ref 3.5–5.2)
Alkaline Phosphatase: 52 U/L (ref 39–117)
BUN: 8 mg/dL (ref 6–23)
CHLORIDE: 100 meq/L (ref 96–112)
CO2: 28 mEq/L (ref 19–32)
Calcium: 9.6 mg/dL (ref 8.4–10.5)
Creat: 0.84 mg/dL (ref 0.50–1.10)
Glucose, Bld: 91 mg/dL (ref 70–99)
Potassium: 3.9 mEq/L (ref 3.5–5.3)
SODIUM: 138 meq/L (ref 135–145)
Total Bilirubin: 0.5 mg/dL (ref 0.2–1.2)
Total Protein: 7.2 g/dL (ref 6.0–8.3)

## 2014-11-27 LAB — AMYLASE: AMYLASE: 46 U/L (ref 0–105)

## 2014-11-27 LAB — LIPASE: Lipase: 28 U/L (ref 0–75)

## 2014-11-27 NOTE — Telephone Encounter (Signed)
Called Dr. Everlene Farrier first to discuss pt CT results. Dr. Everlene Farrier gave the ok for me to call pt and give results.  Pt was still at [facility].    Results were given to pt and advised an referral to Dr. Olevia Perches was made

## 2014-11-27 NOTE — Telephone Encounter (Signed)
Pt was called and advised to report to [facility] for an scheduled CT.  She was given instructions on where to go to check in.  Pt understood.

## 2014-11-27 NOTE — Addendum Note (Signed)
Addended by: Burnis Kingfisher on: 11/27/2014 10:52 AM   Modules accepted: Orders

## 2014-11-29 ENCOUNTER — Telehealth: Payer: Self-pay

## 2014-11-29 NOTE — Telephone Encounter (Signed)
Patient was sent to [facility] for a CT Scan ABD Pelvis WO contrast on 11/27/14. Per Stanton Kidney at [facility] information provider does not meet medical necessity and is requesting provider to contact them for review. [employer] provider review line [phone] and use patients ID # [id number] as the reference number.

## 2014-11-30 NOTE — Telephone Encounter (Signed)
Spoke to radiology. They did give me a confirmation number of [id number]

## 2014-11-30 NOTE — Telephone Encounter (Signed)
Dr. Everlene Farrier, they may want a physician to physician so this can be covered for pt.

## 2014-12-13 ENCOUNTER — Ambulatory Visit: Payer: BC Managed Care – PPO | Admitting: Physician Assistant

## 2014-12-20 ENCOUNTER — Ambulatory Visit (INDEPENDENT_AMBULATORY_CARE_PROVIDER_SITE_OTHER): Payer: BC Managed Care – PPO | Admitting: Family Medicine

## 2014-12-20 VITALS — BP 110/72 | HR 67 | Temp 98.0°F | Resp 20 | Ht 62.5 in | Wt 125.1 lb

## 2014-12-20 DIAGNOSIS — S0990XA Unspecified injury of head, initial encounter: Secondary | ICD-10-CM | POA: Diagnosis not present

## 2014-12-20 DIAGNOSIS — S0003XA Contusion of scalp, initial encounter: Secondary | ICD-10-CM

## 2014-12-20 DIAGNOSIS — Z79899 Other long term (current) drug therapy: Secondary | ICD-10-CM

## 2014-12-20 DIAGNOSIS — G4700 Insomnia, unspecified: Secondary | ICD-10-CM

## 2014-12-20 NOTE — Patient Instructions (Signed)
I would recommend trying on a different type of medication such as the clonazepam or temazepam. Lots of people also do well on trazodone, doxepin, or elavil - some of these medicnes can be used in additional to above or ambien to give you different choices for different evenings depending upon the amount of sleep you need to get and how tired you are. If you want to stay on ambien that is fine but a lot of women seem to better on lunesta (a sister type medicine) but leaving your dose alone is fine.  I will look forward to getting to know you.  Head Injury You have received a head injury. It does not appear serious at this time. Headaches and vomiting are common following head injury. It should be easy to awaken from sleeping. Sometimes it is necessary for you to stay in the emergency department for a while for observation. Sometimes admission to the hospital may be needed. After injuries such as yours, most problems occur within the first 24 hours, but side effects may occur up to 7-10 days after the injury. It is important for you to carefully monitor your condition and contact your health care provider or seek immediate medical care if there is a change in your condition. WHAT ARE THE TYPES OF HEAD INJURIES? Head injuries can be as minor as a bump. Some head injuries can be more severe. More severe head injuries include:  A jarring injury to the brain (concussion).  A bruise of the brain (contusion). This mean there is bleeding in the brain that can cause swelling.  A cracked skull (skull fracture).  Bleeding in the brain that collects, clots, and forms a bump (hematoma). WHAT CAUSES A HEAD INJURY? A serious head injury is most likely to happen to someone who is in a car wreck and is not wearing a seat belt. Other causes of major head injuries include bicycle or motorcycle accidents, sports injuries, and falls. HOW ARE HEAD INJURIES DIAGNOSED? A complete history of the event leading to the  injury and your current symptoms will be helpful in diagnosing head injuries. Many times, pictures of the brain, such as CT or MRI are needed to see the extent of the injury. Often, an overnight hospital stay is necessary for observation.  WHEN SHOULD I SEEK IMMEDIATE MEDICAL CARE?  You should get help right away if:  You have confusion or drowsiness.  You feel sick to your stomach (nauseous) or have continued, forceful vomiting.  You have dizziness or unsteadiness that is getting worse.  You have severe, continued headaches not relieved by medicine. Only take over-the-counter or prescription medicines for pain, fever, or discomfort as directed by your health care provider.  You do not have normal function of the arms or legs or are unable to walk.  You notice changes in the black spots in the center of the colored part of your eye (pupil).  You have a clear or bloody fluid coming from your nose or ears.  You have a loss of vision. During the next 24 hours after the injury, you must stay with someone who can watch you for the warning signs. This person should contact local emergency services (911 in the [place] if you have seizures, you become unconscious, or you are unable to wake up. HOW CAN I PREVENT A HEAD INJURY IN THE FUTURE? The most important factor for preventing major head injuries is avoiding motor vehicle accidents. To minimize the potential for damage to your head,  it is crucial to wear seat belts while riding in motor vehicles. Wearing helmets while bike riding and playing collision sports (like football) is also helpful. Also, avoiding dangerous activities around the house will further help reduce your risk of head injury.  WHEN CAN I RETURN TO NORMAL ACTIVITIES AND ATHLETICS? You should be reevaluated by your health care provider before returning to these activities. If you have any of the following symptoms, you should not return to activities or contact sports until 1 week  after the symptoms have stopped:  Persistent headache.  Dizziness or vertigo.  Poor attention and concentration.  Confusion.  Memory problems.  Nausea or vomiting.  Fatigue or tire easily.  Irritability.  Intolerant of bright lights or loud noises.  Anxiety or depression.  Disturbed sleep. MAKE SURE YOU:   Understand these instructions.  Will watch your condition.  Will get help right away if you are not doing well or get worse. Document Released: 08/27/2005 Document Revised: 09/01/2013 Document Reviewed: 05/04/2013 [facility] Patient Information 2015 [place], [place]. This information is not intended to replace advice given to you by your health care provider. Make sure you discuss any questions you have with your health care provider. Insomnia Insomnia is frequent trouble falling and/or staying asleep. Insomnia can be a long term problem or a short term problem. Both are common. Insomnia can be a short term problem when the wakefulness is related to a certain stress or worry. Long term insomnia is often related to ongoing stress during waking hours and/or poor sleeping habits. Overtime, sleep deprivation itself can make the problem worse. Every little thing feels more severe because you are overtired and your ability to cope is decreased. CAUSES   Stress, anxiety, and depression.  Poor sleeping habits.  Distractions such as TV in the bedroom.  Naps close to bedtime.  Engaging in emotionally charged conversations before bed.  Technical reading before sleep.  Alcohol and other sedatives. They may make the problem worse. They can hurt normal sleep patterns and normal dream activity.  Stimulants such as caffeine for several hours prior to bedtime.  Pain syndromes and shortness of breath can cause insomnia.  Exercise late at night.  Changing time zones may cause sleeping problems (jet lag). It is sometimes helpful to have someone observe your sleeping patterns. They  should look for periods of not breathing during the night (sleep apnea). They should also look to see how long those periods last. If you live alone or observers are uncertain, you can also be observed at a sleep clinic where your sleep patterns will be professionally monitored. Sleep apnea requires a checkup and treatment. Give your caregivers your medical history. Give your caregivers observations your family has made about your sleep.  SYMPTOMS   Not feeling rested in the morning.  Anxiety and restlessness at bedtime.  Difficulty falling and staying asleep. TREATMENT   Your caregiver may prescribe treatment for an underlying medical disorders. Your caregiver can give advice or help if you are using alcohol or other drugs for self-medication. Treatment of underlying problems will usually eliminate insomnia problems.  Medications can be prescribed for short time use. They are generally not recommended for lengthy use.  Over-the-counter sleep medicines are not recommended for lengthy use. They can be habit forming.  You can promote easier sleeping by making lifestyle changes such as:  Using relaxation techniques that help with breathing and reduce muscle tension.  Exercising earlier in the day.  Changing your diet and the time  of your last meal. No night time snacks.  Establish a regular time to go to bed.  Counseling can help with stressful problems and worry.  Soothing music and white noise may be helpful if there are background noises you cannot remove.  Stop tedious detailed work at least one hour before bedtime. HOME CARE INSTRUCTIONS   Keep a diary. Inform your caregiver about your progress. This includes any medication side effects. See your caregiver regularly. Take note of:  Times when you are asleep.  Times when you are awake during the night.  The quality of your sleep.  How you feel the next day. This information will help your caregiver care for you.  Get out  of bed if you are still awake after 15 minutes. Read or do some quiet activity. Keep the lights down. Wait until you feel sleepy and go back to bed.  Keep regular sleeping and waking hours. Avoid naps.  Exercise regularly.  Avoid distractions at bedtime. Distractions include watching television or engaging in any intense or detailed activity like attempting to balance the household checkbook.  Develop a bedtime ritual. Keep a familiar routine of bathing, brushing your teeth, climbing into bed at the same time each night, listening to soothing music. Routines increase the success of falling to sleep faster.  Use relaxation techniques. This can be using breathing and muscle tension release routines. It can also include visualizing peaceful scenes. You can also help control troubling or intruding thoughts by keeping your mind occupied with boring or repetitive thoughts like the old concept of counting sheep. You can make it more creative like imagining planting one beautiful flower after another in your backyard garden.  During your day, work to eliminate stress. When this is not possible use some of the previous suggestions to help reduce the anxiety that accompanies stressful situations. MAKE SURE YOU:   Understand these instructions.  Will watch your condition.  Will get help right away if you are not doing well or get worse. Document Released: 08/24/2000 Document Revised: 11/19/2011 Document Reviewed: 09/24/2007 [facility] Patient Information 2015 [place], [place]. This information is not intended to replace advice given to you by your health care provider. Make sure you discuss any questions you have with your health care provider.

## 2014-12-20 NOTE — Progress Notes (Signed)
Subjective:    Patient ID: Jamie Levy, female    DOB: 14-Oct-1960, 54 y.o.   MRN: [id number]  HPI Chief Complaint  Patient presents with  . Head Injury    Hit head on Towel rack this morning-Knot on Head   This chart was scribed for Delman Cheadle, MD by Thea Alken, ED Scribe. This patient was seen in room 1and the patient's care was started at 6:01 PM.  HPI Comments: Jamie Levy is a 54 y.o. female who presents to the [facility] complaining of a head injury that occurred 12.5 hours ago. Pt reports while coming up after being bent forward, she hit her head on a towel rack. Pt now presents with painful, bluish knot to right forehead. Pt denies HA, nosebleed, confusion, feeling off balance, visual changes.   Pt has been taking ambien for 7 years due to long standing problems with insomnia. Pt is no longer feeling comfortable with ambien and reports she is concerned of long term effects with ambien specifically dementia. Pt has tried melatonin and states nothing else seems to work. She is currently taking klonopin.   Pt reports hx of allergies and sinusitis. She is currently being seen at [facility].  Pt works as a Publishing rights manager.   Past Medical History  Diagnosis Date  . Unspecified sleep apnea   . Irritable bowel syndrome   . Diarrhea of presumed infectious origin   . Diverticulosis of colon (without mention of hemorrhage)   . Abdominal pain, left lower quadrant   . Other and unspecified hyperlipidemia   . Depressive disorder, not elsewhere classified   . Unspecified sinusitis (chronic)   . Allergy   . Facial ringworm May '14  . Sleep apnea   . Anxiety    Past Surgical History  Procedure Laterality Date  . Abdominal hysterectomy  1997    BSo- Fibroids  . Elbow cystectomy    . Nasal sinus surgery      x 7 most recent June '14  . Colonoscopy     Prior to Admission medications   Medication Sig Start Date End Date Taking? Authorizing Provider    BIOTIN PO Take 1 tablet by mouth every morning.   Yes Historical Provider, MD  budesonide (PULMICORT) 0.5 MG/2ML nebulizer solution Place 0.5 mg into the nose daily as needed (patient uses like nose drops for chronic sinusitis).    Yes Historical Provider, MD  clonazePAM (KLONOPIN) 0.5 MG tablet Take 0.5 mg by mouth 2 (two) times daily as needed for anxiety.    Yes Historical Provider, MD  fluticasone (FLONASE) 50 MCG/ACT nasal spray Place 1 spray into both nostrils daily.   Yes Historical Provider, MD  loratadine (CLARITIN) 10 MG tablet Take 10 mg by mouth daily as needed for allergies.    Yes Historical Provider, MD  Multiple Vitamin (MULTIVITAMIN WITH MINERALS) TABS tablet Take 1 tablet by mouth every morning.   Yes Historical Provider, MD  simvastatin (ZOCOR) 80 MG tablet Take 80 mg by mouth at bedtime.   Yes Historical Provider, MD  zolpidem (AMBIEN) 10 MG tablet Take 10-15 mg by mouth at bedtime as needed for sleep.    Yes Historical Provider, MD   Review of Systems  Constitutional: Negative for fever, chills, activity change, appetite change and unexpected weight change.  HENT: Positive for facial swelling. Negative for congestion, ear discharge, ear pain, hearing loss, nosebleeds, sinus pressure, sore throat, tinnitus, trouble swallowing and voice change.   Eyes:  Negative for visual disturbance.  Gastrointestinal: Negative for nausea and vomiting.  Musculoskeletal: Positive for arthralgias. Negative for gait problem.  Skin: Positive for color change and wound. Negative for rash.  Neurological: Negative for dizziness, tremors, seizures, syncope, speech difficulty, light-headedness and headaches.  Hematological: Bruises/bleeds easily.  Psychiatric/Behavioral: Positive for sleep disturbance and decreased concentration. Negative for confusion and dysphoric mood. The patient is not nervous/anxious.        Forgetful      Objective:   Physical Exam  Constitutional: She is oriented to  person, place, and time. She appears well-developed and well-nourished. No distress.  HENT:  Head: Normocephalic. Not macrocephalic and not microcephalic. Head is with contusion. Head is without raccoon's eyes, without Battle's sign, without abrasion, without laceration, without right periorbital erythema and without left periorbital erythema. Hair is normal.    Right Ear: Tympanic membrane, external ear and ear canal normal.  Left Ear: Tympanic membrane, external ear and ear canal normal.  Nose: Nose normal. No mucosal edema or rhinorrhea.  Mouth/Throat: Uvula is midline, oropharynx is clear and moist and mucous membranes are normal. No oropharyngeal exudate.  Purple raised tender fluctuant 2cm diameter with 3 mm red area in center on right forehead  Eyes: Conjunctivae and EOM are normal. Right eye exhibits no discharge. Left eye exhibits no discharge. No scleral icterus.  Neck: Normal range of motion. Neck supple.  Cardiovascular: Normal rate, regular rhythm, normal heart sounds and intact distal pulses.   Pulmonary/Chest: Effort normal and breath sounds normal.  Musculoskeletal: Normal range of motion.  Lymphadenopathy:    She has no cervical adenopathy.  Neurological: She is alert and oriented to person, place, and time. She has normal strength. She displays no tremor. No cranial nerve deficit or sensory deficit. She displays no seizure activity. Coordination and gait normal.  Skin: Skin is warm and dry. She is not diaphoretic. No erythema.  Psychiatric: She has a normal mood and affect. Her behavior is normal.  Nursing note and vitals reviewed.  Filed Vitals:   12/20/14 1753  BP: 110/72  Pulse: 67  Temp: 98 F (36.7 C)  TempSrc: Oral  Resp: 20  Height: 5' 2.5" (1.588 m)  Weight: 125 lb 2 oz (56.756 kg)  SpO2: 97%    Assessment & Plan:   Head Injury: Pt has been advised to do cold compresses to area for the next 2 days until stable followed by warm compresses. No concerns for  concussion or more extensive injury at this time.  Insomnia: pt has been given a list of alternative medication for her insomnia. Pt has been using ambien 10-15mg  qhs with good result but is concerned about the links she is hearing with dementia and early death so would be willing to try something else if it was safer - failed melatonin but does well on prn clonazepam for anxiety so may want to try higher dose of that qhs or temazepam qhs.  Could try trazodone or other TCA (elavil vs doxepin).  Pt will consider and discuss at f/u.  Pt is having her records transferred here and will sched an appt to est care - she has been at [facility] but is dissatisfied that it takes so long to get an appt.  I personally performed the services described in this documentation, which was scribed in my presence. The recorded information has been reviewed and considered, and addended by me as needed.  Delman Cheadle, MD MPH

## 2015-02-15 ENCOUNTER — Ambulatory Visit: Payer: BC Managed Care – PPO | Admitting: Internal Medicine

## 2015-04-18 ENCOUNTER — Encounter: Payer: Self-pay | Admitting: Family Medicine

## 2015-04-18 ENCOUNTER — Ambulatory Visit (INDEPENDENT_AMBULATORY_CARE_PROVIDER_SITE_OTHER): Payer: BC Managed Care – PPO | Admitting: Family Medicine

## 2015-04-18 VITALS — BP 104/70 | HR 65 | Temp 98.2°F | Resp 16 | Ht 62.75 in | Wt 129.8 lb

## 2015-04-18 DIAGNOSIS — Z7189 Other specified counseling: Secondary | ICD-10-CM | POA: Diagnosis not present

## 2015-04-18 DIAGNOSIS — Z7689 Persons encountering health services in other specified circumstances: Secondary | ICD-10-CM

## 2015-04-18 DIAGNOSIS — G4700 Insomnia, unspecified: Secondary | ICD-10-CM

## 2015-04-18 MED ORDER — TRAZODONE HCL 50 MG PO TABS: 25.0000 mg | ORAL_TABLET | Freq: Every evening | ORAL | Status: DC | PRN

## 2015-04-18 NOTE — Progress Notes (Signed)
[facility] [address] [place] [address] [phone]  Date:  04/18/2015   Name:  Jamie Levy   DOB:  1961-04-10   MRN:  [id number]  PCP:  No primary care provider on file.    Chief Complaint: estab care   History of Present Illness:  Jamie Levy is a 54 y.o. very pleasant female patient who presents with the following:  Here today to establish care with me as her PCP  History of total hyst for fibroids  She does tend to have depression and anxiety- this  run in her family.  Her father had a suicide attempt when she was a teen- he ended up living to age 49 and died not long ago She does note that her mother died of ovarian cancer at age 39. 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    She is on effexor, and takes klonopin 1-2x a day.  Generally takes klonopin bedtime and sometimes during the day She does use ambien some nights, but is trying to stop using this.  Insomnia has been a long term issue for her.  The klonopin generally does help her at night, but she would be willing to try trazodone as she would like to get away from habit forming medications alltogether  She sees Dr. Reece Levy for her psychiatric care She does use valtrex as needed for cold sores She has 3 boys- her oldest has asperger's syndrome.  He has recently started driving and they hope he will be able to live on his own at some point in the future. Although she treasures her sons, the eldest's illness is a challenge and major source of stress She is a single mom Patient Active Problem List   Diagnosis Date Noted  . Facial rash 05/20/2012  . Weight loss, abnormal 07/09/2011  . Routine general medical examination at a health care facility 02/18/2011  .  Skin cancer 02/16/2011  . CONSTIPATION 12/02/2009  . Flatulence, eructation, and gas pain 12/02/2009  . ABDOMINAL PAIN RIGHT LOWER QUADRANT 12/02/2009  . SLEEP APNEA 04/27/2009  . IRRITABLE BOWEL SYNDROME 03/28/2009  . RECTAL BLEEDING 03/15/2009  . HYPERLIPIDEMIA 03/11/2009  . DEPRESSION 03/11/2009  . SINUSITIS, CHRONIC 03/11/2009  . Diverticulosis of colon (without mention of hemorrhage) 03/11/2009  . NAUSEA 03/11/2009  . Abdominal pain, left lower quadrant 03/11/2009    Past Medical History  Diagnosis Date  . Unspecified sleep apnea   . Irritable bowel syndrome   . Diarrhea of presumed infectious origin   . Diverticulosis of colon (without mention of hemorrhage)   . Abdominal pain, left lower quadrant   . Other and unspecified hyperlipidemia   . Depressive disorder, not elsewhere classified   . Unspecified sinusitis (chronic)   . Allergy   . Facial ringworm May '14  . Sleep apnea   . Anxiety     Past Surgical History  Procedure Laterality Date  . Abdominal hysterectomy  1997    BSo- Fibroids  . Elbow cystectomy    . Nasal sinus surgery      x 7 most recent June '14  . Colonoscopy      History  Substance Use Topics  . Smoking status:  Never Smoker   . Smokeless tobacco: Never Used  . Alcohol Use: Yes     Comment: very rare, avoids alcohol when depressed    Family History  Problem Relation Age of Onset  . Ovarian cancer Mother   . Prostate cancer Father   . Colon polyps Father   . Heart disease Father     CAD/CABG/Stents  . Melanoma Father   . Depression Father     suicidal in the past  . Colon cancer Neg Hx     Allergies  Allergen Reactions  . Compazine [Prochlorperazine Maleate] Anaphylaxis    Stroke like symptoms   . Codeine Nausea And Vomiting    All pill forms cause nausea , no relief with benadryl unless IV  . Iohexol Hives and Other (See Comments)     Code: HIVES, Desc: pt developed one hive on rt upper arm and itching on upper lip after  receiving 111ml omni 300, needs to be pre medicated, Onset Date: [id number] 05-17-2014 Patient able to tolerate water soluble PO contrast w/o reaction. rsm  . Morphine And Related Nausea And Vomiting     give benadryl IV side effects lighten  . Sulfamethoxazole-Trimethoprim Other (See Comments)    Other reaction(s): Makes her anxious & keeps her up all night    Medication list has been reviewed and updated.  Current Outpatient Prescriptions on File Prior to Visit  Medication Sig Dispense Refill  . BIOTIN PO Take 1 tablet by mouth every morning.    . budesonide (PULMICORT) 0.5 MG/2ML nebulizer solution Place 0.5 mg into the nose daily as needed (patient uses like nose drops for chronic sinusitis).     . clonazePAM (KLONOPIN) 0.5 MG tablet Take 0.5 mg by mouth 2 (two) times daily as needed for anxiety.     Marland Kitchen loratadine (CLARITIN) 10 MG tablet Take 10 mg by mouth daily as needed for allergies.     . Multiple Vitamin (MULTIVITAMIN WITH MINERALS) TABS tablet Take 1 tablet by mouth every morning.    . simvastatin (ZOCOR) 80 MG tablet Take 80 mg by mouth at bedtime.    Marland Kitchen zolpidem (AMBIEN) 10 MG tablet Take 10-15 mg by mouth at bedtime as needed for sleep.     . fluticasone (FLONASE) 50 MCG/ACT nasal spray Place 1 spray into both nostrils daily.     No current facility-administered medications on file prior to visit.    Review of Systems:  As per HPI- otherwise negative.   Physical Examination: Filed Vitals:   04/18/15 1108  BP: 104/70  Pulse: 65  Temp: 98.2 F (36.8 C)  Resp: 16   Filed Vitals:   04/18/15 1108  Height: 5' 2.75" (1.594 m)  Weight: 129 lb 12.8 oz (58.877 kg)   Body mass index is 23.17 kg/(m^2). Ideal Body Weight: Weight in (lb) to have BMI = 25: 139.7  GEN: WDWN, NAD, Non-toxic, A & O x 3, looks well HEENT: Atraumatic, Normocephalic. Neck supple. No masses, No LAD. Ears and Nose: No external deformity. CV: RRR, No M/G/R. No JVD. No thrill. No extra heart  sounds. PULM: CTA B, no wheezes, crackles, rhonchi. No retractions. No resp. distress. No accessory muscle use. ABD: S, NT, ND, +BS. No rebound. No HSM. EXTR: No c/c/e NEURO Normal gait.  PSYCH: Normally interactive. Conversant. Not depressed or anxious appearing.  Calm demeanor.   Assessment and Plan: Establishing care with new doctor, encounter for  Insomnia - Plan: traZODone (DESYREL) 50 MG tablet  Spent some time  becoming familiar with her PHMx today. She would like to try a medication that is non- habit forming for her anxiety. We will try trazodone, continue effexor.  Discussed unlikely possibility of serotonin syndrome.  Cautioned against taking this with other sedating medications. As she has been taking klonopin regularly 1-2x a day recommended that she taper off it gradually.    Signed Lamar Blinks, MD

## 2015-04-18 NOTE — Patient Instructions (Addendum)
Try the trazodone as needed for sleep at night- if this does not help you, you can go back to the klonopin.   However I would recommend that you not take the trazodone and klonopin together, and certainly not with ambien.

## 2015-04-22 ENCOUNTER — Ambulatory Visit: Payer: BC Managed Care – PPO | Admitting: Internal Medicine

## 2015-04-27 ENCOUNTER — Other Ambulatory Visit (INDEPENDENT_AMBULATORY_CARE_PROVIDER_SITE_OTHER): Payer: BC Managed Care – PPO

## 2015-04-27 ENCOUNTER — Telehealth: Payer: Self-pay | Admitting: Gastroenterology

## 2015-04-27 ENCOUNTER — Ambulatory Visit (INDEPENDENT_AMBULATORY_CARE_PROVIDER_SITE_OTHER): Payer: BC Managed Care – PPO | Admitting: Internal Medicine

## 2015-04-27 ENCOUNTER — Encounter: Payer: Self-pay | Admitting: Internal Medicine

## 2015-04-27 VITALS — BP 104/72 | HR 72 | Ht 62.5 in | Wt 130.1 lb

## 2015-04-27 DIAGNOSIS — R109 Unspecified abdominal pain: Secondary | ICD-10-CM

## 2015-04-27 DIAGNOSIS — R140 Abdominal distension (gaseous): Secondary | ICD-10-CM | POA: Diagnosis not present

## 2015-04-27 LAB — CBC WITH DIFFERENTIAL/PLATELET
BASOS PCT: 0.6 % (ref 0.0–3.0)
Basophils Absolute: 0 10*3/uL (ref 0.0–0.1)
Eosinophils Absolute: 1.8 10*3/uL — ABNORMAL HIGH (ref 0.0–0.7)
Eosinophils Relative: 22.5 % — ABNORMAL HIGH (ref 0.0–5.0)
HEMATOCRIT: 40.3 % (ref 36.0–46.0)
HEMOGLOBIN: 13.6 g/dL (ref 12.0–15.0)
LYMPHS PCT: 21.2 % (ref 12.0–46.0)
Lymphs Abs: 1.7 10*3/uL (ref 0.7–4.0)
MCHC: 33.8 g/dL (ref 30.0–36.0)
MCV: 87.4 fl (ref 78.0–100.0)
MONOS PCT: 8.3 % (ref 3.0–12.0)
Monocytes Absolute: 0.7 10*3/uL (ref 0.1–1.0)
NEUTROS ABS: 3.7 10*3/uL (ref 1.4–7.7)
Neutrophils Relative %: 47.4 % (ref 43.0–77.0)
PLATELETS: 274 10*3/uL (ref 150.0–400.0)
RBC: 4.61 Mil/uL (ref 3.87–5.11)
RDW: 13.7 % (ref 11.5–15.5)
WBC: 7.9 10*3/uL (ref 4.0–10.5)

## 2015-04-27 LAB — HEPATIC FUNCTION PANEL
ALT: 28 U/L (ref 0–35)
AST: 32 U/L (ref 0–37)
Albumin: 4.6 g/dL (ref 3.5–5.2)
Alkaline Phosphatase: 52 U/L (ref 39–117)
BILIRUBIN DIRECT: 0.1 mg/dL (ref 0.0–0.3)
Total Bilirubin: 0.5 mg/dL (ref 0.2–1.2)
Total Protein: 7.4 g/dL (ref 6.0–8.3)

## 2015-04-27 LAB — LIPASE: LIPASE: 33 U/L (ref 11.0–59.0)

## 2015-04-27 LAB — AMYLASE: Amylase: 52 U/L (ref 27–131)

## 2015-04-27 MED ORDER — METRONIDAZOLE 250 MG PO TABS: 250.0000 mg | ORAL_TABLET | Freq: Three times a day (TID) | ORAL | Status: DC

## 2015-04-27 MED ORDER — RANITIDINE HCL 150 MG PO TABS: 150.0000 mg | ORAL_TABLET | Freq: Every morning | ORAL | Status: DC

## 2015-04-27 NOTE — Patient Instructions (Addendum)
You have been scheduled for a HIDA scan at [facility] (1st floor) on 05/12/2015. Please arrive 15 minutes prior to your scheduled appointment at  7:25DG. Make certain not to have anything to eat or drink at least 6 hours prior to your test. Should this appointment date or time not work well for you, please call radiology scheduling at [phone].  _____________________________________________________________________ hepatobiliary (HIDA) scan is an imaging procedure used to diagnose problems in the liver, gallbladder and bile ducts. In the HIDA scan, a radioactive chemical or tracer is injected into a vein in your arm. The tracer is handled by the liver like bile. Bile is a fluid produced and excreted by your liver that helps your digestive system break down fats in the foods you eat. Bile is stored in your gallbladder and the gallbladder releases the bile when you eat a meal. A special nuclear medicine scanner (gamma camera) tracks the flow of the tracer from your liver into your gallbladder and small intestine.  During your HIDA scan  You'll be asked to change into a hospital gown before your HIDA scan begins. Your health care team will position you on a table, usually on your back. The radioactive tracer is then injected into a vein in your arm.The tracer travels through your bloodstream to your liver, where it's taken up by the bile-producing cells. The radioactive tracer travels with the bile from your liver into your gallbladder and through your bile ducts to your small intestine.You may feel some pressure while the radioactive tracer is injected into your vein. As you lie on the table, a special gamma camera is positioned over your abdomen taking pictures of the tracer as it moves through your body. The gamma camera takes pictures continually for about an hour. You'll need to keep still during the HIDA scan. This can become uncomfortable, but you may find that you can lessen the discomfort by  taking deep breaths and thinking about other things. Tell your health care team if you're uncomfortable. The radiologist will watch on a computer the progress of the radioactive tracer through your body. The HIDA scan may be stopped when the radioactive tracer is seen in the gallbladder and enters your small intestine. This typically takes about an hour. In some cases extra imaging will be performed if original images aren't satisfactory, if morphine is given to help visualize the gallbladder or if the medication CCK is given to look at the contraction of the gallbladder. This test typically takes 2 hours to complete. ________________________________________________________________________   Go to the basement for labs today Prescriptions have been sent to your pharmacy  Dr Everlene Farrier

## 2015-04-27 NOTE — Telephone Encounter (Signed)
8/17 Patient would like to establish with Dr. Silverio Decamp after Dr. Lance Coon.

## 2015-04-27 NOTE — Progress Notes (Signed)
Jamie Levy 07-14-1961 [id number]  Note: This dictation was prepared with Dragon digital system. Any transcriptional errors that result from this procedure are unintentional.   History of Present Illness: This is a 54 year old white female with rather acute onset of mid abdominal pain and abdominal distention radiating to her back. . In 2011 upper abdominal ultrasound showed questionable biliary sludge and stones . CT scan of the abdomen in March 2016 revealed only diverticulosis and status post hysterectomy. There is a positive family history  fall gallbladder disease in her father. She has a history of all irritable bowel syndrome. Last colonoscopy in July 2010 showed mild diverticulosis. She was treated with Bentyl 20 mg 3 times a day. She had a massive weight loss from 160 to 113 pounds and backup to 130 pounds.    Past Medical History  Diagnosis Date  . Unspecified sleep apnea   . Irritable bowel syndrome   . Diarrhea of presumed infectious origin   . Diverticulosis of colon (without mention of hemorrhage)   . Abdominal pain, left lower quadrant   . Other and unspecified hyperlipidemia   . Depressive disorder, not elsewhere classified   . Unspecified sinusitis (chronic)   . Allergy   . Facial ringworm May '14  . Sleep apnea   . Anxiety     Past Surgical History  Procedure Laterality Date  . Abdominal hysterectomy  1997    BSo- Fibroids  . Elbow cystectomy    . Nasal sinus surgery      x 7 most recent June '14  . Colonoscopy      Allergies  Allergen Reactions  . Compazine [Prochlorperazine Maleate] Anaphylaxis    Stroke like symptoms   . Codeine Nausea And Vomiting    All pill forms cause nausea , no relief with benadryl unless IV  . Iohexol Hives and Other (See Comments)     Code: HIVES, Desc: pt developed one hive on rt upper arm and itching on upper lip after receiving 191ml omni 300, needs to be pre medicated, Onset Date: [id number] 05-17-2014 Patient able to  tolerate water soluble PO contrast w/o reaction. rsm  . Morphine And Related Nausea And Vomiting     give benadryl IV side effects lighten  . Sulfamethoxazole-Trimethoprim Other (See Comments)    Other reaction(s): Makes her anxious & keeps her up all night    Family history and social history have been reviewed.  Review of Systems: Abdominal distention, bloating. Epigastric pain  The remainder of the 10 point ROS is negative except as outlined in the H&P  Physical Exam: General Appearance Well developed, in no distress Eyes  Non icteric  HEENT  Non traumatic, normocephalic  Mouth No lesion, tongue papillated, no cheilosis Neck Supple without adenopathy, thyroid not enlarged, no carotid bruits, no JVD Lungs Clear to auscultation bilaterally COR Normal S1, normal S2, regular rhythm, no murmur, quiet precordium Abdomen hyperactive bowel sounds. Increased tympany. Diffuse tenderness in all quadrants. No rebound. Liver edge at costal margin Rectal not done Extremities  No pedal edema Skin No lesions Neurological Alert and oriented x 3 Psychological Normal mood and affect  Assessment and Plan:   54 year old white female with the sudden onset of abdominal distention and dyspepsia . tTre is a question of a biliary sludge / stones and also amily history of gallbladder disease. Recent CT scan of the abdomen did not show biliary disease. We will obtain HIDA scan with CCK and check her liver function tests, amylase and lipase.  We will also check sprue profile. We'll start ranitidine 150 mg twice a day and Flagyl 250 mg 3 times a day for possible bacterial overgrowth. She will follow up with the [facility]  PAin 3 weeks. Long-term follow-up with Dr.Nandigam     Delfin Edis 04/27/2015

## 2015-04-28 LAB — GLIA (IGA/G) + TTG IGA
Gliadin IgA: 2 Units (ref <20)
Gliadin IgG: 1 Units (ref <20)
Tissue Transglutaminase Ab, IgA: 1 U/mL (ref <4)

## 2015-04-29 ENCOUNTER — Telehealth: Payer: Self-pay | Admitting: Internal Medicine

## 2015-04-29 NOTE — Telephone Encounter (Signed)
Spoke with patient and told her per OV note the Flagyl is for bacterial overgrowth.

## 2015-04-29 NOTE — Telephone Encounter (Signed)
Left a message for patient to call back. 

## 2015-05-02 ENCOUNTER — Ambulatory Visit (HOSPITAL_COMMUNITY)
Admission: RE | Admit: 2015-05-02 | Discharge: 2015-05-02 | Disposition: A | Discharge status: Home / Self Care | Payer: BC Managed Care – PPO | Source: Ambulatory Visit | Attending: Internal Medicine | Admitting: Internal Medicine

## 2015-05-02 DIAGNOSIS — R140 Abdominal distension (gaseous): Secondary | ICD-10-CM

## 2015-05-02 DIAGNOSIS — R109 Unspecified abdominal pain: Secondary | ICD-10-CM | POA: Diagnosis not present

## 2015-05-02 DIAGNOSIS — G8929 Other chronic pain: Secondary | ICD-10-CM | POA: Diagnosis not present

## 2015-05-02 MED ORDER — TECHNETIUM TC 99M MEBROFENIN IV KIT
5.1000 | PACK | Freq: Once | INTRAVENOUS | Status: DC | PRN
  Administered 2015-05-02: 5 via INTRAVENOUS
  Filled 2015-05-02: qty 6

## 2015-05-12 ENCOUNTER — Ambulatory Visit (HOSPITAL_COMMUNITY): Payer: BC Managed Care – PPO

## 2015-05-16 ENCOUNTER — Telehealth: Payer: Self-pay

## 2015-05-16 DIAGNOSIS — E785 Hyperlipidemia, unspecified: Secondary | ICD-10-CM

## 2015-05-16 NOTE — Telephone Encounter (Signed)
PATIENT WOULD LIKE DR. COPLAND TO KNOW THAT SHE IS COMPLETELY OUT OF HER CHOLESTEROL MEDICATION. SHE LEFT [facility] (DR. SUN) AND ESTABLISHED CARE WITH DR. Lorelei Pont IN AUGUST, BUT SHE FORGET TO MENTION THAT SHE NEEDED BLOOD WORK TO HAVE HER CHOLESTEROL CHECKED. SHE HAS NOT TAKEN IT FOR A WEEK. SHE SAID DR. COPLAND HAS ALL OF HER PREVIOUS RECORDS AND HER MEDS. ON FILE. SHE ALSO MAY BE GETTING A SINUS INFECTION. SHE WANTS TO KNOW IF SHE SHOULD JUST "WAIT IT OUT?" BEST PHONE [phone] (CELL)   [place] [address] [place]. [place]

## 2015-05-17 MED ORDER — SIMVASTATIN 80 MG PO TABS: 80.0000 mg | ORAL_TABLET | Freq: Every day | ORAL | Status: DC

## 2015-05-17 NOTE — Telephone Encounter (Signed)
Pt called back and I gave her the message Dr. Lorelei Pont left in the previous message.  Pt understood.  However, pt states her sxs consist of heavy/itchy eyes, nasal drainage, and sore throat.  She states she has been taking Claritin and it is not doing anything for her.  I advised her if her sxs did not improve by the end of the week or if she develops a fever to come to the office to be seen by Dr. Lorelei Pont.  She understood.

## 2015-05-17 NOTE — Telephone Encounter (Signed)
Refilled her zocor- please plan to have fasting labs the next time we see her, maybe 6 months.  If she continues to have sx of a sinus infection over the next week or so please let me know- will ask Abbie to call and find out more

## 2015-08-18 ENCOUNTER — Ambulatory Visit (INDEPENDENT_AMBULATORY_CARE_PROVIDER_SITE_OTHER): Payer: BC Managed Care – PPO | Admitting: Family Medicine

## 2015-08-18 VITALS — BP 106/64 | HR 68 | Temp 98.1°F | Resp 16 | Ht 62.5 in | Wt 137.4 lb

## 2015-08-18 DIAGNOSIS — R1031 Right lower quadrant pain: Secondary | ICD-10-CM | POA: Diagnosis not present

## 2015-08-18 DIAGNOSIS — S39012A Strain of muscle, fascia and tendon of lower back, initial encounter: Secondary | ICD-10-CM | POA: Diagnosis not present

## 2015-08-18 DIAGNOSIS — M545 Low back pain, unspecified: Secondary | ICD-10-CM

## 2015-08-18 DIAGNOSIS — G4700 Insomnia, unspecified: Secondary | ICD-10-CM | POA: Diagnosis not present

## 2015-08-18 LAB — POCT CBC
Granulocyte percent: 65.3 %G (ref 37–80)
HCT, POC: 41.6 % (ref 37.7–47.9)
Hemoglobin: 14.3 g/dL (ref 12.2–16.2)
LYMPH, POC: 1.8 (ref 0.6–3.4)
MCH, POC: 29.7 pg (ref 27–31.2)
MCHC: 34.3 g/dL (ref 31.8–35.4)
MCV: 86.6 fL (ref 80–97)
MID (CBC): 0.5 (ref 0–0.9)
MPV: 8.1 fL (ref 0–99.8)
PLATELET COUNT, POC: 278 10*3/uL (ref 142–424)
POC Granulocyte: 4.2 (ref 2–6.9)
POC LYMPH %: 27.2 % (ref 10–50)
POC MID %: 7.5 %M (ref 0–12)
RBC: 4.81 M/uL (ref 4.04–5.48)
RDW, POC: 13 %
WBC: 6.5 10*3/uL (ref 4.6–10.2)

## 2015-08-18 LAB — POC MICROSCOPIC URINALYSIS (UMFC): MUCUS RE: ABSENT

## 2015-08-18 LAB — POCT URINALYSIS DIP (MANUAL ENTRY)
Bilirubin, UA: NEGATIVE
Blood, UA: NEGATIVE
GLUCOSE UA: NEGATIVE
Ketones, POC UA: NEGATIVE
NITRITE UA: NEGATIVE
PROTEIN UA: NEGATIVE
Spec Grav, UA: 1.01
UROBILINOGEN UA: 0.2
pH, UA: 5.5

## 2015-08-18 MED ORDER — CYCLOBENZAPRINE HCL 5 MG PO TABS: 5.0000 mg | ORAL_TABLET | Freq: Three times a day (TID) | ORAL | Status: DC | PRN

## 2015-08-18 NOTE — Patient Instructions (Addendum)
It appears you have a low back strain. You can take over-the-counter Advil or Aleve as long as this does not worsen your stomach  Symptoms.   Flexeril which is a muscle relaxant can be taken 1-2 every 8 hours. This does cause sedation, so be careful combining with other sedating medicines as we discussed. Heat or ice to the affected area, and gentle range of motion next few days. See information below.   Call your gastroenterologist for  follow-up of your abdominal symptoms. If you are constipated, and about a bowel movement 2 days, can try over-the-counter MiraLAX once. Return here or ER if fever, nausea, vomiting or worse.   Follow-up with Dr. Lorelei Pont to discuss insomnia further.  No change in medications at this time.  Return to the clinic or go to the nearest emergency room if any of your symptoms worsen or new symptoms occur.    Low Back Strain With Rehab A strain is an injury in which a tendon or muscle is torn. The muscles and tendons of the lower back are vulnerable to strains. However, these muscles and tendons are very strong and require a great force to be injured. Strains are classified into three categories. Grade 1 strains cause pain, but the tendon is not lengthened. Grade 2 strains include a lengthened ligament, due to the ligament being stretched or partially ruptured. With grade 2 strains there is still function, although the function may be decreased. Grade 3 strains involve a complete tear of the tendon or muscle, and function is usually impaired. SYMPTOMS   Pain in the lower back.  Pain that affects one side more than the other.  Pain that gets worse with movement and may be felt in the hip, buttocks, or back of the thigh.  Muscle spasms of the muscles in the back.  Swelling along the muscles of the back.  Loss of strength of the back muscles.  Crackling sound (crepitation) when the muscles are touched. CAUSES  Lower back strains occur when a force is placed on the  muscles or tendons that is greater than they can handle. Common causes of injury include:  Prolonged overuse of the muscle-tendon units in the lower back, usually from incorrect posture.  A single violent injury or force applied to the back. RISK INCREASES WITH:  Sports that involve twisting forces on the spine or a lot of bending at the waist (football, rugby, weightlifting, bowling, golf, tennis, speed skating, racquetball, swimming, running, gymnastics, diving).  Poor strength and flexibility.  Failure to warm up properly before activity.  Family history of lower back pain or disk disorders.  Previous back injury or surgery (especially fusion).  Poor posture with lifting, especially heavy objects.  Prolonged sitting, especially with poor posture. PREVENTION   Learn and use proper posture when sitting or lifting (maintain proper posture when sitting, lift using the knees and legs, not at the waist).  Warm up and stretch properly before activity.  Allow for adequate recovery between workouts.  Maintain physical fitness:  Strength, flexibility, and endurance.  Cardiovascular fitness. PROGNOSIS  If treated properly, lower back strains usually heal within 6 weeks. RELATED COMPLICATIONS   Recurring symptoms, resulting in a chronic problem.  Chronic inflammation, scarring, and partial muscle-tendon tear.  Delayed healing or resolution of symptoms.  Prolonged disability. TREATMENT  Treatment first involves the use of ice and medicine, to reduce pain and inflammation. The use of strengthening and stretching exercises may help reduce pain with activity. These exercises may be  performed at home or with a therapist. Severe injuries may require referral to a therapist for further evaluation and treatment, such as ultrasound. Your caregiver may advise that you wear a back brace or corset, to help reduce pain and discomfort. Often, prolonged bed rest results in greater harm then  benefit. Corticosteroid injections may be recommended. However, these should be reserved for the most serious cases. It is important to avoid using your back when lifting objects. At night, sleep on your back on a firm mattress with a pillow placed under your knees. If non-surgical treatment is unsuccessful, surgery may be needed.  MEDICATION   If pain medicine is needed, nonsteroidal anti-inflammatory medicines (aspirin and ibuprofen), or other minor pain relievers (acetaminophen), are often advised.  Do not take pain medicine for 7 days before surgery.  Prescription pain relievers may be given, if your caregiver thinks they are needed. Use only as directed and only as much as you need.  Ointments applied to the skin may be helpful.  Corticosteroid injections may be given by your caregiver. These injections should be reserved for the most serious cases, because they may only be given a certain number of times. HEAT AND COLD  Cold treatment (icing) should be applied for 10 to 15 minutes every 2 to 3 hours for inflammation and pain, and immediately after activity that aggravates your symptoms. Use ice packs or an ice massage.  Heat treatment may be used before performing stretching and strengthening activities prescribed by your caregiver, physical therapist, or athletic trainer. Use a heat pack or a warm water soak. SEEK MEDICAL CARE IF:   Symptoms get worse or do not improve in 2 to 4 weeks, despite treatment.  You develop numbness, weakness, or loss of bowel or bladder function.  New, unexplained symptoms develop. (Drugs used in treatment may produce side effects.) EXERCISES  RANGE OF MOTION (ROM) AND STRETCHING EXERCISES - Low Back Strain Most people with lower back pain will find that their symptoms get worse with excessive bending forward (flexion) or arching at the lower back (extension). The exercises which will help resolve your symptoms will focus on the opposite motion.  Your  physician, physical therapist or athletic trainer will help you determine which exercises will be most helpful to resolve your lower back pain. Do not complete any exercises without first consulting with your caregiver. Discontinue any exercises which make your symptoms worse until you speak to your caregiver.  If you have pain, numbness or tingling which travels down into your buttocks, leg or foot, the goal of the therapy is for these symptoms to move closer to your back and eventually resolve. Sometimes, these leg symptoms will get better, but your lower back pain may worsen. This is typically an indication of progress in your rehabilitation. Be very alert to any changes in your symptoms and the activities in which you participated in the 24 hours prior to the change. Sharing this information with your caregiver will allow him/her to most efficiently treat your condition.  These exercises may help you when beginning to rehabilitate your injury. Your symptoms may resolve with or without further involvement from your physician, physical therapist or athletic trainer. While completing these exercises, remember:  Restoring tissue flexibility helps normal motion to return to the joints. This allows healthier, less painful movement and activity.  An effective stretch should be held for at least 30 seconds.  A stretch should never be painful. You should only feel a gentle lengthening or release in  the stretched tissue. FLEXION RANGE OF MOTION AND STRETCHING EXERCISES: STRETCH - Flexion, Single Knee to Chest   Lie on a firm bed or floor with both legs extended in front of you.  Keeping one leg in contact with the floor, bring your opposite knee to your chest. Hold your leg in place by either grabbing behind your thigh or at your knee.  Pull until you feel a gentle stretch in your lower back. Hold __________ seconds.  Slowly release your grasp and repeat the exercise with the opposite side. Repeat  __________ times. Complete this exercise __________ times per day.  STRETCH - Flexion, Double Knee to Chest   Lie on a firm bed or floor with both legs extended in front of you.  Keeping one leg in contact with the floor, bring your opposite knee to your chest.  Tense your stomach muscles to support your back and then lift your other knee to your chest. Hold your legs in place by either grabbing behind your thighs or at your knees.  Pull both knees toward your chest until you feel a gentle stretch in your lower back. Hold __________ seconds.  Tense your stomach muscles and slowly return one leg at a time to the floor. Repeat __________ times. Complete this exercise __________ times per day.  STRETCH - Low Trunk Rotation  Lie on a firm bed or floor. Keeping your legs in front of you, bend your knees so they are both pointed toward the ceiling and your feet are flat on the floor.  Extend your arms out to the side. This will stabilize your upper body by keeping your shoulders in contact with the floor.  Gently and slowly drop both knees together to one side until you feel a gentle stretch in your lower back. Hold for __________ seconds.  Tense your stomach muscles to support your lower back as you bring your knees back to the starting position. Repeat the exercise to the other side. Repeat __________ times. Complete this exercise __________ times per day  EXTENSION RANGE OF MOTION AND FLEXIBILITY EXERCISES: STRETCH - Extension, Prone on Elbows   Lie on your stomach on the floor, a bed will be too soft. Place your palms about shoulder width apart and at the height of your head.  Place your elbows under your shoulders. If this is too painful, stack pillows under your chest.  Allow your body to relax so that your hips drop lower and make contact more completely with the floor.  Hold this position for __________ seconds.  Slowly return to lying flat on the floor. Repeat __________ times.  Complete this exercise __________ times per day.  RANGE OF MOTION - Extension, Prone Press Ups  Lie on your stomach on the floor, a bed will be too soft. Place your palms about shoulder width apart and at the height of your head.  Keeping your back as relaxed as possible, slowly straighten your elbows while keeping your hips on the floor. You may adjust the placement of your hands to maximize your comfort. As you gain motion, your hands will come more underneath your shoulders.  Hold this position __________ seconds.  Slowly return to lying flat on the floor. Repeat __________ times. Complete this exercise __________ times per day.  RANGE OF MOTION- Quadruped, Neutral Spine   Assume a hands and knees position on a firm surface. Keep your hands under your shoulders and your knees under your hips. You may place padding under your knees for  comfort.  Drop your head and point your tail bone toward the ground below you. This will round out your lower back like an angry cat. Hold this position for __________ seconds.  Slowly lift your head and release your tail bone so that your back sags into a large arch, like an old horse.  Hold this position for __________ seconds.  Repeat this until you feel limber in your lower back.  Now, find your "sweet spot." This will be the most comfortable position somewhere between the two previous positions. This is your neutral spine. Once you have found this position, tense your stomach muscles to support your lower back.  Hold this position for __________ seconds. Repeat __________ times. Complete this exercise __________ times per day.  STRENGTHENING EXERCISES - Low Back Strain These exercises may help you when beginning to rehabilitate your injury. These exercises should be done near your "sweet spot." This is the neutral, low-back arch, somewhere between fully rounded and fully arched, that is your least painful position. When performed in this safe range  of motion, these exercises can be used for people who have either a flexion or extension based injury. These exercises may resolve your symptoms with or without further involvement from your physician, physical therapist or athletic trainer. While completing these exercises, remember:   Muscles can gain both the endurance and the strength needed for everyday activities through controlled exercises.  Complete these exercises as instructed by your physician, physical therapist or athletic trainer. Increase the resistance and repetitions only as guided.  You may experience muscle soreness or fatigue, but the pain or discomfort you are trying to eliminate should never worsen during these exercises. If this pain does worsen, stop and make certain you are following the directions exactly. If the pain is still present after adjustments, discontinue the exercise until you can discuss the trouble with your caregiver. STRENGTHENING - Deep Abdominals, Pelvic Tilt  Lie on a firm bed or floor. Keeping your legs in front of you, bend your knees so they are both pointed toward the ceiling and your feet are flat on the floor.  Tense your lower abdominal muscles to press your lower back into the floor. This motion will rotate your pelvis so that your tail bone is scooping upwards rather than pointing at your feet or into the floor.  With a gentle tension and even breathing, hold this position for __________ seconds. Repeat __________ times. Complete this exercise __________ times per day.  STRENGTHENING - Abdominals, Crunches   Lie on a firm bed or floor. Keeping your legs in front of you, bend your knees so they are both pointed toward the ceiling and your feet are flat on the floor. Cross your arms over your chest.  Slightly tip your chin down without bending your neck.  Tense your abdominals and slowly lift your trunk high enough to just clear your shoulder blades. Lifting higher can put excessive stress on  the lower back and does not further strengthen your abdominal muscles.  Control your return to the starting position. Repeat __________ times. Complete this exercise __________ times per day.  STRENGTHENING - Quadruped, Opposite UE/LE Lift   Assume a hands and knees position on a firm surface. Keep your hands under your shoulders and your knees under your hips. You may place padding under your knees for comfort.  Find your neutral spine and gently tense your abdominal muscles so that you can maintain this position. Your shoulders and hips should form a  rectangle that is parallel with the floor and is not twisted.  Keeping your trunk steady, lift your right hand no higher than your shoulder and then your left leg no higher than your hip. Make sure you are not holding your breath. Hold this position __________ seconds.  Continuing to keep your abdominal muscles tense and your back steady, slowly return to your starting position. Repeat with the opposite arm and leg. Repeat __________ times. Complete this exercise __________ times per day.  STRENGTHENING - Lower Abdominals, Double Knee Lift  Lie on a firm bed or floor. Keeping your legs in front of you, bend your knees so they are both pointed toward the ceiling and your feet are flat on the floor.  Tense your abdominal muscles to brace your lower back and slowly lift both of your knees until they come over your hips. Be certain not to hold your breath.  Hold __________ seconds. Using your abdominal muscles, return to the starting position in a slow and controlled manner. Repeat __________ times. Complete this exercise __________ times per day.  POSTURE AND BODY MECHANICS CONSIDERATIONS - Low Back Strain Keeping correct posture when sitting, standing or completing your activities will reduce the stress put on different body tissues, allowing injured tissues a chance to heal and limiting painful experiences. The following are general guidelines  for improved posture. Your physician or physical therapist will provide you with any instructions specific to your needs. While reading these guidelines, remember:  The exercises prescribed by your provider will help you have the flexibility and strength to maintain correct postures.  The correct posture provides the best environment for your joints to work. All of your joints have less wear and tear when properly supported by a spine with good posture. This means you will experience a healthier, less painful body.  Correct posture must be practiced with all of your activities, especially prolonged sitting and standing. Correct posture is as important when doing repetitive low-stress activities (typing) as it is when doing a single heavy-load activity (lifting). RESTING POSITIONS Consider which positions are most painful for you when choosing a resting position. If you have pain with flexion-based activities (sitting, bending, stooping, squatting), choose a position that allows you to rest in a less flexed posture. You would want to avoid curling into a fetal position on your side. If your pain worsens with extension-based activities (prolonged standing, working overhead), avoid resting in an extended position such as sleeping on your stomach. Most people will find more comfort when they rest with their spine in a more neutral position, neither too rounded nor too arched. Lying on a non-sagging bed on your side with a pillow between your knees, or on your back with a pillow under your knees will often provide some relief. Keep in mind, being in any one position for a prolonged period of time, no matter how correct your posture, can still lead to stiffness. PROPER SITTING POSTURE In order to minimize stress and discomfort on your spine, you must sit with correct posture. Sitting with good posture should be effortless for a healthy body. Returning to good posture is a gradual process. Many people can work  toward this most comfortably by using various supports until they have the flexibility and strength to maintain this posture on their own. When sitting with proper posture, your ears will fall over your shoulders and your shoulders will fall over your hips. You should use the back of the chair to support your upper back.  Your lower back will be in a neutral position, just slightly arched. You may place a small pillow or folded towel at the base of your lower back for support.  When working at a desk, create an environment that supports good, upright posture. Without extra support, muscles tire, which leads to excessive strain on joints and other tissues. Keep these recommendations in mind: CHAIR:  A chair should be able to slide under your desk when your back makes contact with the back of the chair. This allows you to work closely.  The chair's height should allow your eyes to be level with the upper part of your monitor and your hands to be slightly lower than your elbows. BODY POSITION  Your feet should make contact with the floor. If this is not possible, use a foot rest.  Keep your ears over your shoulders. This will reduce stress on your neck and lower back. INCORRECT SITTING POSTURES  If you are feeling tired and unable to assume a healthy sitting posture, do not slouch or slump. This puts excessive strain on your back tissues, causing more damage and pain. Healthier options include:  Using more support, like a lumbar pillow.  Switching tasks to something that requires you to be upright or walking.  Talking a brief walk.  Lying down to rest in a neutral-spine position. PROLONGED STANDING WHILE SLIGHTLY LEANING FORWARD  When completing a task that requires you to lean forward while standing in one place for a long time, place either foot up on a stationary 2-4 inch high object to help maintain the best posture. When both feet are on the ground, the lower back tends to lose its slight  inward curve. If this curve flattens (or becomes too large), then the back and your other joints will experience too much stress, tire more quickly, and can cause pain. CORRECT STANDING POSTURES Proper standing posture should be assumed with all daily activities, even if they only take a few moments, like when brushing your teeth. As in sitting, your ears should fall over your shoulders and your shoulders should fall over your hips. You should keep a slight tension in your abdominal muscles to brace your spine. Your tailbone should point down to the ground, not behind your body, resulting in an over-extended swayback posture.  INCORRECT STANDING POSTURES  Common incorrect standing postures include a forward head, locked knees and/or an excessive swayback. WALKING Walk with an upright posture. Your ears, shoulders and hips should all line-up. PROLONGED ACTIVITY IN A FLEXED POSITION When completing a task that requires you to bend forward at your waist or lean over a low surface, try to find a way to stabilize 3 out of 4 of your limbs. You can place a hand or elbow on your thigh or rest a knee on the surface you are reaching across. This will provide you more stability so that your muscles do not fatigue as quickly. By keeping your knees relaxed, or slightly bent, you will also reduce stress across your lower back. CORRECT LIFTING TECHNIQUES DO :   Assume a wide stance. This will provide you more stability and the opportunity to get as close as possible to the object which you are lifting.  Tense your abdominals to brace your spine. Bend at the knees and hips. Keeping your back locked in a neutral-spine position, lift using your leg muscles. Lift with your legs, keeping your back straight.  Test the weight of unknown objects before attempting to lift them.  Try to keep your elbows locked down at your sides in order get the best strength from your shoulders when carrying an object.  Always ask for  help when lifting heavy or awkward objects. INCORRECT LIFTING TECHNIQUES DO NOT:   Lock your knees when lifting, even if it is a small object.  Bend and twist. Pivot at your feet or move your feet when needing to change directions.  Assume that you can safely pick up even a paper clip without proper posture.    Abdominal Pain, Adult Many things can cause abdominal pain. Usually, abdominal pain is not caused by a disease and will improve without treatment. It can often be observed and treated at home. Your health care provider will do a physical exam and possibly order blood tests and X-rays to help determine the seriousness of your pain. However, in many cases, more time must pass before a clear cause of the pain can be found. Before that point, your health care provider may not know if you need more testing or further treatment. HOME CARE INSTRUCTIONS Monitor your abdominal pain for any changes. The following actions may help to alleviate any discomfort you are experiencing:  Only take over-the-counter or prescription medicines as directed by your health care provider.  Do not take laxatives unless directed to do so by your health care provider.  Try a clear liquid diet (broth, tea, or water) as directed by your health care provider. Slowly move to a bland diet as tolerated. SEEK MEDICAL CARE IF:  You have unexplained abdominal pain.  You have abdominal pain associated with nausea or diarrhea.  You have pain when you urinate or have a bowel movement.  You experience abdominal pain that wakes you in the night.  You have abdominal pain that is worsened or improved by eating food.  You have abdominal pain that is worsened with eating fatty foods.  You have a fever. SEEK IMMEDIATE MEDICAL CARE IF:  Your pain does not go away within 2 hours.  You keep throwing up (vomiting).  Your pain is felt only in portions of the abdomen, such as the right side or the left lower portion of  the abdomen.  You pass bloody or black tarry stools. MAKE SURE YOU:  Understand these instructions.  Will watch your condition.  Will get help right away if you are not doing well or get worse.   This information is not intended to replace advice given to you by your health care provider. Make sure you discuss any questions you have with your health care provider.   Document Released: 06/06/2005 Document Revised: 05/18/2015 Document Reviewed: 05/06/2013 [employer] Interactive Patient Education 2016 [place].  Insomnia Insomnia is a sleep disorder that makes it difficult to fall asleep or to stay asleep. Insomnia can cause tiredness (fatigue), low energy, difficulty concentrating, mood swings, and poor performance at work or school.  There are three different ways to classify insomnia:  Difficulty falling asleep.  Difficulty staying asleep.  Waking up too early in the morning. Any type of insomnia can be long-term (chronic) or short-term (acute). Both are common. Short-term insomnia usually lasts for three months or less. Chronic insomnia occurs at least three times a week for longer than three months. CAUSES  Insomnia may be caused by another condition, situation, or substance, such as:  Anxiety.  Certain medicines.  Gastroesophageal reflux disease (GERD) or other gastrointestinal conditions.  Asthma or other breathing conditions.  Restless legs syndrome, sleep apnea, or other  sleep disorders.  Chronic pain.  Menopause. This may include hot flashes.  Stroke.  Abuse of alcohol, tobacco, or illegal drugs.  Depression.  Caffeine.   Neurological disorders, such as Alzheimer disease.  An overactive thyroid (hyperthyroidism). The cause of insomnia may not be known. RISK FACTORS Risk factors for insomnia include:  Gender. Women are more commonly affected than men.  Age. Insomnia is more common as you get older.  Stress. This may involve your professional or  personal life.  Income. Insomnia is more common in people with lower income.  Lack of exercise.   Irregular work schedule or night shifts.  Traveling between different time zones. SIGNS AND SYMPTOMS If you have insomnia, trouble falling asleep or trouble staying asleep is the main symptom. This may lead to other symptoms, such as:  Feeling fatigued.  Feeling nervous about going to sleep.  Not feeling rested in the morning.  Having trouble concentrating.  Feeling irritable, anxious, or depressed. TREATMENT  Treatment for insomnia depends on the cause. If your insomnia is caused by an underlying condition, treatment will focus on addressing the condition. Treatment may also include:   Medicines to help you sleep.  Counseling or therapy.  Lifestyle adjustments. HOME CARE INSTRUCTIONS   Take medicines only as directed by your health care provider.  Keep regular sleeping and waking hours. Avoid naps.  Keep a sleep diary to help you and your health care provider figure out what could be causing your insomnia. Include:   When you sleep.  When you wake up during the night.  How well you sleep.   How rested you feel the next day.  Any side effects of medicines you are taking.  What you eat and drink.   Make your bedroom a comfortable place where it is easy to fall asleep:  Put up shades or special blackout curtains to block light from outside.  Use a white noise machine to block noise.  Keep the temperature cool.   Exercise regularly as directed by your health care provider. Avoid exercising right before bedtime.  Use relaxation techniques to manage stress. Ask your health care provider to suggest some techniques that may work well for you. These may include:  Breathing exercises.  Routines to release muscle tension.  Visualizing peaceful scenes.  Cut back on alcohol, caffeinated beverages, and cigarettes, especially close to bedtime. These can disrupt  your sleep.  Do not overeat or eat spicy foods right before bedtime. This can lead to digestive discomfort that can make it hard for you to sleep.  Limit screen use before bedtime. This includes:  Watching TV.  Using your smartphone, tablet, and computer.  Stick to a routine. This can help you fall asleep faster. Try to do a quiet activity, brush your teeth, and go to bed at the same time each night.  Get out of bed if you are still awake after 15 minutes of trying to sleep. Keep the lights down, but try reading or doing a quiet activity. When you feel sleepy, go back to bed.  Make sure that you drive carefully. Avoid driving if you feel very sleepy.  Keep all follow-up appointments as directed by your health care provider. This is important. SEEK MEDICAL CARE IF:   You are tired throughout the day or have trouble in your daily routine due to sleepiness.  You continue to have sleep problems or your sleep problems get worse. SEEK IMMEDIATE MEDICAL CARE IF:   You have serious thoughts about  hurting yourself or someone else.   This information is not intended to replace advice given to you by your health care provider. Make sure you discuss any questions you have with your health care provider.   Document Released: 08/24/2000 Document Revised: 05/18/2015 Document Reviewed: 05/28/2014 [employer] Interactive Patient Education [employer].   This information is not intended to replace advice given to you by your health care provider. Make sure you discuss any questions you have with your health care provider.   Document Released: 08/27/2005 Document Revised: 09/17/2014 Document Reviewed: 12/09/2008 [employer] Interactive Patient Education [employer].

## 2015-08-18 NOTE — Progress Notes (Addendum)
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 remember:   Muscles can gain both the endurance and the strength needed for everyday activities through controlled exercises.  Complete these exercises as instructed by your physician, physical therapist or athletic trainer. Increase the resistance and repetitions only as guided.  You may experience muscle soreness or fatigue, but the pain or discomfort you are trying to eliminate should never worsen during these exercises. If this pain does worsen, stop and make certain you are following the directions exactly. If the pain is still present after adjustments, discontinue the exercise until you can discuss the trouble with your caregiver. STRENGTHENING - Deep Abdominals, Pelvic Tilt  Lie on a firm bed or floor. Keeping your legs in front of you, bend your knees so they are both pointed toward the ceiling and your feet are flat on the floor.  Tense your lower abdominal muscles to press your  lower back into the floor. This motion will rotate your pelvis so that your tail bone is scooping upwards rather than pointing at your feet or into the floor.  With a gentle tension and even breathing, hold this position for __________ seconds. Repeat __________ times. Complete this exercise __________ times per day.  STRENGTHENING - Abdominals, Crunches   Lie on a firm bed or floor. Keeping your legs in front of you, bend your knees so they are both pointed toward the ceiling and your feet are flat on the floor. Cross your arms over your chest.  Slightly tip your chin down without bending your neck.  Tense your abdominals and slowly lift your trunk high enough to just clear your shoulder blades. Lifting higher can put excessive stress on the lower back and does not further strengthen your abdominal muscles.  Control your return to the starting position. Repeat __________ times. Complete this exercise __________ times per day.  STRENGTHENING - Quadruped, Opposite UE/LE Lift   Assume a hands and knees position on a firm surface. Keep your hands under your shoulders and your knees under your hips. You may place padding under your knees for comfort.  Find your neutral spine and gently tense your abdominal muscles so that you can maintain this position. Your shoulders and hips should form a rectangle that is parallel with the floor and is not twisted.  Keeping your trunk steady, lift your right hand no higher than your shoulder and then your left leg no higher than your hip. Make sure you are not holding your breath. Hold this position __________ seconds.  Continuing to keep your abdominal muscles tense and your back steady, slowly return to your starting position. Repeat with the opposite arm and leg. Repeat __________ times. Complete this exercise __________ times per day.  STRENGTHENING - Lower Abdominals, Double Knee Lift  Lie on a firm bed or floor. Keeping  your legs in front of you, bend  your knees so they are both pointed toward the ceiling and your feet are flat on the floor.  Tense your abdominal muscles to brace your lower back and slowly lift both of your knees until they come over your hips. Be certain not to hold your breath.  Hold __________ seconds. Using your abdominal muscles, return to the starting position in a slow and controlled manner. Repeat __________ times. Complete this exercise __________ times per day.  POSTURE AND BODY MECHANICS CONSIDERATIONS - Low Back Strain Keeping correct posture when sitting, standing or completing your activities will reduce the stress put on different body tissues, allowing injured tissues a chance to heal and limiting painful experiences. The following are general guidelines for improved posture. Your physician or physical therapist will provide you with any instructions specific to your needs. While reading these guidelines, remember:  The exercises prescribed by your provider will help you have the flexibility and strength to maintain correct postures.  The correct posture provides the best environment for your joints to work. All of your joints have less wear and tear when properly supported by a spine with good posture. This means you will experience a healthier, less painful body.  Correct posture must be practiced with all of your activities, especially prolonged sitting and standing. Correct posture is as important when doing repetitive low-stress activities (typing) as it is when doing a single heavy-load activity (lifting). RESTING POSITIONS Consider which positions are most painful for you when choosing a resting position. If you have pain with flexion-based activities (sitting, bending, stooping, squatting), choose a position that allows you to rest in a less flexed posture. You would want to avoid curling into a fetal position on your side. If your pain worsens with extension-based activities (prolonged standing, working  overhead), avoid resting in an extended position such as sleeping on your stomach. Most people will find more comfort when they rest with their spine in a more neutral position, neither too rounded nor too arched. Lying on a non-sagging bed on your side with a pillow between your knees, or on your back with a pillow under your knees will often provide some relief. Keep in mind, being in any one position for a prolonged period of time, no matter how correct your posture, can still lead to stiffness. PROPER SITTING POSTURE In order to minimize stress and discomfort on your spine, you must sit with correct posture. Sitting with good posture should be effortless for a healthy body. Returning to good posture is a gradual process. Many people can work toward this most comfortably by using various supports until they have the flexibility and strength to maintain this posture on their own. When sitting with proper posture, your ears will fall over your shoulders and your shoulders will fall over your hips. You should use the back of the chair to support your upper back. Your lower back will be in a neutral position, just slightly arched. You may place a small pillow or folded towel at the base of your lower back for support.  When working at a desk, create an environment that supports good, upright posture. Without extra support, muscles tire, which leads to excessive strain on joints and other tissues. Keep these recommendations in mind: CHAIR:  A chair should be able to slide under your desk when your back makes contact with the back of the chair. This allows you to work closely.  The chair's height should allow your  eyes to be level with the upper part of your monitor and your hands to be slightly lower than your elbows. BODY POSITION  Your feet should make contact with the floor. If this is not possible, use a foot rest.  Keep your ears over your shoulders. This will reduce stress on your neck and lower  back. INCORRECT SITTING POSTURES  If you are feeling tired and unable to assume a healthy sitting posture, do not slouch or slump. This puts excessive strain on your back tissues, causing more damage and pain. Healthier options include:  Using more support, like a lumbar pillow.  Switching tasks to something that requires you to be upright or walking.  Talking a brief walk.  Lying down to rest in a neutral-spine position. PROLONGED STANDING WHILE SLIGHTLY LEANING FORWARD  When completing a task that requires you to lean forward while standing in one place for a long time, place either foot up on a stationary 2-4 inch high object to help maintain the best posture. When both feet are on the ground, the lower back tends to lose its slight inward curve. If this curve flattens (or becomes too large), then the back and your other joints will experience too much stress, tire more quickly, and can cause pain. CORRECT STANDING POSTURES Proper standing posture should be assumed with all daily activities, even if they only take a few moments, like when brushing your teeth. As in sitting, your ears should fall over your shoulders and your shoulders should fall over your hips. You should keep a slight tension in your abdominal muscles to brace your spine. Your tailbone should point down to the ground, not behind your body, resulting in an over-extended swayback posture.  INCORRECT STANDING POSTURES  Common incorrect standing postures include a forward head, locked knees and/or an excessive swayback. WALKING Walk with an upright posture. Your ears, shoulders and hips should all line-up. PROLONGED ACTIVITY IN A FLEXED POSITION When completing a task that requires you to bend forward at your waist or lean over a low surface, try to find a way to stabilize 3 out of 4 of your limbs. You can place a hand or elbow on your thigh or rest a knee on the surface you are reaching across. This will provide you more  stability so that your muscles do not fatigue as quickly. By keeping your knees relaxed, or slightly bent, you will also reduce stress across your lower back. CORRECT LIFTING TECHNIQUES DO :   Assume a wide stance. This will provide you more stability and the opportunity to get as close as possible to the object which you are lifting.  Tense your abdominals to brace your spine. Bend at the knees and hips. Keeping your back locked in a neutral-spine position, lift using your leg muscles. Lift with your legs, keeping your back straight.  Test the weight of unknown objects before attempting to lift them.  Try to keep your elbows locked down at your sides in order get the best strength from your shoulders when carrying an object.  Always ask for help when lifting heavy or awkward objects. INCORRECT LIFTING TECHNIQUES DO NOT:   Lock your knees when lifting, even if it is a small object.  Bend and twist. Pivot at your feet or move your feet when needing to change directions.  Assume that you can safely pick up even a paper clip without proper posture.    Abdominal Pain, Adult Many things can cause abdominal pain. Usually,  abdominal pain is not caused by a disease and will improve without treatment. It can often be observed and treated at home. Your health care provider will do a physical exam and possibly order blood tests and X-rays to help determine the seriousness of your pain. However, in many cases, more time must pass before a clear cause of the pain can be found. Before that point, your health care provider may not know if you need more testing or further treatment. HOME CARE INSTRUCTIONS Monitor your abdominal pain for any changes. The following actions may help to alleviate any discomfort you are experiencing:  Only take over-the-counter or prescription medicines as directed by your health care provider.  Do not take laxatives unless directed to do so by your health care  provider.  Try a clear liquid diet (broth, tea, or water) as directed by your health care provider. Slowly move to a bland diet as tolerated. SEEK MEDICAL CARE IF:  You have unexplained abdominal pain.  You have abdominal pain associated with nausea or diarrhea.  You have pain when you urinate or have a bowel movement.  You experience abdominal pain that wakes you in the night.  You have abdominal pain that is worsened or improved by eating food.  You have abdominal pain that is worsened with eating fatty foods.  You have a fever. SEEK IMMEDIATE MEDICAL CARE IF:  Your pain does not go away within 2 hours.  You keep throwing up (vomiting).  Your pain is felt only in portions of the abdomen, such as the right side or the left lower portion of the abdomen.  You pass bloody or black tarry stools. MAKE SURE YOU:  Understand these instructions.  Will watch your condition.  Will get help right away if you are not doing well or get worse.   This information is not intended to replace advice given to you by your health care provider. Make sure you discuss any questions you have with your health care provider.   Document Released: 06/06/2005 Document Revised: 05/18/2015 Document Reviewed: 05/06/2013 Elsevier Interactive Patient Education 2016 [place].  Insomnia Insomnia is a sleep disorder that makes it difficult to fall asleep or to stay asleep. Insomnia can cause tiredness (fatigue), low energy, difficulty concentrating, mood swings, and poor performance at work or school.  There are three different ways to classify insomnia:  Difficulty falling asleep.  Difficulty staying asleep.  Waking up too early in the morning. Any type of insomnia can be long-term (chronic) or short-term (acute). Both are common. Short-term insomnia usually lasts for three months or less. Chronic insomnia occurs at least three times a week for longer than three months. CAUSES  Insomnia may be  caused by another condition, situation, or substance, such as:  Anxiety.  Certain medicines.  Gastroesophageal reflux disease (GERD) or other gastrointestinal conditions.  Asthma or other breathing conditions.  Restless legs syndrome, sleep apnea, or other sleep disorders.  Chronic pain.  Menopause. This may include hot flashes.  Stroke.  Abuse of alcohol, tobacco, or illegal drugs.  Depression.  Caffeine.   Neurological disorders, such as Alzheimer disease.  An overactive thyroid (hyperthyroidism). The cause of insomnia may not be known. RISK FACTORS Risk factors for insomnia include:  Gender. Women are more commonly affected than men.  Age. Insomnia is more common as you get older.  Stress. This may involve your professional or personal life.  Income. Insomnia is more common in people with lower income.  Lack of exercise.  Irregular work schedule or night shifts.  Traveling between different time zones. SIGNS AND SYMPTOMS If you have insomnia, trouble falling asleep or trouble staying asleep is the main symptom. This may lead to other symptoms, such as:  Feeling fatigued.  Feeling nervous about going to sleep.  Not feeling rested in the morning.  Having trouble concentrating.  Feeling irritable, anxious, or depressed. TREATMENT  Treatment for insomnia depends on the cause. If your insomnia is caused by an underlying condition, treatment will focus on addressing the condition. Treatment may also include:   Medicines to help you sleep.  Counseling or therapy.  Lifestyle adjustments. HOME CARE INSTRUCTIONS   Take medicines only as directed by your health care provider.  Keep regular sleeping and waking hours. Avoid naps.  Keep a sleep diary to help you and your health care provider figure out what could be causing your insomnia. Include:   When you sleep.  When you wake up during the night.  How well you sleep.   How rested you feel  the next day.  Any side effects of medicines you are taking.  What you eat and drink.   Make your bedroom a comfortable place where it is easy to fall asleep:  Put up shades or special blackout curtains to block light from outside.  Use a white noise machine to block noise.  Keep the temperature cool.   Exercise regularly as directed by your health care provider. Avoid exercising right before bedtime.  Use relaxation techniques to manage stress. Ask your health care provider to suggest some techniques that may work well for you. These may include:  Breathing exercises.  Routines to release muscle tension.  Visualizing peaceful scenes.  Cut back on alcohol, caffeinated beverages, and cigarettes, especially close to bedtime. These can disrupt your sleep.  Do not overeat or eat spicy foods right before bedtime. This can lead to digestive discomfort that can make it hard for you to sleep.  Limit screen use before bedtime. This includes:  Watching TV.  Using your smartphone, tablet, and computer.  Stick to a routine. This can help you fall asleep faster. Try to do a quiet activity, brush your teeth, and go to bed at the same time each night.  Get out of bed if you are still awake after 15 minutes of trying to sleep. Keep the lights down, but try reading or doing a quiet activity. When you feel sleepy, go back to bed.  Make sure that you drive carefully. Avoid driving if you feel very sleepy.  Keep all follow-up appointments as directed by your health care provider. This is important. SEEK MEDICAL CARE IF:   You are tired throughout the day or have trouble in your daily routine due to sleepiness.  You continue to have sleep problems or your sleep problems get worse. SEEK IMMEDIATE MEDICAL CARE IF:   You have serious thoughts about hurting yourself or someone else.   This information is not intended to replace advice given to you by your health care provider. Make sure  you discuss any questions you have with your health care provider.   Document Released: 08/24/2000 Document Revised: 05/18/2015 Document Reviewed: 05/28/2014 Elsevier Interactive Patient Education [employer].   This information is not intended to replace advice given to you by your health care provider. Make sure you discuss any questions you have with your health care provider.   Document Released: 08/27/2005 Document Revised: 09/17/2014 Document Reviewed: 12/09/2008 Elsevier Interactive Patient Education  2016 [place].

## 2015-08-19 ENCOUNTER — Telehealth: Payer: Self-pay | Admitting: Gastroenterology

## 2015-08-19 NOTE — Telephone Encounter (Addendum)
former Dr. Olevia Perches pt. she is requesting Dr. Silverio Decamp. She states that she went to [facility] last night regarding back pain and spasms. she says that she has not had a bm since last Monday and her stomach is now distended.she says that she has tried laxatives but that is not working. She is wanting to know if the back pain could come from all of this. Patient is also worried that she could have a blockage.

## 2015-08-19 NOTE — Telephone Encounter (Signed)
Patient had diarrhea all day 6 days ago. The next day she ate rice and the diarrhea stopped. She has not had a bowel movement since. Her diagnosis is IBS-C. She takes daily stool softener and fiber. This has kept her symptom free for a long time. She recently had to do repetitive and strenuous work which caused her to develop back pain. She was seen in urgent care for this. She is on muscle relaxers for this. No narcotics. She is concerned the constipation is connected to the back pain.  We discuss the mechanics of bearing down for a bowel movement and the muscle spasms she is having. The urgent care doctor had advised she try Miralax. We discuss titration of the Miralx until she has a bowel movement. She will call back if she acutely worsens or she fails to improve.

## 2015-09-06 ENCOUNTER — Encounter: Payer: Self-pay | Admitting: Family Medicine

## 2015-10-26 ENCOUNTER — Ambulatory Visit (INDEPENDENT_AMBULATORY_CARE_PROVIDER_SITE_OTHER): Payer: BC Managed Care – PPO | Admitting: Family Medicine

## 2015-10-26 VITALS — BP 142/87 | HR 76 | Temp 99.0°F | Resp 16 | Ht 62.0 in | Wt 142.8 lb

## 2015-10-26 DIAGNOSIS — R059 Cough, unspecified: Secondary | ICD-10-CM

## 2015-10-26 DIAGNOSIS — R05 Cough: Secondary | ICD-10-CM

## 2015-10-26 DIAGNOSIS — R52 Pain, unspecified: Secondary | ICD-10-CM | POA: Diagnosis not present

## 2015-10-26 LAB — POCT INFLUENZA A/B
INFLUENZA A, POC: NEGATIVE
INFLUENZA B, POC: NEGATIVE

## 2015-10-26 NOTE — Progress Notes (Signed)
[facility] [address] [place] [address] [phone]  Date:  10/26/2015   Name:  Jamie Levy   DOB:  November 21, 1960   MRN:  [id number]  PCP:  No primary care provider on file.    Chief Complaint: Sinusitis and other   History of Present Illness:  Jamie Levy is a 55 y.o. very pleasant female patient who presents with the following:  Here today with complaint of illness-  She has noted a "sinus infection or a cold."  She has a history of allergic fungus sinusitis and has undergone several operations.  Her doc is at [facility] and she is trying to get in touch in him to discuss.   She has been exposed to illness at home.  She has been sick for about 4 days.  She seems to be getting worse and not better but is not terribly ill overall.  She has noted cough, body aches, fatigue.  She has not been aware of a fever at home but she has not checked her temp.   She has been taking ibuprofen- did take it earlier today for the HA  She is in a relationship with her BF of over 2 years but reports that they really never have sex.  They have not had sex in 2 years and she is afraid that the relationship will end if she does not become more sexually responsive.    She does tend to have a very hard time achieving orgasm, she thinks that this is just how she is.  Estimates that she has had an orgasm 10x in her life She has been this way all her life.  She just does not feel motivated to have sexual activity with her partner or solo  She does not have a history of abuse or any trauma in the past She does not have any sexual comfort issues and does not feel that dryness is an issue She had a total hyst at age 54.  She took estrogen for 6 years and then stopped hormones.   She would like to check her thyroid today- however we ended up forgetting to do this  Patient Active Problem List   Diagnosis Date Noted  . Facial rash 05/20/2012  . Weight loss, abnormal 07/09/2011  .  Routine general medical examination at a health care facility 02/18/2011  . Skin cancer 02/16/2011  . CONSTIPATION 12/02/2009  . Flatulence, eructation, and gas pain 12/02/2009  . ABDOMINAL PAIN RIGHT LOWER QUADRANT 12/02/2009  . SLEEP APNEA 04/27/2009  . IRRITABLE BOWEL SYNDROME 03/28/2009  . RECTAL BLEEDING 03/15/2009  . HYPERLIPIDEMIA 03/11/2009  . DEPRESSION 03/11/2009  . SINUSITIS, CHRONIC 03/11/2009  . Diverticulosis of colon (without mention of hemorrhage) 03/11/2009  . NAUSEA 03/11/2009  . Abdominal pain, left lower quadrant 03/11/2009    Past Medical History  Diagnosis Date  . Unspecified sleep apnea   . Irritable bowel syndrome   . Diarrhea of presumed infectious origin   . Diverticulosis of colon (without mention of hemorrhage)   . Abdominal pain, left lower quadrant   . Other and unspecified hyperlipidemia   . Depressive disorder, not elsewhere classified   . Unspecified sinusitis (chronic)   . Allergy   . Facial ringworm May '14  . Sleep apnea   . Anxiety     Past Surgical History  Procedure Laterality Date  . Abdominal hysterectomy  1997    BSo- Fibroids  . Elbow cystectomy    .  Nasal sinus surgery      x 7 most recent June '14  . Colonoscopy      Social History  Substance Use Topics  . Smoking status: Never Smoker   . Smokeless tobacco: Never Used  . Alcohol Use: 1.8 oz/week    3 Standard drinks or equivalent per week     Comment: very rare, avoids alcohol when depressed    Family History  Problem Relation Age of Onset  . Ovarian cancer Mother   . Cancer Mother     ovarian  . Prostate cancer Father   . Colon polyps Father   . Heart disease Father     CAD/CABG/Stents  . Melanoma Father   . Depression Father     suicidal in the past  . Mental illness Father   . Colon cancer Neg Hx   . Cancer Maternal Grandmother     bladder    Allergies  Allergen Reactions  . Compazine [Prochlorperazine Maleate] Anaphylaxis    Stroke like symptoms    . Codeine Nausea And Vomiting    All pill forms cause nausea , no relief with benadryl unless IV  . Iohexol Hives and Other (See Comments)     Code: HIVES, Desc: pt developed one hive on rt upper arm and itching on upper lip after receiving 17ml omni 300, needs to be pre medicated, Onset Date: [id number] 05-17-2014 Patient able to tolerate water soluble PO contrast w/o reaction. rsm  . Morphine And Related Nausea And Vomiting     give benadryl IV side effects lighten  . Sulfamethoxazole-Trimethoprim Other (See Comments)    Other reaction(s): Makes her anxious & keeps her up all night    Medication list has been reviewed and updated.  Current Outpatient Prescriptions on File Prior to Visit  Medication Sig Dispense Refill  . BIOTIN PO Take 1 tablet by mouth every morning.    . clonazePAM (KLONOPIN) 0.5 MG tablet Take 0.5 mg by mouth 2 (two) times daily as needed for anxiety.     . fluticasone (FLONASE) 50 MCG/ACT nasal spray Place 1 spray into both nostrils daily.    Marland Kitchen loratadine (CLARITIN) 10 MG tablet Take 10 mg by mouth daily as needed for allergies.     . Multiple Vitamin (MULTIVITAMIN WITH MINERALS) TABS tablet Take 1 tablet by mouth every morning.    . simvastatin (ZOCOR) 80 MG tablet Take 1 tablet (80 mg total) by mouth at bedtime. 90 tablet 3  . valACYclovir (VALTREX) 1000 MG tablet Take 1,000 mg by mouth 2 (two) times daily.    Marland Kitchen venlafaxine (EFFEXOR) 37.5 MG tablet Take 37.5 mg by mouth 2 (two) times daily.    . budesonide (PULMICORT) 0.5 MG/2ML nebulizer solution Place 0.5 mg into the nose daily as needed (patient uses like nose drops for chronic sinusitis). Reported on 10/26/2015    . zolpidem (AMBIEN) 10 MG tablet Take 10-15 mg by mouth at bedtime as needed for sleep. Reported on 10/26/2015     No current facility-administered medications on file prior to visit.    Review of Systems:  As per HPI- otherwise negative.   Physical Examination: Filed Vitals:   10/26/15 1347   BP: 142/87  Pulse: 76  Temp: 99 F (37.2 C)  Resp: 16   Filed Vitals:   10/26/15 1347  Height: 5\' 2"  (1.575 m)  Weight: 142 lb 12.8 oz (64.774 kg)   Body mass index is 26.11 kg/(m^2). Ideal Body Weight: Weight in (lb) to have  BMI = 25: 136.4  GEN: WDWN, NAD, Non-toxic, A & O x 3, minimal overweight, looks well HEENT: Atraumatic, Normocephalic. Neck supple. No masses, No LAD.  Bilateral TM wnl, oropharynx normal.  PEERL,EOMI.  Normal exa, Ears and Nose: No external deformity. CV: RRR, No M/G/R. No JVD. No thrill. No extra heart sounds. PULM: CTA B, no wheezes, crackles, rhonchi. No retractions. No resp. distress. No accessory muscle use. EXTR: No c/c/e NEURO Normal gait.  PSYCH: Normally interactive. Conversant. Not depressed or anxious appearing.  Calm demeanor.   Results for orders placed or performed in visit on 10/26/15  POCT Influenza A/B  Result Value Ref Range   Influenza A, POC Negative Negative   Influenza B, POC Negative Negative    Assessment and Plan: Body aches - Plan: POCT Influenza A/B  Cough - Plan: POCT Influenza A/B  Here today with mild URI illness- negative for flu.  Supportive care She will talk to her ENT at [place] and will let me know if I can help Discussed her sexual concerns.  Cautioned her that hormones can have various adverse effects and other SE Would encourage her to try a vibrator to help herself learn how to achieve orgasm on her own and then try to incorporate her skills in her relationship. She is a little squeamish but will give it a try.  She will let me know if she does want to try going on hormone therapy  Signed Lamar Blinks, MD

## 2015-10-26 NOTE — Patient Instructions (Addendum)
It would be my pleasure to continue to see you as a patient at my new office, starting the last week of February.  If you prefer to remain at [facility] one of my partners will be happy to see you here.   [facility] [address] [place], [place] [address] Phone: [phone]  Try what we talked about and let me know if it does not help- if you are eventually interested in hormone therapy please let me know.

## 2015-11-08 ENCOUNTER — Telehealth: Payer: Self-pay | Admitting: Family Medicine

## 2015-11-08 DIAGNOSIS — B001 Herpesviral vesicular dermatitis: Secondary | ICD-10-CM

## 2015-11-08 MED ORDER — VALACYCLOVIR HCL 1 G PO TABS: ORAL_TABLET | ORAL | Status: DC

## 2015-11-08 NOTE — Telephone Encounter (Signed)
Pharmacy: [id number], [place]  Reason for call: pt getting cold sore and she is wanting refill to be sent in today. She said what she has is expired and she is afraid to use it. Advised her Dr. Lorelei Pont is out of office today. She is asking if she needs to go to urgent care or if we can send in for her by other provider. Please notify pt [phone].

## 2015-11-08 NOTE — Telephone Encounter (Signed)
I sent in RF for her, called and [facility]

## 2015-11-08 NOTE — Addendum Note (Signed)
Addended by: Lamar Blinks C on: 11/08/2015 12:53 PM   Modules accepted: Orders

## 2015-11-08 NOTE — Telephone Encounter (Signed)
LM to notify pt she will need appt for RX

## 2015-11-08 NOTE — Telephone Encounter (Signed)
Per chart review, Dr. Lorelei Pont has not prescribed Valtrex (which is the only potential cold sore med I see) for this pt in the past so it cannot be filled by another provider in our office. If pt wants medication filled today, she will need OV for evaluation. Otherwise, Dr. Lorelei Pont can address on her return tomorrow, at which point she may still need OV. Called pt to make her aware, no answer. LMOM for call back.

## 2016-02-10 ENCOUNTER — Telehealth: Payer: Self-pay | Admitting: Internal Medicine

## 2016-02-10 NOTE — Telephone Encounter (Signed)
Patient will come in and see Nicoletta Ba PA on 02/15/16 10:00

## 2016-02-10 NOTE — Telephone Encounter (Signed)
Patient called on call M.D. at 6:45 AM Identified herself as patient of Dr. Olevia Perches She reports that she had an episode of formed black stool today which looked black and when she wiped green. She is concerned about the color. No change in other abdominal symptoms though she does have chronic IBS which can involve constipation, intermittent loose stools worsened when she uses Adderall. Nausea intermittently which is common for her and also bloating. She did start taking new multivitamin regimen last week  Plan -- arrange visit with advanced practitioner for evaluation and to exclude melena Suspicion that dark stool is related to iron containing multivitamin regimen. She will need to establish with new MD upon seeing APP.  She can be assigned to one of our new providers or the MD covering the APP on the day of her visit

## 2016-02-14 ENCOUNTER — Ambulatory Visit (INDEPENDENT_AMBULATORY_CARE_PROVIDER_SITE_OTHER): Payer: BC Managed Care – PPO | Admitting: Physician Assistant

## 2016-02-14 ENCOUNTER — Encounter: Payer: Self-pay | Admitting: Gastroenterology

## 2016-02-14 ENCOUNTER — Encounter: Payer: Self-pay | Admitting: Physician Assistant

## 2016-02-14 ENCOUNTER — Other Ambulatory Visit (INDEPENDENT_AMBULATORY_CARE_PROVIDER_SITE_OTHER): Payer: BC Managed Care – PPO

## 2016-02-14 VITALS — BP 114/80 | HR 76 | Ht 62.5 in | Wt 134.1 lb

## 2016-02-14 DIAGNOSIS — R195 Other fecal abnormalities: Secondary | ICD-10-CM

## 2016-02-14 DIAGNOSIS — R1013 Epigastric pain: Secondary | ICD-10-CM

## 2016-02-14 DIAGNOSIS — K921 Melena: Secondary | ICD-10-CM

## 2016-02-14 DIAGNOSIS — R140 Abdominal distension (gaseous): Secondary | ICD-10-CM

## 2016-02-14 DIAGNOSIS — G8929 Other chronic pain: Secondary | ICD-10-CM

## 2016-02-14 DIAGNOSIS — R194 Change in bowel habit: Secondary | ICD-10-CM

## 2016-02-14 LAB — CBC WITH DIFFERENTIAL/PLATELET
BASOS ABS: 0 10*3/uL (ref 0.0–0.1)
Basophils Relative: 0.7 % (ref 0.0–3.0)
EOS ABS: 0.5 10*3/uL (ref 0.0–0.7)
Eosinophils Relative: 7 % — ABNORMAL HIGH (ref 0.0–5.0)
HEMATOCRIT: 41.6 % (ref 36.0–46.0)
Hemoglobin: 13.8 g/dL (ref 12.0–15.0)
LYMPHS ABS: 1.4 10*3/uL (ref 0.7–4.0)
LYMPHS PCT: 20.9 % (ref 12.0–46.0)
MCHC: 33.2 g/dL (ref 30.0–36.0)
MCV: 86.8 fl (ref 78.0–100.0)
MONO ABS: 0.6 10*3/uL (ref 0.1–1.0)
Monocytes Relative: 8.6 % (ref 3.0–12.0)
NEUTROS ABS: 4.3 10*3/uL (ref 1.4–7.7)
NEUTROS PCT: 62.8 % (ref 43.0–77.0)
PLATELETS: 286 10*3/uL (ref 150.0–400.0)
RBC: 4.8 Mil/uL (ref 3.87–5.11)
RDW: 13.4 % (ref 11.5–15.5)
WBC: 6.9 10*3/uL (ref 4.0–10.5)

## 2016-02-14 MED ORDER — NA SULFATE-K SULFATE-MG SULF 17.5-3.13-1.6 GM/177ML PO SOLN: 1.0000 | Freq: Once | ORAL | Status: DC

## 2016-02-14 NOTE — Patient Instructions (Signed)
Please go to the basement level to have your labs drawn.  Stop the aspirin.   You have been scheduled for an endoscopy and colonoscopy. Please follow the written instructions given to you at your visit today. Please pick up your prep supplies at the pharmacy within the next 1-3 days. [facility]  If you use inhalers (even only as needed), please bring them with you on the day of your procedure. Your physician has requested that you go to www.startemmi.com and enter the access code given to you at your visit today. This web site gives a general overview about your procedure. However, you should still follow specific instructions given to you by our office regarding your preparation for the procedure.
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
## 2016-02-14 NOTE — Progress Notes (Signed)
Reviewed and agree with documentation and assessment and plan. K. Veena Nandigam , MD   

## 2016-02-22 ENCOUNTER — Telehealth: Payer: Self-pay | Admitting: Gastroenterology

## 2016-02-22 NOTE — Telephone Encounter (Signed)
The crackers were before 3 pm. She will proceed with the prep. Drink lots of fluids through out the prep.

## 2016-02-23 ENCOUNTER — Encounter: Payer: Self-pay | Admitting: Gastroenterology

## 2016-02-23 ENCOUNTER — Ambulatory Visit (AMBULATORY_SURGERY_CENTER): Payer: BC Managed Care – PPO | Admitting: Gastroenterology

## 2016-02-23 VITALS — BP 123/77 | HR 56 | Temp 97.5°F | Resp 10 | Ht 62.5 in | Wt 134.0 lb

## 2016-02-23 DIAGNOSIS — D120 Benign neoplasm of cecum: Secondary | ICD-10-CM

## 2016-02-23 DIAGNOSIS — R195 Other fecal abnormalities: Secondary | ICD-10-CM

## 2016-02-23 DIAGNOSIS — D123 Benign neoplasm of transverse colon: Secondary | ICD-10-CM

## 2016-02-23 DIAGNOSIS — R194 Change in bowel habit: Secondary | ICD-10-CM | POA: Diagnosis not present

## 2016-02-23 MED ORDER — SODIUM CHLORIDE 0.9 % IV SOLN: 500.0000 mL | INTRAVENOUS | Status: DC

## 2016-02-23 NOTE — Op Note (Signed)
[facility] Patient Name: Jamie Levy Procedure Date: 02/23/2016 2:36 PM MRN: [id number] Endoscopist: Mauri Pole , MD Age: 55 Referring MD:  Date of Birth: 11-Dec-1960 Gender: Female Procedure:                Colonoscopy Indications:              Evaluation of unexplained GI bleeding Medicines:                Monitored Anesthesia Care Procedure:                Pre-Anesthesia Assessment:                           - Prior to the procedure, a History and Physical                            was performed, and patient medications and                            allergies were reviewed. The patient's tolerance of                            previous anesthesia was also reviewed. The risks                            and benefits of the procedure and the sedation                            options and risks were discussed with the patient.                            All questions were answered, and informed consent                            was obtained. Prior Anticoagulants: The patient has                            taken no previous anticoagulant or antiplatelet                            agents. ASA Grade Assessment: II - A patient with                            mild systemic disease. After reviewing the risks                            and benefits, the patient was deemed in                            satisfactory condition to undergo the procedure.                           - Prior to the procedure, a History and Physical  was performed, and patient medications and                            allergies were reviewed. The patient's tolerance of                            previous anesthesia was also reviewed. The risks                            and benefits of the procedure and the sedation                            options and risks were discussed with the patient.                            All questions were answered, and informed consent                             was obtained. Prior Anticoagulants: The patient has                            taken no previous anticoagulant or antiplatelet                            agents. ASA Grade Assessment: II - A patient with                            mild systemic disease. After reviewing the risks                            and benefits, the patient was deemed in                            satisfactory condition to undergo the procedure.                           After obtaining informed consent, the colonoscope                            was passed under direct vision. Throughout the                            procedure, the patient's blood pressure, pulse, and                            oxygen saturations were monitored continuously. The                            Model CF-HQ190L [id number]) scope was introduced                            through the anus and advanced to the the cecum,  identified by appendiceal orifice and ileocecal                            valve. The colonoscopy was performed without                            difficulty. The patient tolerated the procedure                            well. The quality of the bowel preparation was                            good. The ileocecal valve, appendiceal orifice, and                            rectum were photographed. Scope In: 2:51:50 PM Scope Out: 3:07:35 PM Scope Withdrawal Time: 0 hours 11 minutes 11 seconds  Total Procedure Duration: 0 hours 15 minutes 45 seconds  Findings:                 The perianal and digital rectal examinations were                            normal.                           Two sessile polyps were found in the transverse                            colon and cecum. The polyps were 2 to 4 mm in size.                            These polyps were removed with a cold biopsy                            forceps. Resection and retrieval were complete.                            Multiple small-mouthed diverticula were found in                            the sigmoid colon and descending colon.                           Non-bleeding internal hemorrhoids were found during                            retroflexion. The hemorrhoids were small. Complications:            No immediate complications. Estimated Blood Loss:     Estimated blood loss was minimal. Impression:               - Two 2 to 4 mm polyps in the transverse colon and  in the cecum, removed with a cold biopsy forceps.                            Resected and retrieved.                           - Diverticulosis in the sigmoid colon and in the                            descending colon.                           - Non-bleeding internal hemorrhoids.                           No source to explain possible GI blood loss from                            upper GI tract or colon. Recommendation:           - Patient has a contact number available for                            emergencies. The signs and symptoms of potential                            delayed complications were discussed with the                            patient. Return to normal activities tomorrow.                            Written discharge instructions were provided to the                            patient.                           - Resume previous diet.                           - Continue present medications.                           - Await pathology results.                           - Repeat colonoscopy in 5-10 years for surveillance                            based on pathology                           -Will consider stool cards to evaluate if continues                            to have dark stool/melena  and if positive will do                            small bowel video capsule to further evaluate.                           - Return to GI clinic PRN. Mauri Pole, MD 02/23/2016 3:17:18  PM This report has been signed electronically.

## 2016-02-23 NOTE — Progress Notes (Signed)
Called to room to assist during endoscopic procedure.  Patient ID and intended procedure confirmed with present staff. Received instructions for my participation in the procedure from the performing physician.  

## 2016-02-23 NOTE — Patient Instructions (Signed)
Impression/recommendations:  Endoscopy: Normal  Colonoscopy:  Polyps (handout given) Diverticulosis (handout given) High Fiber Diet (handout given) Hemorrhoids (handout given)  YOU HAD AN ENDOSCOPIC PROCEDURE TODAY AT [facility]:   Refer to the procedure report that was given to you for any specific questions about what was found during the examination.  If the procedure report does not answer your questions, please call your gastroenterologist to clarify.  If you requested that your care partner not be given the details of your procedure findings, then the procedure report has been included in a sealed envelope for you to review at your convenience later.  YOU SHOULD EXPECT: Some feelings of bloating in the abdomen. Passage of more gas than usual.  Walking can help get rid of the air that was put into your GI tract during the procedure and reduce the bloating. If you had a lower endoscopy (such as a colonoscopy or flexible sigmoidoscopy) you may notice spotting of blood in your stool or on the toilet paper. If you underwent a bowel prep for your procedure, you may not have a normal bowel movement for a few days.  Please Note:  You might notice some irritation and congestion in your nose or some drainage.  This is from the oxygen used during your procedure.  There is no need for concern and it should clear up in a day or so.  SYMPTOMS TO REPORT IMMEDIATELY:   Following lower endoscopy (colonoscopy or flexible sigmoidoscopy):  Excessive amounts of blood in the stool  Significant tenderness or worsening of abdominal pains  Swelling of the abdomen that is new, acute  Fever of 100F or higher   Following upper endoscopy (EGD)  Vomiting of blood or coffee ground material  New chest pain or pain under the shoulder blades  Painful or persistently difficult swallowing  New shortness of breath  Fever of 100F or higher  Black, tarry-looking stools  For urgent or emergent  issues, a gastroenterologist can be reached at any hour by calling [phone].   DIET: Your first meal following the procedure should be a small meal and then it is ok to progress to your normal diet. Heavy or fried foods are harder to digest and may make you feel nauseous or bloated.  Likewise, meals heavy in dairy and vegetables can increase bloating.  Drink plenty of fluids but you should avoid alcoholic beverages for 24 hours.  ACTIVITY:  You should plan to take it easy for the rest of today and you should NOT DRIVE or use heavy machinery until tomorrow (because of the sedation medicines used during the test).    FOLLOW UP: Our staff will call the number listed on your records the next business day following your procedure to check on you and address any questions or concerns that you may have regarding the information given to you following your procedure. If we do not reach you, we will leave a message.  However, if you are feeling well and you are not experiencing any problems, there is no need to return our call.  We will assume that you have returned to your regular daily activities without incident.  If any biopsies were taken you will be contacted by phone or by letter within the next 1-3 weeks.  Please call us at [phone] if you have not heard about the biopsies in 3 weeks.    SIGNATURES/CONFIDENTIALITY: You and/or your care partner have signed paperwork which will be entered into your electronic  medical record.  These signatures attest to the fact that that the information above on your After Visit Summary has been reviewed and is understood.  Full responsibility of the confidentiality of this discharge information lies with you and/or your care-partner.

## 2016-02-23 NOTE — Progress Notes (Signed)
Report to PACU, RN, vss, BBS= Clear.  

## 2016-02-23 NOTE — Op Note (Signed)
[facility] Patient Name: Jamie Levy Procedure Date: 02/23/2016 2:33 PM MRN: [id number] Endoscopist: Mauri Pole , MD Age: 55 Referring MD:  Date of Birth: 09-01-1961 Gender: Female Procedure:                Upper GI endoscopy Indications:              Suspected upper gastrointestinal bleeding in                            patient with unexplained iron deficiency anemia Medicines:                Monitored Anesthesia Care Procedure:                Pre-Anesthesia Assessment:                           - Prior to the procedure, a History and Physical                            was performed, and patient medications and                            allergies were reviewed. The patient's tolerance of                            previous anesthesia was also reviewed. The risks                            and benefits of the procedure and the sedation                            options and risks were discussed with the patient.                            All questions were answered, and informed consent                            was obtained. Prior Anticoagulants: The patient has                            taken no previous anticoagulant or antiplatelet                            agents. ASA Grade Assessment: II - A patient with                            mild systemic disease. After reviewing the risks                            and benefits, the patient was deemed in                            satisfactory condition to undergo the procedure.  After obtaining informed consent, the endoscope was                            passed under direct vision. Throughout the                            procedure, the patient's blood pressure, pulse, and                            oxygen saturations were monitored continuously. The                            Model GIF-[id number]) scope was introduced                            through the mouth, and  advanced to the second part                            of duodenum. The upper GI endoscopy was                            accomplished without difficulty. The patient                            tolerated the procedure well. Scope In: Scope Out: Findings:                 The esophagus was normal.                           The stomach was normal.                           The examined duodenum was normal. Complications:            No immediate complications. Estimated Blood Loss:     Estimated blood loss: none. Impression:               - Normal esophagus.                           - Normal stomach.                           - Normal examined duodenum.                           - No specimens collected. Recommendation:           - Patient has a contact number available for                            emergencies. The signs and symptoms of potential                            delayed complications were discussed with the  patient. Return to normal activities tomorrow.                            Written discharge instructions were provided to the                            patient.                           - Resume previous diet.                           - Continue present medications.                           - No repeat upper endoscopy.                           - Return to GI office PRN. Mauri Pole, MD 02/23/2016 3:13:37 PM This report has been signed electronically.

## 2016-02-24 ENCOUNTER — Telehealth: Payer: Self-pay | Admitting: Gastroenterology

## 2016-02-24 ENCOUNTER — Telehealth: Payer: Self-pay

## 2016-02-24 NOTE — Telephone Encounter (Signed)
I have left message for the patient to call back. Patient is on Effexor which can causing lightheadedness if dosing is missed. She could be dehydrated.

## 2016-02-24 NOTE — Telephone Encounter (Signed)
  Follow up Call-  Call back number 02/23/2016  Post procedure Call Back phone  # [phone]  Permission to leave phone message Yes    Patient was called for follow up after her procedure on 02/23/2016. No answer at the number given for follow up phone call. A message was left on the answering machine.

## 2016-03-01 ENCOUNTER — Encounter: Payer: Self-pay | Admitting: Gastroenterology

## 2016-03-31 ENCOUNTER — Other Ambulatory Visit: Payer: Self-pay | Admitting: Family Medicine

## 2016-04-04 NOTE — Telephone Encounter (Signed)
Klonopin has been rx by Dr. Reddy's office in the past; I don't think I have ever filled this for her.  Did not fill, called and LMOM for her. Please have pharm send request to correct provider

## 2016-04-12 ENCOUNTER — Ambulatory Visit (INDEPENDENT_AMBULATORY_CARE_PROVIDER_SITE_OTHER): Payer: BC Managed Care – PPO | Admitting: Physician Assistant

## 2016-04-12 ENCOUNTER — Encounter (HOSPITAL_COMMUNITY): Payer: Self-pay | Admitting: Emergency Medicine

## 2016-04-12 ENCOUNTER — Emergency Department (HOSPITAL_COMMUNITY)
Admission: EM | Admit: 2016-04-12 | Discharge: 2016-04-12 | Disposition: A | Discharge status: Home / Self Care | Payer: BC Managed Care – PPO | Attending: Emergency Medicine | Admitting: Emergency Medicine

## 2016-04-12 ENCOUNTER — Telehealth: Payer: Self-pay | Admitting: Family Medicine

## 2016-04-12 VITALS — BP 136/84 | HR 89 | Temp 98.3°F | Resp 18 | Ht 62.5 in | Wt 132.0 lb

## 2016-04-12 DIAGNOSIS — Z7982 Long term (current) use of aspirin: Secondary | ICD-10-CM | POA: Insufficient documentation

## 2016-04-12 DIAGNOSIS — R0789 Other chest pain: Secondary | ICD-10-CM | POA: Diagnosis not present

## 2016-04-12 DIAGNOSIS — R109 Unspecified abdominal pain: Secondary | ICD-10-CM | POA: Insufficient documentation

## 2016-04-12 DIAGNOSIS — R079 Chest pain, unspecified: Secondary | ICD-10-CM | POA: Diagnosis not present

## 2016-04-12 DIAGNOSIS — Z85828 Personal history of other malignant neoplasm of skin: Secondary | ICD-10-CM | POA: Insufficient documentation

## 2016-04-12 DIAGNOSIS — R1010 Upper abdominal pain, unspecified: Secondary | ICD-10-CM | POA: Diagnosis not present

## 2016-04-12 LAB — URINALYSIS, ROUTINE W REFLEX MICROSCOPIC
BILIRUBIN URINE: NEGATIVE
Glucose, UA: NEGATIVE mg/dL
HGB URINE DIPSTICK: NEGATIVE
KETONES UR: NEGATIVE mg/dL
NITRITE: NEGATIVE
Protein, ur: NEGATIVE mg/dL
Specific Gravity, Urine: 1.01 (ref 1.005–1.030)
pH: 6.5 (ref 5.0–8.0)

## 2016-04-12 LAB — COMPREHENSIVE METABOLIC PANEL
ALK PHOS: 58 U/L (ref 38–126)
ALT: 22 U/L (ref 14–54)
AST: 26 U/L (ref 15–41)
Albumin: 4.1 g/dL (ref 3.5–5.0)
Anion gap: 8 (ref 5–15)
BILIRUBIN TOTAL: 0.1 mg/dL — AB (ref 0.3–1.2)
CALCIUM: 9.1 mg/dL (ref 8.9–10.3)
CO2: 25 mmol/L (ref 22–32)
CREATININE: 0.79 mg/dL (ref 0.44–1.00)
Chloride: 102 mmol/L (ref 101–111)
Glucose, Bld: 80 mg/dL (ref 65–99)
Potassium: 3.8 mmol/L (ref 3.5–5.1)
Sodium: 135 mmol/L (ref 135–145)
TOTAL PROTEIN: 6.6 g/dL (ref 6.5–8.1)

## 2016-04-12 LAB — CBC
HEMATOCRIT: 39.9 % (ref 36.0–46.0)
Hemoglobin: 13.2 g/dL (ref 12.0–15.0)
MCH: 28.8 pg (ref 26.0–34.0)
MCHC: 33.1 g/dL (ref 30.0–36.0)
MCV: 86.9 fL (ref 78.0–100.0)
PLATELETS: 272 10*3/uL (ref 150–400)
RBC: 4.59 MIL/uL (ref 3.87–5.11)
RDW: 13.4 % (ref 11.5–15.5)
WBC: 5.5 10*3/uL (ref 4.0–10.5)

## 2016-04-12 LAB — URINE MICROSCOPIC-ADD ON
RBC / HPF: NONE SEEN RBC/hpf (ref 0–5)
SQUAMOUS EPITHELIAL / LPF: NONE SEEN

## 2016-04-12 LAB — I-STAT TROPONIN, ED: TROPONIN I, POC: 0 ng/mL (ref 0.00–0.08)

## 2016-04-12 MED ORDER — LORAZEPAM 2 MG/ML IJ SOLN
0.5000 mg | Freq: Once | INTRAMUSCULAR | Status: AC
  Administered 2016-04-12: 0.5 mg via INTRAVENOUS
  Filled 2016-04-12: qty 1

## 2016-04-12 MED ORDER — SODIUM CHLORIDE 0.9 % IV BOLUS (SEPSIS)
500.0000 mL | Freq: Once | INTRAVENOUS | Status: AC
  Administered 2016-04-12: 500 mL via INTRAVENOUS

## 2016-04-12 MED ORDER — ASPIRIN 81 MG PO CHEW
243.0000 mg | CHEWABLE_TABLET | Freq: Once | ORAL | Status: AC
  Administered 2016-04-12: 243 mg via ORAL

## 2016-04-12 MED ORDER — GI COCKTAIL ~~LOC~~
30.0000 mL | Freq: Once | ORAL | Status: AC
  Administered 2016-04-12: 30 mL via ORAL
  Filled 2016-04-12: qty 30

## 2016-04-12 NOTE — ED Notes (Signed)
Ok for pt to eat/drink per Dr. Tyrone Nine

## 2016-04-12 NOTE — ED Triage Notes (Signed)
The patient went to Urgent Care due to abdominal pain that radiates to her chest.  Patient said she has been nauseated but no vomiting,  Claims she has been clammy.  EMS adivsed she refused Zofran.  She did say she has "double vision" from a surgery.  She rates her pain 7/10.  Patient did take an Aspirin prior to walking to UC and UC gave her three more 81mg  Aspirin.  NO nitro. Chest pain free but has pain in her avbdomen and left side of neck.

## 2016-04-12 NOTE — Discharge Instructions (Signed)
Try zantac 150mg twice a day.  ° ° °

## 2016-04-12 NOTE — Progress Notes (Signed)
[facility] [address] [place] [address] [phone]  Date:  04/12/2016   Name:  Jamie Levy   DOB:  03-12-1961   MRN:  [id number]  PCP:  Lamar Blinks, MD    Chief Complaint: Shortness of Breath (patient had a aspirin about 30 mins ago); Abdominal Pain; Chest Pain (clampy and nausea); and Anxiety   History of Present Illness:  This is a 55 y.o. female with PMH ADD, anxiety, IBS, HLD who is presenting with upper abdominal/lower chest pain since yesterday, unsure of exact time symptoms started. Symptoms worsened today. She is having waxing/waning episodes of pain, described as sharp. Occ radiating into back and states she "feels something in my throat". Not currently having any pain. With these episodes she gets nauseated, feels clammy, gets sob, described as "almost like hyperventilating". She has had a few episodes of double vision while driving and states "it's like I shouldn't be driving". She had a face lift surgery 2-3 weeks ago, still has some bruising around her eyes and sutures still in place at hair line. She reports more problems with her vision since the surgery. Took aspirin 81 mg 1 hour ago. Dad with quadruple bypass at age 34. Not a smoker. She is prediabetic. Has hereditary high cholesterol. She reports some new stressors in her life. Her son with asperger's got in a car accident 2 weeks ago and has moved back in with her. She also had to rush her dog to the vet this morning d/t side effects to vaccines. She is teary while talking about all of this.  She had a similar episode of CP 2 years ago. Seen in ED. Cardiac work up negative. F/u cardiology outpt with Dr. Radford Pax, normal stress test at that time.  Review of Systems:  Review of Systems See HPI  Patient Active Problem List   Diagnosis Date Noted  . Facial rash 05/20/2012  . Weight loss, abnormal 07/09/2011  . Routine general medical examination at a health care facility 02/18/2011   . Skin cancer 02/16/2011  . CONSTIPATION 12/02/2009  . Flatulence, eructation, and gas pain 12/02/2009  . ABDOMINAL PAIN RIGHT LOWER QUADRANT 12/02/2009  . SLEEP APNEA 04/27/2009  . IRRITABLE BOWEL SYNDROME 03/28/2009  . RECTAL BLEEDING 03/15/2009  . HYPERLIPIDEMIA 03/11/2009  . DEPRESSION 03/11/2009  . SINUSITIS, CHRONIC 03/11/2009  . Diverticulosis of colon (without mention of hemorrhage) 03/11/2009  . NAUSEA 03/11/2009  . Abdominal pain, left lower quadrant 03/11/2009    Prior to Admission medications   Medication Sig Start Date End Date Taking? Authorizing Provider  amphetamine-dextroamphetamine (ADDERALL) 20 MG tablet Take 20 mg by mouth daily.   Yes Historical Provider, MD  aspirin 81 MG tablet Take 81 mg by mouth daily.   Yes Historical Provider, MD  Cetirizine HCl (ZYRTEC ALLERGY PO) Take 1 tablet by mouth daily.   Yes Historical Provider, MD  Cholecalciferol (VITAMIN D3) 1000 units CAPS Take 1 capsule by mouth daily. Reported on 02/23/2016   Yes Historical Provider, MD  Chromium Picolinate 200 MCG CAPS Take 1 capsule by mouth daily. Reported on 02/23/2016   Yes Historical Provider, MD  clonazePAM (KLONOPIN) 0.5 MG tablet Take 0.5 mg by mouth 2 (two) times daily as needed for anxiety.    Yes Historical Provider, MD  fluticasone (FLONASE) 50 MCG/ACT nasal spray Place 1 spray into both nostrils daily.   Yes Historical Provider, MD  ketoconazole (NIZORAL) 2 % cream apply topically as directed once daily if needed 02/08/16  Yes Historical Provider, MD  Multiple Vitamins-Calcium (ONE-A-DAY WOMENS FORMULA PO) Take 1 tablet by mouth daily. Reported on 02/23/2016   Yes Historical Provider, MD  Probiotic Product (ALIGN) 4 MG CAPS Take 1 capsule by mouth daily.   Yes Historical Provider, MD  simvastatin (ZOCOR) 80 MG tablet Take 1 tablet (80 mg total) by mouth at bedtime. 05/17/15  Yes Gay Filler Copland, MD  valACYclovir (VALTREX) 1000 MG tablet Take 2 pills, then repeat 2 pills 12 hours later.   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   Yes Historical Provider, MD  Apple Cider Vinegar 600 MG CAPS Take 1 capsule by mouth daily. Reported on 02/23/2016    Historical Provider, MD  Ascorbic Acid (VITAMIN C) 1000 MG tablet Take 1,000 mg by mouth daily. Reported on 02/23/2016    Historical Provider, MD  BIOTIN PO Take 1 tablet by mouth every morning. Reported on 02/23/2016    Historical Provider, MD  Coenzyme Q10 (CO Q 10 PO) Take 1 tablet by mouth daily. Reported on 02/23/2016    Historical Provider, MD  Grape Seed 100 MG CAPS Take 1 capsule by mouth daily. Reported on 02/23/2016    Historical Provider, MD  vitamin A 8000 UNIT capsule Take 8,000 Units by mouth daily. Reported on 02/23/2016    Historical Provider, MD  vitamin B-12 (CYANOCOBALAMIN) 1000 MCG tablet Take 1,000 mcg by mouth daily. Reported on 02/23/2016    Historical Provider, MD  vitamin E 1000 UNIT capsule Take 1,000 Units by mouth daily. Reported on 02/23/2016    Historical Provider, MD  zinc gluconate 50 MG tablet Take 50 mg by mouth daily. Reported on 02/23/2016    Historical Provider, MD    Allergies  Allergen Reactions  . Compazine [Prochlorperazine Maleate] Anaphylaxis    Stroke like symptoms   . Codeine Nausea And Vomiting    All pill forms cause nausea , no relief with benadryl unless IV  . Iohexol Hives and Other (See Comments)     Code: HIVES, Desc: pt developed one hive on rt upper arm and itching on upper lip after receiving 114ml omni 300, needs to be pre medicated, Onset Date: [id number] 05-17-2014 Patient able to tolerate water soluble PO contrast w/o reaction. rsm  . Morphine And Related Nausea And Vomiting     give benadryl IV side effects lighten  . Sulfamethoxazole-Trimethoprim Other (See Comments)    Other reaction(s): Makes her anxious & keeps her up all night    Past Surgical History:  Procedure Laterality Date  .  ABDOMINAL HYSTERECTOMY  1997   BSo- Fibroids  . COLONOSCOPY    . elbow cystectomy    . NASAL SINUS SURGERY     x 7 most recent June '14    Social History  Substance Use Topics  . Smoking status: Never Smoker  . Smokeless tobacco: Never Used  . Alcohol use 1.8 oz/week    3 Standard drinks or equivalent per week     Comment: very rare, avoids alcohol when depressed    Family History  Problem Relation Age of Onset  . Ovarian cancer Mother   . Cancer Mother     ovarian  . Prostate cancer Father   . Colon polyps Father   . Heart disease Father     CAD/CABG/Stents  . Melanoma Father   . Depression Father     suicidal in  the past  . Mental illness Father   . Cancer Maternal Grandmother     bladder  . Colon cancer Neg Hx   . Stomach cancer Neg Hx   . Esophageal cancer Neg Hx     Medication list has been reviewed and updated.  Physical Examination:  Physical Exam  Constitutional: She is oriented to person, place, and time. She appears well-developed and well-nourished. No distress.  HENT:  Head: Normocephalic and atraumatic.  Right Ear: Hearing normal.  Left Ear: Hearing normal.  Nose: Nose normal.  Mouth/Throat: Uvula is midline, oropharynx is clear and moist and mucous membranes are normal.  Eyes: Conjunctivae and EOM are normal. Pupils are equal, round, and reactive to light. Right eye exhibits no discharge. Left eye exhibits no discharge. No scleral icterus.  Bruising of upper and lower lids Sutures present at hairline  Neck: Carotid bruit is not present. No thyromegaly present.  Cardiovascular: Normal rate, regular rhythm, normal heart sounds and normal pulses.   No murmur heard. Pulmonary/Chest: Effort normal and breath sounds normal. No respiratory distress. She has no wheezes. She has no rhonchi. She has no rales. She exhibits no tenderness.  Abdominal: Soft. Normal appearance.  Mild discomfort epigastric and LUQ  Musculoskeletal: Normal range of motion.   Lymphadenopathy:       Head (right side): No submental, no submandibular and no tonsillar adenopathy present.       Head (left side): No submental, no submandibular and no tonsillar adenopathy present.    She has no cervical adenopathy.  Neurological: She is alert and oriented to person, place, and time.  Skin: Skin is warm, dry and intact. No lesion and no rash noted.  Psychiatric: She has a normal mood and affect. Her speech is normal and behavior is normal. Thought content normal.   BP 136/84   Pulse 89   Temp 98.3 F (36.8 C) (Oral)   Resp 18   Ht 5' 2.5" (1.588 m)   Wt 132 lb (59.9 kg)   SpO2 97%   BMI 23.76 kg/m   EKG interpreted with Dr. Everlene Farrier: Mild ST elevation in anterior leads  Assessment and Plan:  1. Chest pain, unspecified chest pain type CP does not sound cardiac, likely stress/GERD. However, there were some mild changes on her EKG and symptoms are concerning. In addition she has several risk factors including HLD and fam hx CAD. Discussed case with Dr. Everlene Farrier. Pt will be sent by EMS to ED for cardiac w/u. Aspirin given in office. IV started. Will have pt f/u outpt with Dr. Radford Pax. - EKG 12-Lead - aspirin chewable tablet 243 mg; Chew 3 tablets (243 mg total) by mouth once.   Benjaman Pott Drenda Freeze, MHS [facility]  04/13/2016

## 2016-04-12 NOTE — Telephone Encounter (Signed)
Completed.

## 2016-04-12 NOTE — ED Notes (Signed)
Pt ambulatory w/ steady gait to restroom. 

## 2016-04-12 NOTE — ED Provider Notes (Signed)
[facility] DEPT Provider Note   CSN: [id number] Arrival date & time: 04/12/16  1513  First Provider Contact:  First MD Initiated Contact with Patient 04/12/16 1607      History   Chief Complaint Chief Complaint  Patient presents with  . Chest Pain    The patient went to Urgent Care due to abdominal pain that radiates to her chest.  Patient said she has been nauseated but no vomiting,  Claims she has been clammy.  EMS adivsed she refused Zofran.  She did say she has "double vision" from a surgery.  She rates her pain 7/10.  Patient did take an Aspirin prior to walking to UC and UC gave her three more 81mg  Aspirin.  NO nitro. Chest pain free but has pain in her avbdomen and left side of neck.      HPI Jamie Levy is a 55 y.o. female.  Jamie Levy is a 55 yo F with a pmhx of HLD, depression, and IBS who presents with an episode of atypical chest pain today. Patient follows with GI for her IBS and recently had a colonoscopy and upper endoscopy that were normal. She describes flairs of her IBS where she experiences abdominal pain and loose stools. This morning, she said she began to experience some abdominal pain however the pain felt different than her normal episodes. It began to migrate to her chest and then to her L jaw. She says she began to feel "clammy", SOB, and nauseated. Episode occurred at rest. The pain lasted for a few hours and has now subsided. She initially went to an urgent care facility and was transferred here for further evaluation. She does report being under a lot of stress at home. Her son who suffers from Asperger syndrome was in a car accident on Monday and her dog of 10 years has been sick.  On ROS she endorses heart palpitations and feeling light headed during this episode as well. She denies LOC.       Past Medical History:  Diagnosis Date  . Abdominal pain, left lower quadrant   . Allergy   . Anxiety   . Depressive disorder, not elsewhere classified   .  Diarrhea of presumed infectious origin   . Diverticulosis of colon (without mention of hemorrhage)   . Facial ringworm May '14  . Irritable bowel syndrome   . Other and unspecified hyperlipidemia   . Sleep apnea   . Unspecified sinusitis (chronic)   . Unspecified sleep apnea     Patient Active Problem List   Diagnosis Date Noted  . Facial rash 05/20/2012  . Weight loss, abnormal 07/09/2011  . Routine general medical examination at a health care facility 02/18/2011  . Skin cancer 02/16/2011  . CONSTIPATION 12/02/2009  . Flatulence, eructation, and gas pain 12/02/2009  . ABDOMINAL PAIN RIGHT LOWER QUADRANT 12/02/2009  . SLEEP APNEA 04/27/2009  . IRRITABLE BOWEL SYNDROME 03/28/2009  . RECTAL BLEEDING 03/15/2009  . HYPERLIPIDEMIA 03/11/2009  . DEPRESSION 03/11/2009  . SINUSITIS, CHRONIC 03/11/2009  . Diverticulosis of colon (without mention of hemorrhage) 03/11/2009  . NAUSEA 03/11/2009  . Abdominal pain, left lower quadrant 03/11/2009    Past Surgical History:  Procedure Laterality Date  . ABDOMINAL HYSTERECTOMY  1997   BSo- Fibroids  . COLONOSCOPY    . elbow cystectomy    . NASAL SINUS SURGERY     x 7 most recent June '14    OB History    No data available  Home Medications    Prior to Admission medications   Medication Sig Start Date End Date Taking? Authorizing Provider  amphetamine-dextroamphetamine (ADDERALL) 20 MG tablet Take 20 mg by mouth daily.    Historical Provider, MD  Apple Cider Vinegar 600 MG CAPS Take 1 capsule by mouth daily. Reported on 02/23/2016    Historical Provider, MD  Ascorbic Acid (VITAMIN C) 1000 MG tablet Take 1,000 mg by mouth daily. Reported on 02/23/2016    Historical Provider, MD  aspirin 81 MG tablet Take 81 mg by mouth daily.    Historical Provider, MD  BIOTIN PO Take 1 tablet by mouth every morning. Reported on 02/23/2016    Historical Provider, MD  Cetirizine HCl (ZYRTEC ALLERGY PO) Take 1 tablet by mouth daily.    Historical  Provider, MD  Cholecalciferol (VITAMIN D3) 1000 units CAPS Take 1 capsule by mouth daily. Reported on 02/23/2016    Historical Provider, MD  Chromium Picolinate 200 MCG CAPS Take 1 capsule by mouth daily. Reported on 02/23/2016    Historical Provider, MD  clonazePAM (KLONOPIN) 0.5 MG tablet Take 0.5 mg by mouth 2 (two) times daily as needed for anxiety.     Historical Provider, MD  Coenzyme Q10 (CO Q 10 PO) Take 1 tablet by mouth daily. Reported on 02/23/2016    Historical Provider, MD  fluticasone (FLONASE) 50 MCG/ACT nasal spray Place 1 spray into both nostrils daily.    Historical Provider, MD  Grape Seed 100 MG CAPS Take 1 capsule by mouth daily. Reported on 02/23/2016    Historical Provider, MD  ketoconazole (NIZORAL) 2 % cream apply topically as directed once daily if needed 02/08/16   Historical Provider, MD  Multiple Vitamins-Calcium (ONE-A-DAY WOMENS FORMULA PO) Take 1 tablet by mouth daily. Reported on 02/23/2016    Historical Provider, MD  Probiotic Product (ALIGN) 4 MG CAPS Take 1 capsule by mouth daily.    Historical Provider, MD  simvastatin (ZOCOR) 80 MG tablet Take 1 tablet (80 mg total) by mouth at bedtime. 05/17/15   Gay Filler Copland, MD  valACYclovir (VALTREX) 1000 MG tablet Take 2 pills, then repeat 2 pills 12 hours later.  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    Historical Provider, MD  vitamin A 8000 UNIT capsule Take 8,000 Units by mouth daily. Reported on 02/23/2016    Historical Provider, MD  vitamin B-12 (CYANOCOBALAMIN) 1000 MCG tablet Take 1,000 mcg by mouth daily. Reported on 02/23/2016    Historical Provider, MD  vitamin E 1000 UNIT capsule Take 1,000 Units by mouth daily. Reported on 02/23/2016    Historical Provider, MD  zinc gluconate 50 MG tablet Take 50 mg by mouth daily. Reported on 02/23/2016    Historical Provider, MD    Family History Family History  Problem Relation Age of  Onset  . Ovarian cancer Mother   . Cancer Mother     ovarian  . Prostate cancer Father   . Colon polyps Father   . Heart disease Father     CAD/CABG/Stents  . Melanoma Father   . Depression Father     suicidal in the past  . Mental illness Father   . Cancer Maternal Grandmother     bladder  . Colon cancer Neg Hx   . Stomach cancer Neg Hx   . Esophageal cancer Neg Hx     Social History Social History  Substance Use Topics  . Smoking status: Never Smoker  . Smokeless tobacco: Never Used  . Alcohol use 1.8 oz/week    3 Standard drinks or equivalent per week     Comment: very rare, avoids alcohol when depressed     Allergies   Compazine [prochlorperazine maleate]; Codeine; Iohexol; Morphine and related; and Sulfamethoxazole-trimethoprim   Review of Systems Review of Systems  Constitutional: Positive for diaphoresis. Negative for activity change, appetite change, fatigue, fever and unexpected weight change.  HENT: Negative.   Eyes: Positive for visual disturbance.       Blurry vision since her surgery in July   Respiratory: Positive for shortness of breath. Negative for cough, chest tightness and wheezing.   Cardiovascular: Positive for chest pain and palpitations. Negative for leg swelling.  Gastrointestinal: Positive for abdominal pain, diarrhea and nausea. Negative for abdominal distention, blood in stool, constipation and vomiting.  Endocrine: Negative.   Genitourinary: Negative.   Musculoskeletal: Negative.   Allergic/Immunologic: Negative.   Neurological: Positive for light-headedness. Negative for dizziness, syncope and headaches.  Hematological: Negative.   Psychiatric/Behavioral: Negative.      Physical Exam Updated Vital Signs BP 148/99 (BP Location: Right Arm)   Pulse 79   Temp 98.3 F (36.8 C) (Oral)   Resp (!) 27   SpO2 97%   Physical Exam  Constitutional: She is oriented to person, place, and time. She appears well-developed and well-nourished.  No distress.  HENT:  Head: Normocephalic.  Healing incision along her scalp   Eyes: Conjunctivae are normal.  Ecchymosis below her eyes bilaterally from her surgery   Neck: Neck supple.  Cardiovascular: Normal rate, regular rhythm, normal heart sounds and intact distal pulses.   No murmur heard. Pulmonary/Chest: Effort normal and breath sounds normal. No respiratory distress.  Abdominal: Soft. There is tenderness.  Diffusely tender to palpation   Musculoskeletal: She exhibits no edema.  Neurological: She is alert and oriented to person, place, and time.  Skin: Skin is warm and dry.  Psychiatric: She has a normal mood and affect.  Nursing note and vitals reviewed.    ED Treatments / Results  Labs (all labs ordered are listed, but only abnormal results are displayed) Labs Reviewed  [facility], ED    EKG  EKG Interpretation  Date/Time:  Thursday April 12 2016 15:55:40 EDT Ventricular Rate:  76 PR Interval:    QRS Duration: 100 QT Interval:  371 QTC Calculation: 418 R Axis:   20 Text Interpretation:  Sinus rhythm No significant change since last tracing Confirmed by Maryan Rued  MD, Loree Fee ([id number]) on 04/12/2016 4:10:58 PM       Radiology No results found.  Procedures Procedures (including critical care time)  Medications Ordered in ED Medications  sodium chloride 0.9 % bolus 500 mL (500 mLs Intravenous New Bag/Given 04/12/16 1630)  gi cocktail (Maalox,Lidocaine,Donnatal) (30 mLs Oral Given 04/12/16 1630)     Initial Impression / Assessment and Plan / ED Course  I have reviewed the triage vital signs and the nursing notes.  Pertinent labs & imaging results that were available during my care of the patient were reviewed by me and considered in my medical decision making (see chart for details).  Clinical Course  Value Comment By Time  EKG 12-Lead (Reviewed) Velna Ochs, MD 08/03 1607  EKG 12-Lead (Reviewed) Velna Ochs, MD 08/03 1609   Chest pain: Atypical in presentation. EKG with no acute ischemic changes and I stat troponin 0.00.  Etiology unclear. Patient reports recent stressors at home. Given 0.5mg  IV ativan, 540ml NS bolus, and GI cocktail in ED with improvement in symptoms.  Likely multifactorial due to IBS flair and anxiety. Encourage PCP and GI f/u.   Final Clinical Impressions(s) / ED Diagnoses   Final diagnoses:  None    New Prescriptions New Prescriptions   No medications on file     Velna Ochs, MD 04/12/16 [place], MD 04/12/16 [place], MD 04/12/16 2043

## 2016-05-08 ENCOUNTER — Telehealth: Payer: Self-pay | Admitting: Gastroenterology

## 2016-06-09 ENCOUNTER — Ambulatory Visit (INDEPENDENT_AMBULATORY_CARE_PROVIDER_SITE_OTHER): Payer: BC Managed Care – PPO | Admitting: Family Medicine

## 2016-06-09 VITALS — BP 138/88 | HR 84 | Temp 98.1°F | Resp 16 | Ht 64.5 in | Wt 137.4 lb

## 2016-06-09 DIAGNOSIS — J0101 Acute recurrent maxillary sinusitis: Secondary | ICD-10-CM | POA: Diagnosis not present

## 2016-06-09 MED ORDER — AMOXICILLIN-POT CLAVULANATE 875-125 MG PO TABS: 1.0000 | ORAL_TABLET | Freq: Two times a day (BID) | ORAL | 0 refills | Status: DC

## 2016-06-09 NOTE — Progress Notes (Addendum)
   HPI  Patient presents today with sinus infection.  Patient reports 8 days of symptoms including nasal drainage of purulent mucus, bilateral maxillary pain and pressure, sinus fullness, cough, frequent throat clearing, and malaise.  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  She denies any fever, chills, sweats.  She tolerating food and fluids normally. She has a history of recurrent sinus infections with a history of 8 sinus surgeries, she is followed by ENT at [facility].  PMH: Smoking status noted ROS: Per HPI  Objective: BP 138/88   Pulse 84   Temp 98.1 F (36.7 C) (Oral)   Resp 16   Ht 5' 4.5" (1.638 m)   Wt 137 lb 6.4 oz (62.3 kg)   SpO2 98%   BMI 23.22 kg/m  Gen: NAD, alert, cooperative with exam HEENT: NCAT, L maxilary sinus pain with palp, TMs normal bilaterally, oropharynx clear Neck: No tender lymphadenopathy CV: RRR, good S1/S2, no murmur Resp: CTABL, no wheezes, non-labored Ext: No edema, warm Neuro: Alert and oriented, No gross deficits  Assessment and plan:  # Acute maxillary sinusitis Treat with Augmentin Discussed supportive care including neti pot, Flonase, daily antihistamine Return to clinic with any concerns or worsening symptoms  Offered short course of prednisone which the patient would like to defer for now.   Meds ordered this encounter  Medications  . amoxicillin-clavulanate (AUGMENTIN) 875-125 MG tablet    Sig: Take 1 tablet by mouth 2 (two) times daily.    Dispense:  20 tablet    Refill:  0    Laroy Apple, MD  06/09/2016, 8:21 AM

## 2016-06-09 NOTE — Patient Instructions (Addendum)
  Great to meet you!  Be sure to take all antibiotics and pro-biotics twice daily  Continue the nasal spray or use flonase, start a plain daily antihistamine (claritin or zyrtec)   Sinusitis, Adult Sinusitis is redness, soreness, and puffiness (inflammation) of the air pockets in the bones of your face (sinuses). The redness, soreness, and puffiness can cause air and mucus to get trapped in your sinuses. This can allow germs to grow and cause an infection.  HOME CARE   Drink enough fluids to keep your pee (urine) clear or pale yellow.  Use a humidifier in your home.  Run a hot shower to create steam in the bathroom. Sit in the bathroom with the door closed. Breathe in the steam 3-4 times a day.  Put a warm, moist washcloth on your face 3-4 times a day, or as told by your doctor.  Use salt water sprays (saline sprays) to wet the thick fluid in your nose. This can help the sinuses drain.  Only take medicine as told by your doctor. GET HELP RIGHT AWAY IF:   Your pain gets worse.  You have very bad headaches.  You are sick to your stomach (nauseous).  You throw up (vomit).  You are very sleepy (drowsy) all the time.  Your face is puffy (swollen).  Your vision changes.  You have a stiff neck.  You have trouble breathing. MAKE SURE YOU:   Understand these instructions.  Will watch your condition.  Will get help right away if you are not doing well or get worse.   This information is not intended to replace advice given to you by your health care provider. Make sure you discuss any questions you have with your health care provider.   Document Released: 02/13/2008 Document Revised: 09/17/2014 Document Reviewed: 04/01/2012 Elsevier Interactive Patient Education 2016 [employer].    IF you received an x-ray today, you will receive an invoice from [facility]. Please contact [facility] at [phone] with questions or concerns regarding your invoice.    IF you received labwork today, you will receive an invoice from [employer]. Please contact [facility] at [phone] with questions or concerns regarding your invoice.   Our billing staff will not be able to assist you with questions regarding bills from these companies.  You will be contacted with the lab results as soon as they are available. The fastest way to get your results is to activate your My Chart account. Instructions are located on the last page of this paperwork. If you have not heard from [place] regarding the results in 2 weeks, please contact this office.

## 2016-06-11 ENCOUNTER — Telehealth: Payer: Self-pay

## 2016-06-11 MED ORDER — PREDNISONE 10 MG PO TABS: ORAL_TABLET | ORAL | 0 refills | Status: DC

## 2016-06-11 NOTE — Telephone Encounter (Signed)
Pt. Advised rx sent to pharmacy. Using tylenol as well for this.

## 2016-06-11 NOTE — Telephone Encounter (Signed)
Please see 06/09/16 ov note stating MD did offer prednisone. Please advise if this is the next step and if you will prescribe.

## 2016-06-11 NOTE — Telephone Encounter (Signed)
PT [place] ON LAST VISIT AND DECLINED,BUT NOW THE HEADACHES ARE SO SEVERE SHE [place]    BEST PHONE FOR PT IS [phone]    PHARMACY [facility]

## 2016-06-11 NOTE — Telephone Encounter (Signed)
[place] with short course, Sent to pharmacy.  Laroy Apple, MD [facility] 06/11/2016, 5:01 PM

## 2016-06-23 ENCOUNTER — Other Ambulatory Visit: Payer: Self-pay

## 2016-06-23 DIAGNOSIS — E785 Hyperlipidemia, unspecified: Secondary | ICD-10-CM

## 2016-06-23 NOTE — Telephone Encounter (Signed)
Last seen 04/2015 for establish care, seen 04/2016 for acute with labs. Last lipid 2013? Needs ov.

## 2016-06-25 ENCOUNTER — Telehealth: Payer: Self-pay | Admitting: Family Medicine

## 2016-06-25 NOTE — Telephone Encounter (Signed)
Caller name: Maudie Mercury Relationship to patient: self Can be reached: [phone] Pharmacy: [id number], [place]  Reason for call: pt called in and scheduled appt for 07/09/16. She has been out of simvastatin for 1 week already. Please send in enough to get to appt date

## 2016-06-26 ENCOUNTER — Other Ambulatory Visit: Payer: Self-pay | Admitting: Emergency Medicine

## 2016-06-26 DIAGNOSIS — E785 Hyperlipidemia, unspecified: Secondary | ICD-10-CM

## 2016-06-26 MED ORDER — SIMVASTATIN 80 MG PO TABS: 80.0000 mg | ORAL_TABLET | Freq: Every day | ORAL | 3 refills | Status: DC

## 2016-06-26 NOTE — Telephone Encounter (Signed)
Tried to contact pt to inform that rx for Simvastatin has been sent to [facility]. No answer, left message for pt.

## 2016-07-09 ENCOUNTER — Ambulatory Visit (HOSPITAL_BASED_OUTPATIENT_CLINIC_OR_DEPARTMENT_OTHER)
Admission: RE | Admit: 2016-07-09 | Discharge: 2016-07-09 | Disposition: A | Discharge status: Home / Self Care | Payer: BC Managed Care – PPO | Source: Ambulatory Visit | Attending: Family Medicine | Admitting: Family Medicine

## 2016-07-09 ENCOUNTER — Ambulatory Visit (INDEPENDENT_AMBULATORY_CARE_PROVIDER_SITE_OTHER): Payer: BC Managed Care – PPO | Admitting: Family Medicine

## 2016-07-09 ENCOUNTER — Encounter: Payer: Self-pay | Admitting: Family Medicine

## 2016-07-09 VITALS — BP 124/90 | HR 72 | Temp 98.5°F | Wt 138.4 lb

## 2016-07-09 DIAGNOSIS — R071 Chest pain on breathing: Secondary | ICD-10-CM | POA: Diagnosis not present

## 2016-07-09 DIAGNOSIS — R05 Cough: Secondary | ICD-10-CM

## 2016-07-09 DIAGNOSIS — E785 Hyperlipidemia, unspecified: Secondary | ICD-10-CM | POA: Diagnosis not present

## 2016-07-09 DIAGNOSIS — R059 Cough, unspecified: Secondary | ICD-10-CM

## 2016-07-09 DIAGNOSIS — M5134 Other intervertebral disc degeneration, thoracic region: Secondary | ICD-10-CM | POA: Diagnosis not present

## 2016-07-09 DIAGNOSIS — Z119 Encounter for screening for infectious and parasitic diseases, unspecified: Secondary | ICD-10-CM | POA: Diagnosis not present

## 2016-07-09 DIAGNOSIS — Z1329 Encounter for screening for other suspected endocrine disorder: Secondary | ICD-10-CM | POA: Diagnosis not present

## 2016-07-09 LAB — CBC
HEMATOCRIT: 41.7 % (ref 36.0–46.0)
Hemoglobin: 14 g/dL (ref 12.0–15.0)
MCHC: 33.5 g/dL (ref 30.0–36.0)
MCV: 87.1 fl (ref 78.0–100.0)
PLATELETS: 287 10*3/uL (ref 150.0–400.0)
RBC: 4.79 Mil/uL (ref 3.87–5.11)
RDW: 13 % (ref 11.5–15.5)
WBC: 5.7 10*3/uL (ref 4.0–10.5)

## 2016-07-09 LAB — LIPID PANEL
CHOL/HDL RATIO: 3
CHOLESTEROL: 227 mg/dL — AB (ref 0–200)
HDL: 73.2 mg/dL (ref >39.00)
LDL CALC: 131 mg/dL — AB (ref 0–99)
NonHDL: 153.46
TRIGLYCERIDES: 110 mg/dL (ref 0.0–149.0)
VLDL: 22 mg/dL (ref 0.0–40.0)

## 2016-07-09 LAB — TSH: TSH: 1.61 u[IU]/mL (ref 0.35–4.50)

## 2016-07-09 LAB — HEPATITIS C ANTIBODY: HCV Ab: NEGATIVE

## 2016-07-09 NOTE — Patient Instructions (Addendum)
Please go for your labs, and then to the ground floor for your x-ray. Then you can go home, and I will be in touch with your blood work and films.   Please continue taking your baby aspirin daily.  Let me know if you do not get over your current cold in a week or so- Sooner if worse.  If you have any recurrent chest pain please contact me or otherwise seek care.    Getting in shape is a great idea- however your weight is actually ok!  Hold off on any serious exercise program until you have seen cardiology

## 2016-07-09 NOTE — Progress Notes (Signed)
Jamie Levy at [facility] [address] [place], [place] [address] [phone]  Date:  07/09/2016   Name:  Jamie Levy   DOB:  Jun 12, 1961   MRN:  [id number]  PCP:  Jamie Blinks, MD    Chief Complaint: Headache (felt chest congestion )   History of Present Illness:  Jamie Levy is a 55 y.o. very pleasant female patient who presents with the following:  Here today with concern about acute illness-  One month ago she was treated for recurrent sinuitis with augmentin at [facility].  I last saw her in February. She is overdue for a cholesterol check and a TSH, needs her hep C screening as well She is s/p hysterectomy.   She was seen for CP in the ER back back in August; she had a rule- out and was supposed to see cardiology in follow-up but did not do so. She did have an exercise stress test approx 10 years ago that was normal.  Her father had CABG at age 41.   She is a non- smoker Never noted any exertional chest pain  She is fasting today.    She was started on prazosin (minipress) for insomnia by her mental health provider at [facility]; she has not tried this yet however. She is also taking effexor- 3x 75 daily.   They are also rx her adderall   She has felt chest congestion and a pleuritic chest pain that started 3 days ago, and she also has a HA currently.  The CP is now resolved. No fever.  She is coughing up a little bit of material.  No GI symptoms No rashes  BP Readings from Last 3 Encounters:  07/09/16 124/90  06/09/16 138/88  04/12/16 139/94    Lab Results  Component Value Date   TSH 2.51 04/06/2011    Patient Active Problem List   Diagnosis Date Noted  . Facial rash 05/20/2012  . Weight loss, abnormal 07/09/2011  . Routine general medical examination at a health care facility 02/18/2011  . Skin cancer 02/16/2011  . CONSTIPATION 12/02/2009  . Flatulence, eructation, and gas pain 12/02/2009  . ABDOMINAL PAIN  RIGHT LOWER QUADRANT 12/02/2009  . SLEEP APNEA 04/27/2009  . IRRITABLE BOWEL SYNDROME 03/28/2009  . RECTAL BLEEDING 03/15/2009  . HYPERLIPIDEMIA 03/11/2009  . DEPRESSION 03/11/2009  . SINUSITIS, CHRONIC 03/11/2009  . Diverticulosis of colon (without mention of hemorrhage) 03/11/2009  . NAUSEA 03/11/2009  . Abdominal pain, left lower quadrant 03/11/2009    Past Medical History:  Diagnosis Date  . Abdominal pain, left lower quadrant   . Allergy   . Anxiety   . Depressive disorder, not elsewhere classified   . Diarrhea of presumed infectious origin   . Diverticulosis of colon (without mention of hemorrhage)   . Facial ringworm May '14  . Irritable bowel syndrome   . Other and unspecified hyperlipidemia   . Sleep apnea   . Unspecified sinusitis (chronic)   . Unspecified sleep apnea     Past Surgical History:  Procedure Laterality Date  . ABDOMINAL HYSTERECTOMY  1997   BSo- Fibroids  . COLONOSCOPY    . elbow cystectomy    . NASAL SINUS SURGERY     x 7 most recent June '14    Social History  Substance Use Topics  . Smoking status: Never Smoker  . Smokeless tobacco: Never Used  . Alcohol use 1.8 oz/week    3 Standard drinks  or equivalent per week     Comment: very rare, avoids alcohol when depressed    Family History  Problem Relation Age of Onset  . Ovarian cancer Mother   . Cancer Mother     ovarian  . Prostate cancer Father   . Colon polyps Father   . Heart disease Father     CAD/CABG/Stents  . Melanoma Father   . Depression Father     suicidal in the past  . Mental illness Father   . Cancer Maternal Grandmother     bladder  . Colon cancer Neg Hx   . Stomach cancer Neg Hx   . Esophageal cancer Neg Hx     Allergies  Allergen Reactions  . Compazine [Prochlorperazine Maleate] Anaphylaxis    Stroke like symptoms   . Iohexol Hives and Other (See Comments)     Code: HIVES, Desc: pt developed one hive on rt upper arm and itching on upper lip after  receiving 133ml omni 300, needs to be pre medicated, Onset Date: [id number] 05-17-2014 Patient able to tolerate water soluble PO contrast w/o reaction. rsm MUST PRE-MEDICATE WITH BENADRYL  . Codeine Nausea And Vomiting  . Buprenorphine Hcl Nausea And Vomiting     give benadryl IV side effects lighten  . Morphine And Related Nausea And Vomiting  . Sulfamethoxazole-Trimethoprim Hives and Other (See Comments)    Makes her anxious & keeps her up all night    Medication list has been reviewed and updated.  Current Outpatient Prescriptions on File Prior to Visit  Medication Sig Dispense Refill  . acetaminophen (TYLENOL) 500 MG tablet Take 500-1,000 mg by mouth every 6 (six) hours as needed for mild pain or headache.     Marland Kitchen amoxicillin-clavulanate (AUGMENTIN) 875-125 MG tablet Take 1 tablet by mouth 2 (two) times daily. 20 tablet 0  . amphetamine-dextroamphetamine (ADDERALL XR) 20 MG 24 hr capsule Take 20 mg by mouth every morning.    Marland Kitchen aspirin 81 MG tablet Take 81 mg by mouth daily.    . cetirizine (ZYRTEC) 10 MG tablet Take 10 mg by mouth daily.    . clonazePAM (KLONOPIN) 0.5 MG tablet Take 0.5 mg by mouth daily as needed for anxiety.     . fluticasone (FLONASE) 50 MCG/ACT nasal spray Place 1 spray into both nostrils daily.    . prazosin (MINIPRESS) 1 MG capsule Take 1 mg by mouth at bedtime.    . predniSONE (DELTASONE) 10 MG tablet Take 4 pills a day for 3 days, then 3 pills a day for 3 days, then 2 pills a day for 3 days, then 1 pill a day for 3 days, then stop 30 tablet 0  . simvastatin (ZOCOR) 80 MG tablet Take 1 tablet (80 mg total) by mouth at bedtime. 90 tablet 3  . valACYclovir (VALTREX) 1000 MG tablet Take 2 pills, then repeat 2 pills 12 hours later.  Use as needed for cold sore outbreak (Patient not taking: Reported on 06/09/2016) 40 tablet 1  . venlafaxine (EFFEXOR) 75 MG tablet Take 75 mg by mouth every morning.      No current facility-administered medications on file prior to visit.      Review of Systems:  As per HPI- otherwise negative.  No nausea, vomiting or diarrhea   Physical Examination: Vitals:   07/09/16 0823  BP: 124/90  Pulse: 72  Temp: 98.5 F (36.9 C)   Vitals:   07/09/16 0823  Weight: 138 lb 6.4 oz (62.8 kg)  Body mass index is 23.39 kg/m. Ideal Body Weight:    GEN: WDWN, NAD, Non-toxic, A & O x 3, looks well, normal weight HEENT: Atraumatic, Normocephalic. Neck supple. No masses, No LAD.  Bilateral TM wnl, oropharynx normal.  PEERL,EOMI.   Ears and Nose: No external deformity. CV: RRR, No M/G/R. No JVD. No thrill. No extra heart sounds. PULM: CTA B, no wheezes, crackles, rhonchi. No retractions. No resp. distress. No accessory muscle use. ABD: S, NT, ND, +BS. No rebound. No HSM. EXTR: No c/c/e NEURO Normal gait.  PSYCH: Normally interactive. Conversant. Not depressed or anxious appearing.  Calm demeanor.    EKG: NSR,  Compared with old tracings; no change  Dg Chest 2 View  Result Date: 07/09/2016 CLINICAL DATA:  Two days of pleuritic left-sided chest pain. Cough developed yesterday. Patient reports being in nonsmoker. EXAM: CHEST  2 VIEW COMPARISON:  Chest x-ray of November 26, 2014 and PA and lateral chest x-ray of Feb 06, 2014. FINDINGS: The lungs are adequately inflated. There is no focal infiltrate. The left hemidiaphragm is higher than the right but this is stable. There is no pleural effusion or pneumothorax. The heart and pulmonary vascularity are normal. The mediastinum is normal in width. There is multilevel degenerative disc space narrowing of the thoracic spine. IMPRESSION: There is no active cardiopulmonary disease. Electronically Signed   By: David  Martinique M.D.   On: 07/09/2016 09:20     Assessment and Plan: Chest pain on breathing - Plan: DG Chest 2 View, EKG 12-Lead, Ambulatory referral to Cardiology  Screening examination for infectious disease - Plan: Hepatitis C antibody  Cough - Plan: CBC, DG Chest 2 View, EKG  12-Lead  Dyslipidemia - Plan: Lipid panel  Screening for thyroid disorder - Plan: TSH  Here today with recurrent very atypical CP (now resolved) and sx of a viral illness. Her CP is now resolved, but she does have a strong family history of CAD.  Will refer to cardiology for evaluation and possible stress She is on baby asa already Will plan further follow- up pending labs. See patient instructions for more details.    Signed Jamie Blinks, MD

## 2016-07-09 NOTE — Progress Notes (Signed)
Pre visit review using our clinic review tool, if applicable. No additional management support is needed unless otherwise documented below in the visit note. 

## 2016-09-25 ENCOUNTER — Encounter: Payer: Self-pay | Admitting: Physician Assistant

## 2016-09-25 ENCOUNTER — Ambulatory Visit (INDEPENDENT_AMBULATORY_CARE_PROVIDER_SITE_OTHER): Payer: BC Managed Care – PPO | Admitting: Physician Assistant

## 2016-09-25 ENCOUNTER — Telehealth: Payer: Self-pay

## 2016-09-25 VITALS — BP 106/87 | HR 82 | Temp 98.7°F | Ht 62.0 in | Wt 138.8 lb

## 2016-09-25 DIAGNOSIS — J0101 Acute recurrent maxillary sinusitis: Secondary | ICD-10-CM

## 2016-09-25 MED ORDER — AMOXICILLIN-POT CLAVULANATE 875-125 MG PO TABS: 1.0000 | ORAL_TABLET | Freq: Two times a day (BID) | ORAL | 0 refills | Status: DC

## 2016-09-25 MED ORDER — PREDNISONE 20 MG PO TABS: 20.0000 mg | ORAL_TABLET | Freq: Two times a day (BID) | ORAL | 0 refills | Status: AC

## 2016-09-25 NOTE — Telephone Encounter (Signed)
Pt is being seen in the office today for acute visit  and wants me to inform Dr. Lorelei Pont that sleep medication wake [facility] is giving her has not been helping and has been causing her nightmares.  Pt also states she went to get genetic swab and  States that it was useless and did not find out anything significant. TL/CMA
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
## 2016-09-25 NOTE — Progress Notes (Signed)
Pre visit review using our clinic review tool, if applicable. No additional management support is needed unless otherwise documented below in the visit note. 

## 2016-09-25 NOTE — Patient Instructions (Addendum)
It was great meeting you today. Start the antibiotic and take the oral steroid as directed, with food. Let us know if your symptoms do not improve or worsen.

## 2016-09-28 ENCOUNTER — Telehealth: Payer: Self-pay | Admitting: Family Medicine

## 2016-09-28 NOTE — Telephone Encounter (Signed)
I called and spoke to patient about her symptoms. She is not feeling much improved. She is on day 2 of Augmentin and Prednisone for sinusitis. She said that the medicines have made her feel "swimmy headed" and has poor sleep (which is chronic for her), I recommended stopping the prednisone and continuing the antibiotic, and letting us know if her symptoms have not improved by the beginning of next week. Patient is agreeable.  Inda Coke PA-C @TODAY @

## 2016-09-28 NOTE — Telephone Encounter (Signed)
Jamie Levy--please advise?

## 2016-09-28 NOTE — Telephone Encounter (Signed)
Relation to [id number] Call back number: [phone]  Pharmacy: [place], [address] [phone] (Phone) [phone] (Fax     Reason for call:  Patient last seen 09/25/16 states symtopms haven't improved, patient experiencing head cold, please advise

## 2016-10-01 NOTE — Telephone Encounter (Signed)
noted 

## 2016-11-17 ENCOUNTER — Encounter: Payer: Self-pay | Admitting: Family Medicine

## 2016-11-17 DIAGNOSIS — E785 Hyperlipidemia, unspecified: Secondary | ICD-10-CM

## 2016-11-20 MED ORDER — ROSUVASTATIN CALCIUM 20 MG PO TABS: 20.0000 mg | ORAL_TABLET | Freq: Every day | ORAL | 3 refills | Status: DC

## 2016-11-20 NOTE — Addendum Note (Signed)
Addended by: Lamar Blinks C on: 11/20/2016 06:10 PM   Modules accepted: Orders

## 2016-11-27 ENCOUNTER — Encounter: Payer: Self-pay | Admitting: Family Medicine

## 2017-01-22 NOTE — Progress Notes (Addendum)
[place] at [facility] [address], [place], [place] [address] [phone]  Date:  01/23/2017   Name:  Jamie Levy   DOB:  [birth year]   MRN:  [id number]  PCP:  Darreld Mclean, MD    Chief Complaint: Follow-up (Pt here for follow up on cholesterol and to discuss meds. Will need refill on fluticasone. )   History of Present Illness:  Jamie Levy is a 56 y.o. 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    She uses fiber and align for constipation.  However she is also having to use an OTC laxative of some sort every couple of days in order to have a BM She has noted gradual worsening of constipation sx for several months.    She is not sure of the exact time duration  Sometimes her stools will be smaller in caliper but not always  She just had a colonoscopy last year- noted benign polyps.  She will follow-up in 5 years.    She had coffee this am, then ate a protein bar, then a couple of nuts   Patient Active Problem List   Diagnosis Date Noted  . Facial rash 05/20/2012  . Weight loss, abnormal 07/09/2011  . Skin cancer 02/16/2011  . CONSTIPATION 12/02/2009  . Flatulence, eructation, and gas pain 12/02/2009  . ABDOMINAL PAIN RIGHT LOWER QUADRANT 12/02/2009  . SLEEP APNEA 04/27/2009  . IRRITABLE BOWEL SYNDROME 03/28/2009  . RECTAL  BLEEDING 03/15/2009  . HYPERLIPIDEMIA 03/11/2009  . DEPRESSION 03/11/2009  . SINUSITIS, CHRONIC 03/11/2009  . Diverticulosis of colon (without mention of hemorrhage) 03/11/2009  . Abdominal pain, left lower quadrant 03/11/2009    Past Medical History:  Diagnosis Date  . Abdominal pain, left lower quadrant   . Allergy   . Anxiety   . Depressive disorder, not elsewhere classified   . Diarrhea of presumed infectious origin   . Diverticulosis of colon (without mention of hemorrhage)   . Facial ringworm May '14  . Irritable bowel syndrome   . Other and unspecified hyperlipidemia   . Sleep apnea   . Unspecified sinusitis (chronic)   . Unspecified sleep apnea     Past Surgical History:  Procedure Laterality Date  . ABDOMINAL HYSTERECTOMY  1997   BSo- Fibroids  . COLONOSCOPY    . elbow cystectomy    . NASAL SINUS SURGERY     x 7 most recent June '14    Social History  Substance Use Topics  . Smoking status: Never Smoker  . Smokeless tobacco: Never Used  . Alcohol use 1.8 oz/week    3 Standard drinks or equivalent per week     Comment: very rare, avoids alcohol when depressed    Family History  Problem Relation  Age of Onset  . Ovarian cancer Mother   . Cancer Mother        ovarian  . Prostate cancer Father   . Colon polyps Father   . Heart disease Father        CAD/CABG/Stents  . Melanoma Father   . Depression Father        suicidal in the past  . Mental illness Father   . Cancer Maternal Grandmother        bladder  . Colon cancer Neg Hx   . Stomach cancer Neg Hx   . Esophageal cancer Neg Hx     Allergies  Allergen Reactions  . Compazine [Prochlorperazine Maleate] Anaphylaxis    Stroke like symptoms   . Iohexol Hives and Other (See Comments)     Code: HIVES, Desc: pt developed one hive on rt upper arm and itching on upper lip after receiving 110ml omni 300, needs to be pre medicated, Onset Date: [id number] 05-17-2014 Patient able to tolerate water soluble PO  contrast w/o reaction. rsm MUST PRE-MEDICATE WITH BENADRYL  . Codeine Nausea And Vomiting  . Buprenorphine Hcl Nausea And Vomiting     give benadryl IV side effects lighten  . Morphine And Related Nausea And Vomiting  . Sulfamethoxazole-Trimethoprim Hives and Other (See Comments)    Makes her anxious & keeps her up all night    Medication list has been reviewed and updated.  Current Outpatient Prescriptions on File Prior to Visit  Medication Sig Dispense Refill  . aspirin 81 MG tablet Take 81 mg by mouth daily.    . cetirizine (ZYRTEC) 10 MG tablet Take 10 mg by mouth daily.    . rosuvastatin (CRESTOR) 20 MG tablet Take 1 tablet (20 mg total) by mouth daily. 90 tablet 3  . valACYclovir (VALTREX) 1000 MG tablet Take 2 pills, then repeat 2 pills 12 hours later.  Use as needed for cold sore outbreak 40 tablet 1   No current facility-administered medications on file prior to visit.     Review of Systems:  As per HPI- otherwise negative.   Physical Examination: Vitals:   01/23/17 1242  BP: 110/82  Pulse: 87  Temp: 98.3 F (36.8 C)   Vitals:   01/23/17 1242  Weight: 136 lb 6.4 oz (61.9 kg)  Height: 5\' 2"  (1.575 m)   Body mass index is 24.95 kg/m. Ideal Body Weight: Weight in (lb) to have BMI = 25: 136.4  GEN: WDWN, NAD, Non-toxic, A & O x 3, looks well, normal weight HEENT: Atraumatic, Normocephalic. Neck supple. No masses, No LAD. Ears and Nose: No external deformity. CV: RRR, No M/G/R. No JVD. No thrill. No extra heart sounds. PULM: CTA B, no wheezes, crackles, rhonchi. No retractions. No resp. distress. No accessory muscle use. ABD: S, NT, ND. No rebound. No HSM. EXTR: No c/c/e NEURO Normal gait.  PSYCH: Normally interactive. Conversant. Not depressed or anxious appearing.  Calm demeanor.    Assessment and Plan: Dyslipidemia - Plan: Lipid panel  Non-seasonal allergic rhinitis due to pollen - Plan: fluticasone (FLONASE) 50 MCG/ACT nasal spray  Screening for  deficiency anemia - Plan: CBC  Screening for diabetes mellitus - Plan: Comprehensive metabolic panel, Hemoglobin A1c  Other constipation  Refilled her nasal spray today, also labs pending as above  Recent benign colonoscopy so doubt any serious pathology causing her constipation. Will have her try adding a stool softener See patient instructions for more details.   Will plan further follow- up pending  labs.   Signed Lamar Blinks, MD  Received her labs Results for orders placed or performed in visit on 01/23/17  Lipid panel  Result Value Ref Range   Cholesterol 209 (H) 0 - 200 mg/dL   Triglycerides 120.0 0.0 - 149.0 mg/dL   HDL 73.40 >39.00 mg/dL   VLDL 24.0 0.0 - 40.0 mg/dL   LDL Cholesterol 112 (H) 0 - 99 mg/dL   Total CHOL/HDL Ratio 3    NonHDL 135.64   CBC  Result Value Ref Range   WBC 6.3 4.0 - 10.5 K/uL   RBC 4.60 3.87 - 5.11 Mil/uL   Platelets 324.0 150.0 - 400.0 K/uL   Hemoglobin 13.3 12.0 - 15.0 g/dL   HCT 40.2 36.0 - 46.0 %   MCV 87.5 78.0 - 100.0 fl   MCHC 33.0 30.0 - 36.0 g/dL   RDW 14.1 11.5 - 15.5 %  Comprehensive metabolic panel  Result Value Ref Range   Sodium 136 135 - 145 mEq/L   Potassium 3.8 3.5 - 5.1 mEq/L   Chloride 101 96 - 112 mEq/L   CO2 30 19 - 32 mEq/L   Glucose, Bld 86 70 - 99 mg/dL   BUN 9 6 - 23 mg/dL   Creatinine, Ser 0.85 0.40 - 1.20 mg/dL   Total Bilirubin 0.3 0.2 - 1.2 mg/dL   Alkaline Phosphatase 61 39 - 117 U/L   AST 24 0 - 37 U/L   ALT 18 0 - 35 U/L   Total Protein 7.1 6.0 - 8.3 g/dL   Albumin 4.4 3.5 - 5.2 g/dL   Calcium 9.3 8.4 - 10.5 mg/dL   GFR 73.00 >60.00 mL/min  Hemoglobin A1c  Result Value Ref Range   Hgb A1c MFr Bld 5.9 4.6 - 6.5 %

## 2017-01-23 ENCOUNTER — Encounter: Payer: Self-pay | Admitting: Family Medicine

## 2017-01-23 ENCOUNTER — Ambulatory Visit (INDEPENDENT_AMBULATORY_CARE_PROVIDER_SITE_OTHER): Payer: BC Managed Care – PPO | Admitting: Family Medicine

## 2017-01-23 VITALS — BP 110/82 | HR 87 | Temp 98.3°F | Ht 62.0 in | Wt 136.4 lb

## 2017-01-23 DIAGNOSIS — J301 Allergic rhinitis due to pollen: Secondary | ICD-10-CM | POA: Diagnosis not present

## 2017-01-23 DIAGNOSIS — K5909 Other constipation: Secondary | ICD-10-CM

## 2017-01-23 DIAGNOSIS — Z130 Encounter for screening for diseases of the blood and blood-forming organs and certain disorders involving the immune mechanism: Secondary | ICD-10-CM

## 2017-01-23 DIAGNOSIS — E785 Hyperlipidemia, unspecified: Secondary | ICD-10-CM

## 2017-01-23 DIAGNOSIS — Z131 Encounter for screening for diabetes mellitus: Secondary | ICD-10-CM

## 2017-01-23 LAB — COMPREHENSIVE METABOLIC PANEL
ALK PHOS: 61 U/L (ref 39–117)
ALT: 18 U/L (ref 0–35)
AST: 24 U/L (ref 0–37)
Albumin: 4.4 g/dL (ref 3.5–5.2)
BUN: 9 mg/dL (ref 6–23)
CALCIUM: 9.3 mg/dL (ref 8.4–10.5)
CHLORIDE: 101 meq/L (ref 96–112)
CO2: 30 mEq/L (ref 19–32)
Creatinine, Ser: 0.85 mg/dL (ref 0.40–1.20)
GFR: 73 mL/min (ref >60.00)
Glucose, Bld: 86 mg/dL (ref 70–99)
POTASSIUM: 3.8 meq/L (ref 3.5–5.1)
SODIUM: 136 meq/L (ref 135–145)
TOTAL PROTEIN: 7.1 g/dL (ref 6.0–8.3)
Total Bilirubin: 0.3 mg/dL (ref 0.2–1.2)

## 2017-01-23 LAB — LIPID PANEL
Cholesterol: 209 mg/dL — ABNORMAL HIGH (ref 0–200)
HDL: 73.4 mg/dL (ref >39.00)
LDL CALC: 112 mg/dL — AB (ref 0–99)
NONHDL: 135.64
Total CHOL/HDL Ratio: 3
Triglycerides: 120 mg/dL (ref 0.0–149.0)
VLDL: 24 mg/dL (ref 0.0–40.0)

## 2017-01-23 LAB — CBC
HEMATOCRIT: 40.2 % (ref 36.0–46.0)
HEMOGLOBIN: 13.3 g/dL (ref 12.0–15.0)
MCHC: 33 g/dL (ref 30.0–36.0)
MCV: 87.5 fl (ref 78.0–100.0)
PLATELETS: 324 10*3/uL (ref 150.0–400.0)
RBC: 4.6 Mil/uL (ref 3.87–5.11)
RDW: 14.1 % (ref 11.5–15.5)
WBC: 6.3 10*3/uL (ref 4.0–10.5)

## 2017-01-23 LAB — HEMOGLOBIN A1C: HEMOGLOBIN A1C: 5.9 % (ref 4.6–6.5)

## 2017-01-23 MED ORDER — FLUTICASONE PROPIONATE 50 MCG/ACT NA SUSP: 1.0000 | Freq: Every day | NASAL | 11 refills | Status: DC

## 2017-01-23 NOTE — Patient Instructions (Signed)
It was nice to see you today!  I will be in touch with your cholesterol results asap  I would suspect that your bowel changes are due to stress.  You might try adding a stool softener such as colace (docusate sodiums) daily.  Please let me know if your GI symptoms do not start to resolve

## 2017-01-24 ENCOUNTER — Telehealth: Payer: Self-pay | Admitting: Family Medicine

## 2017-01-24 NOTE — Telephone Encounter (Signed)
°  Relation to OB:OFPU Call back number: [phone]  Pharmacy:  Reason for call:  Patient was last seen 01/24/17 and states she forgot to mention vaginal discharge brownish, yellow, no odor, in need of clinical advice

## 2017-01-25 ENCOUNTER — Encounter: Payer: Self-pay | Admitting: Family Medicine

## 2017-01-25 NOTE — Telephone Encounter (Signed)
Called and LMOM- let pt know that I sent her a mychart that she can respond to

## 2017-02-05 ENCOUNTER — Emergency Department (HOSPITAL_BASED_OUTPATIENT_CLINIC_OR_DEPARTMENT_OTHER)
Admission: EM | Admit: 2017-02-05 | Discharge: 2017-02-05 | Disposition: A | Discharge status: Home / Self Care | Payer: BC Managed Care – PPO | Attending: Emergency Medicine | Admitting: Emergency Medicine

## 2017-02-05 ENCOUNTER — Encounter (HOSPITAL_BASED_OUTPATIENT_CLINIC_OR_DEPARTMENT_OTHER): Payer: Self-pay | Admitting: *Deleted

## 2017-02-05 ENCOUNTER — Emergency Department (HOSPITAL_BASED_OUTPATIENT_CLINIC_OR_DEPARTMENT_OTHER): Payer: BC Managed Care – PPO

## 2017-02-05 DIAGNOSIS — Z7982 Long term (current) use of aspirin: Secondary | ICD-10-CM | POA: Diagnosis not present

## 2017-02-05 DIAGNOSIS — Z79899 Other long term (current) drug therapy: Secondary | ICD-10-CM | POA: Diagnosis not present

## 2017-02-05 DIAGNOSIS — E785 Hyperlipidemia, unspecified: Secondary | ICD-10-CM | POA: Insufficient documentation

## 2017-02-05 DIAGNOSIS — N898 Other specified noninflammatory disorders of vagina: Secondary | ICD-10-CM | POA: Diagnosis not present

## 2017-02-05 LAB — CBC
HEMATOCRIT: 39 % (ref 36.0–46.0)
HEMOGLOBIN: 13.5 g/dL (ref 12.0–15.0)
MCH: 30.1 pg (ref 26.0–34.0)
MCHC: 34.6 g/dL (ref 30.0–36.0)
MCV: 87.1 fL (ref 78.0–100.0)
PLATELETS: 257 10*3/uL (ref 150–400)
RBC: 4.48 MIL/uL (ref 3.87–5.11)
RDW: 13.3 % (ref 11.5–15.5)
WBC: 4.8 10*3/uL (ref 4.0–10.5)

## 2017-02-05 LAB — URINALYSIS, ROUTINE W REFLEX MICROSCOPIC
Bilirubin Urine: NEGATIVE
GLUCOSE, UA: NEGATIVE mg/dL
Hgb urine dipstick: NEGATIVE
Ketones, ur: NEGATIVE mg/dL
Nitrite: NEGATIVE
Protein, ur: NEGATIVE mg/dL
Specific Gravity, Urine: 1.006 (ref 1.005–1.030)
pH: 7 (ref 5.0–8.0)

## 2017-02-05 LAB — BASIC METABOLIC PANEL
Anion gap: 9 (ref 5–15)
BUN: 7 mg/dL (ref 6–20)
CHLORIDE: 99 mmol/L — AB (ref 101–111)
CO2: 28 mmol/L (ref 22–32)
Calcium: 9.1 mg/dL (ref 8.9–10.3)
Creatinine, Ser: 0.75 mg/dL (ref 0.44–1.00)
GFR calc Af Amer: >60 mL/min (ref >60)
GFR calc non Af Amer: >60 mL/min (ref >60)
GLUCOSE: 98 mg/dL (ref 65–99)
POTASSIUM: 3.7 mmol/L (ref 3.5–5.1)
Sodium: 136 mmol/L (ref 135–145)

## 2017-02-05 LAB — WET PREP, GENITAL
CLUE CELLS WET PREP: NONE SEEN
Sperm: NONE SEEN
Trich, Wet Prep: NONE SEEN
Yeast Wet Prep HPF POC: NONE SEEN

## 2017-02-05 LAB — URINALYSIS, MICROSCOPIC (REFLEX)

## 2017-02-05 MED ORDER — SODIUM CHLORIDE 0.9 % IV BOLUS (SEPSIS)
1000.0000 mL | Freq: Once | INTRAVENOUS | Status: AC
  Administered 2017-02-05: 1000 mL via INTRAVENOUS

## 2017-02-05 MED ORDER — LORAZEPAM 2 MG/ML IJ SOLN
1.0000 mg | Freq: Once | INTRAMUSCULAR | Status: AC
  Administered 2017-02-05: 1 mg via INTRAVENOUS
  Filled 2017-02-05: qty 1

## 2017-02-05 NOTE — ED Triage Notes (Signed)
Pt c/o green vaginal discharge x 1 year, increased x 2 weeks

## 2017-02-05 NOTE — ED Notes (Signed)
ED Provider at bedside. 

## 2017-02-05 NOTE — Discharge Instructions (Signed)
It is very important that you follow-up with a gynecologist for additional evaluation

## 2017-02-05 NOTE — ED Provider Notes (Signed)
[place] [facility]PT [facility] Provider Note   CSN: [id number] Arrival date & time: 02/05/17  1319     History   Chief Complaint Chief Complaint  Patient presents with  . Vaginal Discharge    HPI Jamie Levy is a 56 y.o. female.  HPI Patient reports intermittent vaginal discharge over the past year this is been green in coloration.  She reports this week his been more constant.  She denies abdominal pain.  No weight loss.  Denies nausea vomiting diarrhea.  She's had a total hysterectomy.  She is not sexually active.  She denies fevers and chills.  No dysuria or urinary frequency.  No other complaints.   Past Medical History:  Diagnosis Date  . Abdominal pain, left lower quadrant   . Allergy   . Anxiety   . Depressive disorder, not elsewhere classified   . Diarrhea of presumed infectious origin   . Diverticulosis of colon (without mention of hemorrhage)   . Facial ringworm May '14  . Irritable bowel syndrome   . Other and unspecified hyperlipidemia   . Sleep apnea   . Unspecified sinusitis (chronic)   . Unspecified sleep apnea     Patient Active Problem List   Diagnosis Date Noted  . Facial rash 05/20/2012  . Weight loss, abnormal 07/09/2011  . Skin cancer 02/16/2011  . CONSTIPATION 12/02/2009  . Flatulence, eructation, and gas pain 12/02/2009  . ABDOMINAL PAIN RIGHT LOWER QUADRANT 12/02/2009  . SLEEP APNEA 04/27/2009  . IRRITABLE BOWEL SYNDROME 03/28/2009  . RECTAL BLEEDING 03/15/2009  . HYPERLIPIDEMIA 03/11/2009  . DEPRESSION 03/11/2009  . SINUSITIS, CHRONIC 03/11/2009  . Diverticulosis of colon (without mention of hemorrhage) 03/11/2009  . Abdominal pain, left lower quadrant 03/11/2009    Past Surgical History:  Procedure Laterality Date  . ABDOMINAL HYSTERECTOMY  1997   BSo- Fibroids  . COLONOSCOPY    . elbow cystectomy    . NASAL SINUS SURGERY     x 7 most recent June '14    OB History    No data available       Home Medications    Prior to  Admission medications   Medication Sig Start Date End Date Taking? Authorizing Provider  aspirin 81 MG tablet Take 81 mg by mouth daily.    [provider]  buPROPion (WELLBUTRIN XL) 300 MG 24 hr tablet Take 300 mg by mouth daily.    [provider]  cetirizine (ZYRTEC) 10 MG tablet Take 10 mg by mouth daily.    [provider]  diazepam (VALIUM) 2 MG tablet take 1 to 2 tablets by mouth at bedtime if needed for insomnia 01/14/17   [provider]  fluticasone (FLONASE) 50 MCG/ACT nasal spray Place 1 spray into both nostrils daily. 01/23/17   Copland, Gay Filler, MD  Probiotic Product (ALIGN) 4 MG CAPS Take 1 capsule by mouth daily.    [provider]  rosuvastatin (CRESTOR) 20 MG tablet Take 1 tablet (20 mg total) by mouth daily. 11/20/16   Copland, Gay Filler, MD  traZODone (DESYREL) 50 MG tablet take 1 to 2 tablets by mouth once daily 12/26/16   [provider]  valACYclovir (VALTREX) 1000 MG tablet Take 2 pills, then repeat 2 pills 12 hours later.  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 10/08/16   [provider]    Family  History Family History  Problem Relation Age of Onset  . Ovarian cancer Mother   . Cancer Mother        ovarian  . Prostate cancer Father   . Colon polyps Father   . Heart disease Father        CAD/CABG/Stents  . Melanoma Father   . Depression Father        suicidal in the past  . Mental illness Father   . Cancer Maternal Grandmother        bladder  . Colon cancer Neg Hx   . Stomach cancer Neg Hx   . Esophageal cancer Neg Hx     Social History Social History  Substance Use Topics  . Smoking status: Never Smoker  . Smokeless tobacco: Never Used  . Alcohol use 1.8 oz/week    3 Standard drinks or equivalent per week     Comment: very rare, avoids alcohol when depressed     Allergies     Compazine [prochlorperazine maleate]; Iohexol; Codeine; Buprenorphine hcl; Morphine and related; and Sulfamethoxazole-trimethoprim   Review of Systems Review of Systems  All other systems reviewed and are negative.    Physical Exam Updated Vital Signs BP 125/88   Pulse 80   Temp 98.6 F (37 C) (Oral)   Resp 20   Ht 5\' 2"  (1.575 m)   Wt 61.7 kg (136 lb)   SpO2 95%   BMI 24.87 kg/m   Physical Exam  Constitutional: She is oriented to person, place, and time. She appears well-developed and well-nourished.  HENT:  Head: Normocephalic.  Eyes: EOM are normal.  Neck: Normal range of motion.  Pulmonary/Chest: Effort normal.  Abdominal: Soft. She exhibits no distension. There is no tenderness.  Genitourinary:  Genitourinary Comments: Chaperone present.  Normal external genitalia.  The distal aspect of the vaginal vault there is a area of friable skin with scant bleeding and scant discharge.  No obvious large mass.  Musculoskeletal: Normal range of motion.  Neurological: She is alert and oriented to person, place, and time.  Psychiatric: She has a normal mood and affect.  Nursing note and vitals reviewed.    ED Treatments / Results  Labs (all labs ordered are listed, but only abnormal results are displayed) Labs Reviewed  WET PREP, GENITAL - Abnormal; Notable for the following:       Result Value   WBC, Wet Prep HPF POC MANY (*)    All other components within normal limits  URINALYSIS, ROUTINE W REFLEX MICROSCOPIC - Abnormal; Notable for the following:    Leukocytes, UA MODERATE (*)    All other components within normal limits  URINALYSIS, MICROSCOPIC (REFLEX) - Abnormal; Notable for the following:    Bacteria, UA RARE (*)    Squamous Epithelial / LPF 0-5 (*)    All other components within normal limits  BASIC METABOLIC PANEL - Abnormal; Notable for the following:    Chloride 99 (*)    All other components within normal limits  CBC  GC/CHLAMYDIA PROBE AMP ([facility]) NOT AT [facility]    EKG  EKG Interpretation None       Radiology Ct Abdomen Pelvis Wo Contrast  Result Date: 02/05/2017 CLINICAL DATA:  56 year old female with vaginal discharge for 1 year increasing over the past 2 weeks. Bilateral lower abdominal pain with L changes over the past week. Prior hysterectomy. Initial encounter. EXAM: CT ABDOMEN AND PELVIS WITHOUT CONTRAST TECHNIQUE: Multidetector CT imaging of the abdomen and pelvis was performed  following the standard protocol without IV contrast. COMPARISON:  11/27/2014 CT. FINDINGS: Lower chest: Basilar subsegmental atelectasis/ scarring. Slight elevated left hemidiaphragm unchanged. Heart size within normal limits. Hepatobiliary: Coarse calcification dome right lobe liver unchanged. Taking into account limitation by non contrast imaging, no worrisome hepatic lesion. No calcified gallstones. Pancreas: Taking into account limitation by non contrast imaging, no mass or inflammation Spleen: Taking into account limitation by non contrast imaging, no mass or enlargement Adrenals/Urinary Tract: No renal or ureteral obstructing stone or hydronephrosis. Taking into account limitation by non contrast imaging, no renal or adrenal lesion. No gross urinary bladder abnormality. Stomach/Bowel: Scattered colonic diverticula. No extraluminal bowel inflammatory process, free fluid or free air. Portions of bowel which are under distended with limited evaluation for detecting underlying mass. Vascular/Lymphatic: No abdominal aortic aneurysm.  No adenopathy. Reproductive: Post hysterectomy. No fistula seen to the vaginal cuff. Other: No bowel containing hernia.  Mild diastases rectus muscles. Musculoskeletal: Scoliosis with superimposed degenerative changes. No worrisome osseous lesion. IMPRESSION: Post hysterectomy.  No fistula seen to the vaginal cuff. Basilar subsegmental atelectasis. Scoliosis with superimposed degenerative changes without significant change.  Scattered colonic diverticula. No extraluminal bowel inflammatory process, free fluid or free air. Electronically Signed   By: Genia Del M.D.   On: 02/05/2017 18:22    Procedures Procedures (including critical care time)  Medications Ordered in ED Medications  sodium chloride 0.9 % bolus 1,000 mL (0 mLs Intravenous Stopped 02/05/17 1645)  LORazepam (ATIVAN) injection 1 mg (1 mg Intravenous Given 02/05/17 1620)     Initial Impression / Assessment and Plan / ED Course  I have reviewed the triage vital signs and the nursing notes.  Pertinent labs & imaging results that were available during my care of the patient were reviewed by me and considered in my medical decision making (see chart for details).     Patient informed of the abnormal vaginal lesion that will need additional workup and follow-up with a gynecologist.  This likely benefit from biopsy.  No obvious signs of vaginal infection at this time.  CT scan the abdomen and pelvis shows no obvious fistulas present.  This is slightly limited secondary lack of IV contrast secondary to IV contrast allergy.  Patient understands importance of gynecologic follow-up.  She understands that cancer resides in the differential diagnosis.  There is a family history of GYN cancer in the family  Final Clinical Impressions(s) / ED Diagnoses   Final diagnoses:  Vaginal discharge  Vaginal lesion    New Prescriptions New Prescriptions   No medications on file     Jola Schmidt, MD 02/05/17 1900

## 2017-02-06 ENCOUNTER — Telehealth: Payer: Self-pay | Admitting: Family Medicine

## 2017-02-06 LAB — GC/CHLAMYDIA PROBE AMP (~~LOC~~) NOT AT ARMC
CHLAMYDIA, DNA PROBE: NEGATIVE
Neisseria Gonorrhea: NEGATIVE

## 2017-02-06 NOTE — Telephone Encounter (Signed)
Pt would like to hear from Dr Lorelei Pont before 12:00 today if possible due to going to a funeral. Pt went to ED yesterday and based on their findings she would like Dr Lorelei Pont to recommend and refer pt to the best gynecologist she knows. Pt states had hysterectomy 1997 and her gynecologist back then is no longer around. Pt confirmed her ph on her chart as correct.

## 2017-02-06 NOTE — Telephone Encounter (Signed)
Called patient regarding Dr. Serita Grit suggestions for OB-GYN.

## 2017-02-06 NOTE — Telephone Encounter (Signed)
Message sent to Dr. Lorelei Pont for review.

## 2017-02-06 NOTE — Telephone Encounter (Signed)
Please let her know that I generally recommend [facility] or [facility].  I would trust any of the providers at these locations  Thank you!

## 2017-02-07 ENCOUNTER — Telehealth: Payer: Self-pay | Admitting: Family Medicine

## 2017-02-07 ENCOUNTER — Other Ambulatory Visit: Payer: Self-pay | Admitting: Emergency Medicine

## 2017-02-07 DIAGNOSIS — N898 Other specified noninflammatory disorders of vagina: Secondary | ICD-10-CM

## 2017-02-07 NOTE — Telephone Encounter (Signed)
Pt seen ED for vaginal problem & was told need BX. Per call to Copland she recommended [facility] in [place], pt would like to see Dian Queen if possible. copland also recommended [facility]. Pt states she called them but they say they need a referral she cannot schedule herself. Pt states needs appt ASAP since she needs a Biopsy. Please arrange appt and call pt today with appt info. 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

## 2017-02-07 NOTE — Telephone Encounter (Signed)
Referral for GYN placed.  

## 2017-02-07 NOTE — Telephone Encounter (Signed)
Jamie Levy- Can you or Dr. Lorelei Pont please enter referral ASAP for pt?

## 2017-02-14 ENCOUNTER — Ambulatory Visit: Payer: Self-pay | Admitting: Family Medicine

## 2017-03-20 ENCOUNTER — Ambulatory Visit (INDEPENDENT_AMBULATORY_CARE_PROVIDER_SITE_OTHER): Payer: BC Managed Care – PPO | Admitting: Medical

## 2017-03-20 ENCOUNTER — Telehealth: Payer: Self-pay | Admitting: Medical

## 2017-03-20 VITALS — BP 128/81 | HR 72 | Temp 99.1°F | Resp 16 | Ht 62.0 in | Wt 137.6 lb

## 2017-03-20 DIAGNOSIS — E785 Hyperlipidemia, unspecified: Secondary | ICD-10-CM

## 2017-03-20 DIAGNOSIS — T7840XA Allergy, unspecified, initial encounter: Secondary | ICD-10-CM | POA: Diagnosis not present

## 2017-03-20 DIAGNOSIS — Z780 Asymptomatic menopausal state: Secondary | ICD-10-CM | POA: Diagnosis not present

## 2017-03-20 DIAGNOSIS — M8589 Other specified disorders of bone density and structure, multiple sites: Secondary | ICD-10-CM

## 2017-03-20 DIAGNOSIS — M8580 Other specified disorders of bone density and structure, unspecified site: Secondary | ICD-10-CM | POA: Diagnosis not present

## 2017-03-20 MED ORDER — HYDROXYZINE HCL 25 MG PO TABS: 25.0000 mg | ORAL_TABLET | Freq: Three times a day (TID) | ORAL | 0 refills | Status: DC | PRN

## 2017-03-20 MED ORDER — METHYLPREDNISOLONE ACETATE 40 MG/ML IJ SUSP
40.0000 mg | Freq: Once | INTRAMUSCULAR | Status: AC
  Administered 2017-03-20: 40 mg via INTRAMUSCULAR

## 2017-03-20 MED ORDER — PREDNISONE 10 MG PO TABS: ORAL_TABLET | ORAL | 0 refills | Status: DC

## 2017-03-20 NOTE — Telephone Encounter (Signed)
Placed lipid panel and other labs today on separate note since my not was not autosaved

## 2017-03-20 NOTE — Addendum Note (Signed)
Addended by: Harl Bowie on: 03/20/2017 01:36 PM   Modules accepted: Orders

## 2017-03-20 NOTE — Patient Instructions (Addendum)
By history and exam(rash in 2 different areas) you appear to have allergic reaction to poison ivy or oak.    Depomedrol 40 mg im given today. Taper prednisone printed and got staff to send to pharmacy. Rx hydroxyzine.  Ordered labs today per pt request for her osteopenia, post-menpause status and high cholesterol  Follow up 7 days or as needed
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
## 2017-03-21 ENCOUNTER — Ambulatory Visit: Payer: BC Managed Care – PPO | Admitting: Medical

## 2017-03-21 LAB — FOLLICLE STIMULATING HORMONE: FSH: 108.7 m[IU]/mL

## 2017-03-21 LAB — LIPID PANEL
Cholesterol: 193 mg/dL (ref 0–200)
HDL: 77.1 mg/dL (ref >39.00)
LDL Cholesterol: 95 mg/dL (ref 0–99)
NonHDL: 116.1
TRIGLYCERIDES: 104 mg/dL (ref 0.0–149.0)
Total CHOL/HDL Ratio: 3
VLDL: 20.8 mg/dL (ref 0.0–40.0)

## 2017-03-21 LAB — TESTOSTERONE: Testosterone: 56.94 ng/dL — ABNORMAL HIGH (ref 15.00–40.00)

## 2017-03-21 LAB — LUTEINIZING HORMONE: LH: 40.9 m[IU]/mL

## 2017-03-21 LAB — PROGESTERONE: Progesterone: <0.5 ng/mL

## 2017-03-21 LAB — ESTRADIOL: Estradiol: 23 pg/mL

## 2017-03-24 LAB — ESTROGENS, TOTAL: Estrogen: 75.3 pg/mL

## 2017-03-26 LAB — DHEA: DHEA: 40 ng/dL — ABNORMAL LOW (ref 102–1185)

## 2017-04-30 ENCOUNTER — Telehealth: Payer: Self-pay | Admitting: Genetics

## 2017-04-30 NOTE — Telephone Encounter (Signed)
Mary from [facility] cld and canceled the pt's genetic counseling appt per request.

## 2017-05-02 ENCOUNTER — Other Ambulatory Visit: Payer: BC Managed Care – PPO

## 2017-05-02 ENCOUNTER — Encounter: Payer: BC Managed Care – PPO | Admitting: Genetics

## 2017-05-08 ENCOUNTER — Ambulatory Visit (INDEPENDENT_AMBULATORY_CARE_PROVIDER_SITE_OTHER): Payer: BC Managed Care – PPO | Admitting: Family Medicine

## 2017-05-08 VITALS — BP 114/76 | HR 80 | Temp 97.7°F | Ht 62.0 in | Wt 137.8 lb

## 2017-05-08 DIAGNOSIS — B001 Herpesviral vesicular dermatitis: Secondary | ICD-10-CM

## 2017-05-08 DIAGNOSIS — H578 Other specified disorders of eye and adnexa: Secondary | ICD-10-CM

## 2017-05-08 DIAGNOSIS — H5789 Other specified disorders of eye and adnexa: Secondary | ICD-10-CM

## 2017-05-08 MED ORDER — ERYTHROMYCIN 5 MG/GM OP OINT: 1.0000 "application " | TOPICAL_OINTMENT | Freq: Three times a day (TID) | OPHTHALMIC | 0 refills | Status: DC

## 2017-05-08 MED ORDER — VALACYCLOVIR HCL 1 G PO TABS: ORAL_TABLET | ORAL | 1 refills | Status: DC

## 2017-05-08 NOTE — Progress Notes (Addendum)
[place] at [facility] [address] [place], [place] [address] [phone]  Date:  05/08/2017   Name:  Jamie Levy   DOB:  1960/11/21   MRN:  [id number]  PCP:  Darreld Mclean, MD    Chief Complaint: Eye Problem   History of Present Illness:  Jamie Levy is a 56 y.o. very pleasant female patient who presents with the following:  The morning she awoke with her left eye red and swollen Her eye was fine yesterday.  She has been helping her son to clean out his very dirty, dusty apt yesterday and she may have gotten some dust or dirt in her eyes.   She does not wear contacts Her vision is fine- unchanged.   Her eye does not hurt, not sensitive to light. It feels like there is something in it She is otherwise well today- no fever, chills, ST, cough, GI symptoms  Patient Active Problem List   Diagnosis Date Noted  . Facial rash 05/20/2012  . Weight loss, abnormal 07/09/2011  . Skin cancer 02/16/2011  . CONSTIPATION 12/02/2009  . Flatulence, eructation, and gas pain 12/02/2009  . ABDOMINAL PAIN RIGHT LOWER QUADRANT 12/02/2009  . SLEEP APNEA 04/27/2009  . IRRITABLE BOWEL SYNDROME 03/28/2009  . RECTAL BLEEDING 03/15/2009  . HYPERLIPIDEMIA 03/11/2009  . DEPRESSION 03/11/2009  . SINUSITIS, CHRONIC 03/11/2009  . Diverticulosis of colon (without mention of hemorrhage) 03/11/2009  . Abdominal pain, left lower quadrant 03/11/2009    Past Medical History:  Diagnosis Date  . Abdominal pain, left lower quadrant   . Allergy   . Anxiety   . Depressive disorder, not elsewhere classified   . Diarrhea of presumed infectious origin   . Diverticulosis of colon (without mention of hemorrhage)   . Facial ringworm May '14  . Irritable bowel syndrome   . Other and unspecified hyperlipidemia   . Sleep apnea   . Unspecified sinusitis (chronic)   . Unspecified sleep apnea     Past Surgical History:  Procedure Laterality Date   . ABDOMINAL HYSTERECTOMY  1997   BSo- Fibroids  . COLONOSCOPY    . elbow cystectomy    . NASAL SINUS SURGERY     x 7 most recent June '14    Social History  Substance Use Topics  . Smoking status: Never Smoker  . Smokeless tobacco: Never Used  . Alcohol use 1.8 oz/week    3 Standard drinks or equivalent per week     Comment: very rare, avoids alcohol when depressed    Family History  Problem Relation Age of Onset  . Ovarian cancer Mother   . Cancer Mother        ovarian  . Prostate cancer Father   . Colon polyps Father   . Heart disease Father        CAD/CABG/Stents  . Melanoma Father   . Depression Father        suicidal in the past  . Mental illness Father   . Cancer Maternal Grandmother        bladder  . Colon cancer Neg Hx   . Stomach cancer Neg Hx   . Esophageal cancer Neg Hx     Allergies  Allergen Reactions  . Compazine [Prochlorperazine Maleate] Anaphylaxis    Stroke like symptoms   . Iohexol Hives and Other (See Comments)     Code: HIVES, Desc: pt developed one hive on rt upper arm  and itching on upper lip after receiving 172ml omni 300, needs to be pre medicated, Onset Date: [id number] 05-17-2014 Patient able to tolerate water soluble PO contrast w/o reaction. rsm MUST PRE-MEDICATE WITH BENADRYL  . Codeine Nausea And Vomiting  . Buprenorphine Hcl Nausea And Vomiting     give benadryl IV side effects lighten  . Morphine And Related Nausea And Vomiting  . Sulfamethoxazole-Trimethoprim Hives and Other (See Comments)    Makes her anxious & keeps her up all night    Medication list has been reviewed and updated.  Current Outpatient Prescriptions on File Prior to Visit  Medication Sig Dispense Refill  . aspirin 81 MG tablet Take 81 mg by mouth daily.    Marland Kitchen buPROPion (WELLBUTRIN XL) 300 MG 24 hr tablet Take 300 mg by mouth daily.    . cetirizine (ZYRTEC) 10 MG tablet Take 10 mg by mouth daily.    . diazepam (VALIUM) 2 MG tablet take 1 to 2 tablets by mouth  at bedtime if needed for insomnia  0  . fluticasone (FLONASE) 50 MCG/ACT nasal spray Place 1 spray into both nostrils daily. 16 g 11  . Probiotic Product (ALIGN) 4 MG CAPS Take 1 capsule by mouth daily.    . rosuvastatin (CRESTOR) 20 MG tablet Take 1 tablet (20 mg total) by mouth daily. 90 tablet 3  . traZODone (DESYREL) 50 MG tablet take 1 to 2 tablets by mouth once daily  0  . valACYclovir (VALTREX) 1000 MG tablet Take 2 pills, then repeat 2 pills 12 hours later.  Use as needed for cold sore outbreak 40 tablet 1  . venlafaxine XR (EFFEXOR-XR) 75 MG 24 hr capsule Take 2 capsules by mouth daily.    . hydrOXYzine (ATARAX/VISTARIL) 25 MG tablet Take 1 tablet (25 mg total) by mouth 3 (three) times daily as needed. (Patient not taking: Reported on 05/08/2017) 21 tablet 0  . predniSONE (DELTASONE) 10 MG tablet 5 TAB PO DAY 1 4 TAB PO DAY 2 3 TAB PO DAY 3 2 TAB PO DAY 4 1 TAB PO DAY 5 (Patient not taking: Reported on 05/08/2017) 15 tablet 0   No current facility-administered medications on file prior to visit.     Review of Systems:  As per HPI- otherwise negative.   Physical Examination: Vitals:   05/08/17 1101  BP: 114/76  Pulse: 80  Temp: 97.7 F (36.5 C)  SpO2: 97%   Vitals:   05/08/17 1101  Weight: 137 lb 12.8 oz (62.5 kg)  Height: 5\' 2"  (1.575 m)   Body mass index is 25.2 kg/m. Ideal Body Weight: Weight in (lb) to have BMI = 25: 136.4  GEN: WDWN, NAD, Non-toxic, A & O x 3, looks well except for left eye  HEENT: Atraumatic, Normocephalic. Neck supple. No masses, No LAD.  Bilateral TM wnl, oropharynx normal.  PEERL,EOMI.   Left lower lid is swollen and conjunctiae are slightly inflamed, chemosis is present.  Globe is not tender, no pain with movement of eye (tracking vision) Eye is running but not crusty Ears and Nose: No external deformity. CV: RRR, No M/G/R. No JVD. No thrill. No extra heart sounds. PULM: CTA B, no wheezes, crackles, rhonchi. No retractions. No resp.  distress. No accessory muscle use. EXTR: No c/c/e NEURO Normal gait.  PSYCH: Normally interactive. Conversant. Not depressed or anxious appearing.  Calm demeanor.   Right eye 20/40, left eye 20/20 Assessment and Plan: Eye swelling, left - Plan: erythromycin ophthalmic ointment  Cold sore -  Plan: valACYclovir (VALTREX) 1000 MG tablet  Here today with concern about acute swelling of her left eye.  Most consistent with an allergic or mild bacterial infection Gave her samples of allegra to use, and erythromycin opthalmic ointment refiled her valtrex which she uses for occasional cold sores She will alert me if her eye is not improving by tomorrow- Sooner if worse.   addnd 8/30- pt called back, eye feels worse.  [employer] and they will see pt at 1pm.  Appreciate their help with this nice pt.  Pt alerted of appt Fax records- [phone] fax Meds ordered this encounter  Medications  . valACYclovir (VALTREX) 1000 MG tablet    Sig: Take 2 pills, then repeat 2 pills 12 hours later.  Use as needed for cold sore outbreak    Dispense:  40 tablet    Refill:  1  . erythromycin ophthalmic ointment    Sig: Place 1 application into the left eye 3 (three) times daily. May use up to 6x a day as needed to lubricate eye    Dispense:  3.5 g    Refill:  0      Signed Lamar Blinks, MD

## 2017-05-08 NOTE — Progress Notes (Signed)
Pre visit review using our clinic tool,if applicable. No additional management support is needed unless otherwise documented below in the visit note.  

## 2017-05-08 NOTE — Patient Instructions (Signed)
Your eye swelling may be due to irritation/ allergy or perhaps an infection.   Please use the allegra samples I gave you, and also apply cool compresses to your eye to reduce swelling Use the erythromycin ointment as needed - but at least 3x a day- to lubricate your eye  If your eye is not improving in about 24 hours please let us know as we will need to get you in with an eye doctor Sooner if worse.  I also refilled your cold sore medicatoin

## 2017-05-09 ENCOUNTER — Telehealth: Payer: Self-pay | Admitting: Family Medicine

## 2017-05-09 NOTE — Telephone Encounter (Signed)
Pt called in with worsening eye symptoms  Will set her up to see opthalmology   [place] fax 1 pm [facility] Pt alerted

## 2017-05-09 NOTE — Telephone Encounter (Signed)
Pt called in to follow up with

## 2017-05-09 NOTE — Telephone Encounter (Signed)
Pt called in to follow up with PCP. She said that she was advised to. Pt was seen the other day by provider and told to follow up if needed. She says that it now feels like something is in her eye. She would like to have a referral to a eye dr as discussed with pcp.

## 2017-10-05 NOTE — Progress Notes (Signed)
ERROR

## 2017-10-07 ENCOUNTER — Encounter: Payer: Self-pay | Admitting: Family Medicine

## 2017-10-07 ENCOUNTER — Ambulatory Visit: Payer: 59 | Admitting: Family Medicine

## 2017-10-07 VITALS — BP 110/78 | HR 82 | Temp 98.0°F | Ht 62.0 in | Wt 142.8 lb

## 2017-10-07 DIAGNOSIS — R1011 Right upper quadrant pain: Secondary | ICD-10-CM

## 2017-10-07 DIAGNOSIS — R7303 Prediabetes: Secondary | ICD-10-CM | POA: Diagnosis not present

## 2017-10-07 DIAGNOSIS — J301 Allergic rhinitis due to pollen: Secondary | ICD-10-CM | POA: Diagnosis not present

## 2017-10-07 DIAGNOSIS — R5383 Other fatigue: Secondary | ICD-10-CM | POA: Diagnosis not present

## 2017-10-07 DIAGNOSIS — K219 Gastro-esophageal reflux disease without esophagitis: Secondary | ICD-10-CM | POA: Diagnosis not present

## 2017-10-07 DIAGNOSIS — M542 Cervicalgia: Secondary | ICD-10-CM

## 2017-10-07 DIAGNOSIS — R635 Abnormal weight gain: Secondary | ICD-10-CM

## 2017-10-07 LAB — COMPREHENSIVE METABOLIC PANEL
ALBUMIN: 4.5 g/dL (ref 3.5–5.2)
ALK PHOS: 54 U/L (ref 39–117)
ALT: 24 U/L (ref 0–35)
AST: 24 U/L (ref 0–37)
BUN: 9 mg/dL (ref 6–23)
CHLORIDE: 103 meq/L (ref 96–112)
CO2: 31 mEq/L (ref 19–32)
Calcium: 9.6 mg/dL (ref 8.4–10.5)
Creatinine, Ser: 0.9 mg/dL (ref 0.40–1.20)
GFR: 68.17 mL/min (ref >60.00)
Glucose, Bld: 123 mg/dL — ABNORMAL HIGH (ref 70–99)
POTASSIUM: 4.5 meq/L (ref 3.5–5.1)
Sodium: 142 mEq/L (ref 135–145)
TOTAL PROTEIN: 7.3 g/dL (ref 6.0–8.3)
Total Bilirubin: 0.3 mg/dL (ref 0.2–1.2)

## 2017-10-07 LAB — CBC
HEMATOCRIT: 41.1 % (ref 36.0–46.0)
HEMOGLOBIN: 13.8 g/dL (ref 12.0–15.0)
MCHC: 33.6 g/dL (ref 30.0–36.0)
MCV: 89.4 fl (ref 78.0–100.0)
Platelets: 270 10*3/uL (ref 150.0–400.0)
RBC: 4.6 Mil/uL (ref 3.87–5.11)
RDW: 13.2 % (ref 11.5–15.5)
WBC: 6.2 10*3/uL (ref 4.0–10.5)

## 2017-10-07 LAB — TROPONIN I: TNIDX: 0 ug/L (ref 0.00–0.06)

## 2017-10-07 LAB — TSH: TSH: 3.94 u[IU]/mL (ref 0.35–4.50)

## 2017-10-07 LAB — VITAMIN D 25 HYDROXY (VIT D DEFICIENCY, FRACTURES): VITD: 27 ng/mL — AB (ref 30.00–100.00)

## 2017-10-07 LAB — HEMOGLOBIN A1C: HEMOGLOBIN A1C: 6.2 % (ref 4.6–6.5)

## 2017-10-07 MED ORDER — FLUTICASONE PROPIONATE 50 MCG/ACT NA SUSP: 1.0000 | Freq: Every day | NASAL | 11 refills | Status: DC

## 2017-10-07 NOTE — Progress Notes (Addendum)
[place] at [facility] [place], [place], [place] [address] [phone]  Date:  10/07/2017   Name:  Jamie Levy   DOB:  Sep 27, 1960   MRN:  [id number]  PCP:  Darreld Mclean, MD    Chief Complaint: Heartburn (c/o feeling possbile heartburn. ) and Weight Gain (c/o weight gain. )   History of Present Illness:  Jamie Levy is a 57 y.o. very pleasant female patient who presents with the following:  Here today to discuss concern of acid reflux She notes that she has had acid reflux recently, and it seems to go into her left neck She did make an appt with cardiology- this is in the next month or so She is also seeing GI in March- Dr. Silverio Decamp- about her concerns of stomach bloating  She describes a "sensation, it doesn't hurt" that will go into her left neck. This tends to occur just after eating. She will feel a sensation in her stomach, up her throat and into her left neck It may last seconds to a couple of minutes She has not noted this happening with exertion at all  This has been going on for a month or so It may be happening more often now- it is really every time she eats now.    It does not matter what she eats- she will get the sx with any food it seems  Not using NSAIDs She did have a right upper quadrant pain yesterday- this was not after eating and never happened before or since   She takes a probiotic but nothing for GERD  History of IBS, hyperlipidemia I last saw her in August  She is concerned about weight gain  We did an [place] of her belly in 2012:  IMPRESSION: 1.  Possible tiny gallstone or sludge.  No evidence of acute cholecystitis or biliary ductal dilatation. 2.  Remote granulomatous disease within the liver.  Wt Readings from Last 3 Encounters:  10/07/17 142 lb 12.8 oz (64.8 kg)  05/08/17 137 lb 12.8 oz (62.5 kg)  03/20/17 137 lb 9.6 oz (62.4 kg)    Lab Results   Component Value Date   TSH 1.61 07/09/2016   She did eat a few crackers so far today  She is not having any CP or neck discomfort at this time    Patient Active Problem List   Diagnosis Date Noted  . Facial rash 05/20/2012  . Weight loss, abnormal 07/09/2011  . Skin cancer 02/16/2011  . CONSTIPATION 12/02/2009  . Flatulence, eructation, and gas pain 12/02/2009  . ABDOMINAL PAIN RIGHT LOWER QUADRANT 12/02/2009  . SLEEP APNEA 04/27/2009  . IRRITABLE BOWEL SYNDROME 03/28/2009  . RECTAL BLEEDING 03/15/2009  . HYPERLIPIDEMIA 03/11/2009  . DEPRESSION 03/11/2009  . SINUSITIS, CHRONIC 03/11/2009  . Diverticulosis of colon (without mention of hemorrhage) 03/11/2009  . Abdominal pain, left lower quadrant 03/11/2009    Past Medical History:  Diagnosis Date  . Abdominal pain, left lower quadrant   . Allergy   . Anxiety   . Depressive disorder, not elsewhere classified   . Diarrhea of presumed infectious origin   . Diverticulosis of colon (without mention of hemorrhage)   . Facial ringworm May '14  . Irritable bowel syndrome   . Other and unspecified hyperlipidemia   . Sleep apnea   . Unspecified sinusitis (chronic)   . Unspecified sleep apnea     Past Surgical History:  Procedure Laterality Date  . ABDOMINAL HYSTERECTOMY  1997   BSo- Fibroids  . COLONOSCOPY    . elbow cystectomy    . NASAL SINUS SURGERY     x 7 most recent June '14    Social History   Tobacco Use  . Smoking status: Never Smoker  . Smokeless tobacco: Never Used  Substance Use Topics  . Alcohol use: Yes    Alcohol/week: 1.8 oz    Types: 3 Standard drinks or equivalent per week    Comment: very rare, avoids alcohol when depressed  . Drug use: No    Family History  Problem Relation Age of Onset  . Ovarian cancer Mother   . Cancer Mother        ovarian  . Prostate cancer Father   . Colon polyps Father   . Heart disease Father        CAD/CABG/Stents  . Melanoma Father   . Depression Father         suicidal in the past  . Mental illness Father   . Cancer Maternal Grandmother        bladder  . Colon cancer Neg Hx   . Stomach cancer Neg Hx   . Esophageal cancer Neg Hx     Allergies  Allergen Reactions  . Compazine [Prochlorperazine Maleate] Anaphylaxis    Stroke like symptoms   . Iohexol Hives and Other (See Comments)     Code: HIVES, Desc: pt developed one hive on rt upper arm and itching on upper lip after receiving 155ml omni 300, needs to be pre medicated, Onset Date: [id number] 05-17-2014 Patient able to tolerate water soluble PO contrast w/o reaction. rsm MUST PRE-MEDICATE WITH BENADRYL  . Codeine Nausea And Vomiting  . Buprenorphine Hcl Nausea And Vomiting     give benadryl IV side effects lighten  . Morphine And Related Nausea And Vomiting  . Sulfamethoxazole-Trimethoprim Hives and Other (See Comments)    Makes her anxious & keeps her up all night    Medication list has been reviewed and updated.  Current Outpatient Medications on File Prior to Visit  Medication Sig Dispense Refill  . aspirin 81 MG tablet Take 81 mg by mouth daily.    Marland Kitchen buPROPion (WELLBUTRIN XL) 300 MG 24 hr tablet Take 300 mg by mouth daily.    . cetirizine (ZYRTEC) 10 MG tablet Take 10 mg by mouth daily.    . diazepam (VALIUM) 2 MG tablet take 1 to 2 tablets by mouth at bedtime if needed for insomnia  0  . erythromycin ophthalmic ointment Place 1 application into the left eye 3 (three) times daily. May use up to 6x a day as needed to lubricate eye 3.5 g 0  . fluticasone (FLONASE) 50 MCG/ACT nasal spray Place 1 spray into both nostrils daily. 16 g 11  . Probiotic Product (ALIGN) 4 MG CAPS Take 1 capsule by mouth daily.    . rosuvastatin (CRESTOR) 20 MG tablet Take 1 tablet (20 mg total) by mouth daily. 90 tablet 3  . traZODone (DESYREL) 50 MG tablet take 1 to 2 tablets by mouth once daily  0  . valACYclovir (VALTREX) 1000 MG tablet Take 2 pills, then repeat 2 pills 12 hours later.  Use as needed  for cold sore outbreak 40 tablet 1  . venlafaxine XR (EFFEXOR-XR) 75 MG 24 hr capsule Take 2 capsules by mouth daily.     No current facility-administered medications on file prior to visit.  Review of Systems:  As per HPI- otherwise negative. No fever or chills No cough or SOB  Physical Examination: Vitals:   10/07/17 1207  BP: 110/78  Pulse: 82  Temp: 98 F (36.7 C)  SpO2: 96%   Vitals:   10/07/17 1207  Weight: 142 lb 12.8 oz (64.8 kg)  Height: 5\' 2"  (1.575 m)   Body mass index is 26.12 kg/m. Ideal Body Weight: Weight in (lb) to have BMI = 25: 136.4  GEN: WDWN, NAD, Non-toxic, A & O x 3, mild overweight, looks well  HEENT: Atraumatic, Normocephalic. Neck supple. No masses, No LAD. Ears and Nose: No external deformity. CV: RRR, No M/G/R. No JVD. No thrill. No extra heart sounds. PULM: CTA B, no wheezes, crackles, rhonchi. No retractions. No resp. distress. No accessory muscle use. ABD: S, NT, ND, +BS. No rebound. No HSM. EXTR: No c/c/e NEURO Normal gait.  PSYCH: Normally interactive. Conversant. Not depressed or anxious appearing.  Calm demeanor.   Pt declines an EKG today but is ok with doing a troponin  Assessment and Plan: Gastroesophageal reflux disease, esophagitis presence not specified - Plan: H. pylori breath test  Weight gain - Plan: TSH  Right upper quadrant pain - Plan: US Abdomen Limited RUQ  Pre-diabetes - Plan: Comprehensive metabolic panel, Hemoglobin A1c  Low energy - Plan: CBC, Vitamin D (25 hydroxy)  Neck pain on left side - Plan: Troponin I  Non-seasonal allergic rhinitis due to pollen - Plan: fluticasone (FLONASE) 50 MCG/ACT nasal spray  Here today with concern of sx in her left neck- will check troponin to make sure no evidence of ischemia She is already set up with cardiology Will check H pylori and RUQ [place] for her as well She will seek care if any worsening or change of her sx   1/29-  Received her labs, message to pt  Your  blood count is normal Metabolic profile normal- glucose is ok for a non- fasting sample Your A1c shows pre-diabetes; we will monitor this every 6- 12 months to make sure you do not progress to diabetes Vitamin D is slightly low- please take an OTC vitamin D with 2,000 IU of D daily to help support your vitamin D level Thyroid is normal Troponin is negative as you know Results for orders placed or performed in visit on 10/07/17  CBC  Result Value Ref Range   WBC 6.2 4.0 - 10.5 K/uL   RBC 4.60 3.87 - 5.11 Mil/uL   Platelets 270.0 150.0 - 400.0 K/uL   Hemoglobin 13.8 12.0 - 15.0 g/dL   HCT 41.1 36.0 - 46.0 %   MCV 89.4 78.0 - 100.0 fl   MCHC 33.6 30.0 - 36.0 g/dL   RDW 13.2 11.5 - 15.5 %  Comprehensive metabolic panel  Result Value Ref Range   Sodium 142 135 - 145 mEq/L   Potassium 4.5 3.5 - 5.1 mEq/L   Chloride 103 96 - 112 mEq/L   CO2 31 19 - 32 mEq/L   Glucose, Bld 123 (H) 70 - 99 mg/dL   BUN 9 6 - 23 mg/dL   Creatinine, Ser 0.90 0.40 - 1.20 mg/dL   Total Bilirubin 0.3 0.2 - 1.2 mg/dL   Alkaline Phosphatase 54 39 - 117 U/L   AST 24 0 - 37 U/L   ALT 24 0 - 35 U/L   Total Protein 7.3 6.0 - 8.3 g/dL   Albumin 4.5 3.5 - 5.2 g/dL   Calcium 9.6 8.4 - 10.5 mg/dL  GFR 68.17 >60.00 mL/min  Hemoglobin A1c  Result Value Ref Range   Hgb A1c MFr Bld 6.2 4.6 - 6.5 %  TSH  Result Value Ref Range   TSH 3.94 0.35 - 4.50 uIU/mL  Vitamin D (25 hydroxy)  Result Value Ref Range   VITD 27.00 (L) 30.00 - 100.00 ng/mL  Troponin I  Result Value Ref Range   TNIDX 0.00 0.00 - 0.06 ug/l     Signed Lamar Blinks, MD

## 2017-10-07 NOTE — Patient Instructions (Signed)
It was good to see you today- we will get your blood work and H pylori breath test today I will also arrange for a gallbladder ultrasound.   Let me know if you have any change or worsening of your symptoms pending your cardiology/ GI appts

## 2017-10-08 ENCOUNTER — Encounter: Payer: Self-pay | Admitting: Family Medicine

## 2017-10-08 LAB — H. PYLORI BREATH TEST: H. PYLORI BREATH TEST: NOT DETECTED

## 2017-10-11 ENCOUNTER — Telehealth: Payer: Self-pay | Admitting: Family Medicine

## 2017-10-11 ENCOUNTER — Other Ambulatory Visit: Payer: Self-pay | Admitting: Emergency Medicine

## 2017-10-11 ENCOUNTER — Ambulatory Visit (HOSPITAL_BASED_OUTPATIENT_CLINIC_OR_DEPARTMENT_OTHER)
Admission: RE | Admit: 2017-10-11 | Discharge: 2017-10-11 | Disposition: A | Discharge status: Home / Self Care | Payer: 59 | Source: Ambulatory Visit | Attending: Family Medicine | Admitting: Family Medicine

## 2017-10-11 DIAGNOSIS — R1011 Right upper quadrant pain: Secondary | ICD-10-CM | POA: Insufficient documentation

## 2017-10-11 DIAGNOSIS — R932 Abnormal findings on diagnostic imaging of liver and biliary tract: Secondary | ICD-10-CM | POA: Diagnosis not present

## 2017-10-11 NOTE — Telephone Encounter (Signed)
Copied from [place]. Topic: Quick Communication - See Telephone Encounter >> Oct 11, 2017  8:08 AM Rosalin Hawking wrote: CRM for notification. See Telephone encounter for:  10/11/17.   Pt came in office wanting to inform provider wants to change her pharmacy on file from [facility] to [facility] on [address], [place]. Please advise.

## 2017-10-11 NOTE — Telephone Encounter (Signed)
Pharmacy has been changed from [facility] to [facility] on [address] per pt request.
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
## 2017-10-22 ENCOUNTER — Ambulatory Visit: Payer: 59 | Admitting: Physician Assistant

## 2017-10-22 ENCOUNTER — Telehealth: Payer: Self-pay | Admitting: *Deleted

## 2017-10-22 ENCOUNTER — Encounter: Payer: Self-pay | Admitting: Physician Assistant

## 2017-10-22 VITALS — BP 126/74 | HR 75 | Ht 62.0 in | Wt 143.2 lb

## 2017-10-22 DIAGNOSIS — R0789 Other chest pain: Secondary | ICD-10-CM | POA: Diagnosis not present

## 2017-10-22 DIAGNOSIS — G4733 Obstructive sleep apnea (adult) (pediatric): Secondary | ICD-10-CM | POA: Diagnosis not present

## 2017-10-22 DIAGNOSIS — R079 Chest pain, unspecified: Secondary | ICD-10-CM

## 2017-10-22 DIAGNOSIS — E782 Mixed hyperlipidemia: Secondary | ICD-10-CM | POA: Diagnosis not present

## 2017-10-22 NOTE — Telephone Encounter (Addendum)
LM FOR PT TO CALL BACK , NEEDING TO KNOW IF PT TOLERATED THE CT  Dye FROM 2015 OKAY OR IF NEEDED PRE MEDICATED FOR ALLERGY.  ALSO WILL NEED TO SEND IN SCRIPT FOR METOPROLOL 25 MG 1 TAB 1 HOUR BEFORE PROCEDURE IF PROCEEDS WITH CTA .Adonis Housekeeper

## 2017-10-22 NOTE — Telephone Encounter (Signed)
LM TO CALL BACK ./CY 

## 2017-10-22 NOTE — Patient Instructions (Signed)
Your physician has recommended you make the following change in your medication:  START OTC [place]  Your physician recommends that you schedule a follow-up appointment in:  AS NEEDED

## 2017-10-24 NOTE — Telephone Encounter (Signed)
LMTCB ./CY 

## 2017-11-04 NOTE — Telephone Encounter (Signed)
SEVERAL MESSAGES HAVE BEEN LEFT WILL AWAIT RETURN CALL FROM PT .Jamie Levy

## 2017-11-21 DIAGNOSIS — G4733 Obstructive sleep apnea (adult) (pediatric): Secondary | ICD-10-CM | POA: Diagnosis not present

## 2017-11-22 ENCOUNTER — Encounter: Payer: Self-pay | Admitting: Physician Assistant

## 2017-12-04 ENCOUNTER — Encounter: Payer: Self-pay | Admitting: Gastroenterology

## 2017-12-04 ENCOUNTER — Ambulatory Visit: Payer: BC Managed Care – PPO | Admitting: Gastroenterology

## 2017-12-04 VITALS — BP 114/80 | HR 80 | Ht 62.0 in | Wt 140.0 lb

## 2017-12-04 DIAGNOSIS — K5900 Constipation, unspecified: Secondary | ICD-10-CM

## 2017-12-04 DIAGNOSIS — R140 Abdominal distension (gaseous): Secondary | ICD-10-CM | POA: Diagnosis not present

## 2017-12-04 DIAGNOSIS — R109 Unspecified abdominal pain: Secondary | ICD-10-CM | POA: Diagnosis not present

## 2017-12-04 DIAGNOSIS — K589 Irritable bowel syndrome without diarrhea: Secondary | ICD-10-CM

## 2017-12-04 NOTE — Patient Instructions (Addendum)
If you are age 59 or older, your body mass index should be between 23-30. Your Body mass index is 25.61 kg/m. If this is out of the aforementioned range listed, please consider follow up with your Primary Care Provider.  If you are age 81 or younger, your body mass index should be between 19-25. Your Body mass index is 25.61 kg/m. If this is out of the aformentioned range listed, please consider follow up with your Primary Care Provider.   Please purchase the following medications over the counter and take as directed: IB Gard one capsule three times daily.  We have given you samples of Align please take once daily.  Please fill out the food diary we have given you and bring to your next appointment.   Thank you for choosing [facility]  Dr. Silverio Decamp
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
## 2017-12-31 ENCOUNTER — Telehealth: Payer: Self-pay | Admitting: Family Medicine

## 2017-12-31 MED ORDER — ROSUVASTATIN CALCIUM 20 MG PO TABS: 20.0000 mg | ORAL_TABLET | Freq: Every day | ORAL | 0 refills | Status: DC

## 2017-12-31 NOTE — Telephone Encounter (Signed)
Copied from [place] [id number]. Topic: Quick Communication - Rx Refill/Question >> Dec 31, 2017  8:30 AM Scherrie Gerlach wrote: Medication: rosuvastatin (CRESTOR) 20 MG tablet Has the patient contacted their pharmacy? Yes Pt has had [facility] due to [facility] insurance, and [facility] will not transfer the Rx because they [facility] to be [facility] and no longer have the refill on file. Can you send in new rx to:   [id number] [address], [place]. [phone] (Phone) [phone] (Fax)

## 2018-01-03 ENCOUNTER — Telehealth: Payer: Self-pay | Admitting: Gastroenterology

## 2018-01-03 ENCOUNTER — Other Ambulatory Visit: Payer: Self-pay

## 2018-01-03 DIAGNOSIS — R1013 Epigastric pain: Principal | ICD-10-CM

## 2018-01-03 DIAGNOSIS — K589 Irritable bowel syndrome without diarrhea: Secondary | ICD-10-CM

## 2018-01-03 DIAGNOSIS — G8929 Other chronic pain: Secondary | ICD-10-CM

## 2018-01-03 MED ORDER — CIPROFLOXACIN HCL 500 MG PO TABS: 500.0000 mg | ORAL_TABLET | Freq: Two times a day (BID) | ORAL | 0 refills | Status: DC

## 2018-01-03 MED ORDER — METRONIDAZOLE 500 MG PO TABS: 500.0000 mg | ORAL_TABLET | Freq: Three times a day (TID) | ORAL | 0 refills | Status: DC

## 2018-01-03 NOTE — Telephone Encounter (Signed)
Pt stated having strong stomach pain all day yesterday and was running a light fever. She wants to know if she can be seen today.

## 2018-01-03 NOTE — Telephone Encounter (Signed)
Please send Rx for ciprofloxacin 50omg BID and Flagyl 500mg  TID X 7 days. Maintain hydration, liquid diet for today. Call with worsening symptoms. Schedule follow up visit next week. Thanks

## 2018-01-03 NOTE — Telephone Encounter (Signed)
Spoke to the patient. She developed lower abdominal pain yesterday. Hurts to walk around. Bowel movements are soft unformed. Feels an urgent need to evacuate her bowels when she goes to the bathroom to empty her bladder. She says "even my butt hurts." Nauseated. Not vomiting. If she eats her pain increases. The fever last night was 99.8 orally and she was sweating. No openings on the schedule. Please advise.

## 2018-01-03 NOTE — Telephone Encounter (Signed)
Spoke with patient, advised hydration and liquid diet today.  Prescriptions sent to her pharmacy.  Appointment made with Amy for 01/13/18 (first available).

## 2018-01-03 NOTE — Telephone Encounter (Signed)
Patient states she needs to add she has a fever of 100. States she thinks she may be having diverticulitis with the fever and abdominal.

## 2018-01-05 ENCOUNTER — Telehealth: Payer: Self-pay | Admitting: Internal Medicine

## 2018-01-05 NOTE — Telephone Encounter (Signed)
Pt called the on-call MD just now. Recent contact with our office for fever, abd pain.  Empirically treated for diverticulitis by Dr. Silverio Decamp with ciprofloxacin and metronidazole, which started on 01/03/2018.  Started Thursday, now wondering, because today stomach distended and back pain, a little bit of lower abd pain, pain "very gradually" better.  Night sweats, drenched at night.  Tired. No BMs today, a very small "tiny" BM yesterday.  + Gas.  No bleeding.  Pain more stabbing with passing gas and before urination.  Trying to avoid turning into an "emergency situation".  Had diverticulosis years ago, but "not this bad".   Diet started recently "the next 56 days diet" and started Magna-Ca-D blend of Mg, Ca and Vit D.  Now fevers have gone with abx.    Plan: --if any worsening symptoms today/tonight (abd pain, nausea, vomiting, fever > 101, bleeding) she is instructed to go to the ER immediately.  She voices understanding --labs tomorrow (CBC, CMP, lipase, she is worried about pancreatitis b/c her son who is in pharmacy school mentioned that maybe this could also be going on.  I do not think so, but have included recommendation for lipase) --Input from Dr. Silverio Decamp tomorrow, if not better then likely CT abd/pelvis --Continue current doses of cipro/flagyl --Low residue diet with focus on fluids --Begin MiraLax 17 g daily to softening stools and promote BMs  She thanked me for the call   Beth Please check on her earlier Monday, 01/06/18 and place lab orders  Thank you JMP

## 2018-01-06 ENCOUNTER — Other Ambulatory Visit: Payer: Self-pay

## 2018-01-06 ENCOUNTER — Other Ambulatory Visit (INDEPENDENT_AMBULATORY_CARE_PROVIDER_SITE_OTHER): Payer: 59

## 2018-01-06 DIAGNOSIS — R1031 Right lower quadrant pain: Secondary | ICD-10-CM | POA: Diagnosis not present

## 2018-01-06 LAB — CBC WITH DIFFERENTIAL/PLATELET
BASOS ABS: 0.1 10*3/uL (ref 0.0–0.1)
Basophils Relative: 0.8 % (ref 0.0–3.0)
EOS ABS: 0.3 10*3/uL (ref 0.0–0.7)
Eosinophils Relative: 4.4 % (ref 0.0–5.0)
HCT: 37.6 % (ref 36.0–46.0)
Hemoglobin: 13.1 g/dL (ref 12.0–15.0)
LYMPHS ABS: 1.2 10*3/uL (ref 0.7–4.0)
Lymphocytes Relative: 16.1 % (ref 12.0–46.0)
MCHC: 34.8 g/dL (ref 30.0–36.0)
MCV: 86.1 fl (ref 78.0–100.0)
MONO ABS: 0.8 10*3/uL (ref 0.1–1.0)
MONOS PCT: 10.6 % (ref 3.0–12.0)
NEUTROS PCT: 68.1 % (ref 43.0–77.0)
Neutro Abs: 4.9 10*3/uL (ref 1.4–7.7)
Platelets: 274 10*3/uL (ref 150.0–400.0)
RBC: 4.36 Mil/uL (ref 3.87–5.11)
RDW: 13.2 % (ref 11.5–15.5)
WBC: 7.3 10*3/uL (ref 4.0–10.5)

## 2018-01-06 LAB — COMPREHENSIVE METABOLIC PANEL
ALK PHOS: 54 U/L (ref 39–117)
ALT: 11 U/L (ref 0–35)
AST: 12 U/L (ref 0–37)
Albumin: 4.1 g/dL (ref 3.5–5.2)
BILIRUBIN TOTAL: 0.4 mg/dL (ref 0.2–1.2)
BUN: 13 mg/dL (ref 6–23)
CO2: 27 mEq/L (ref 19–32)
Calcium: 9.2 mg/dL (ref 8.4–10.5)
Chloride: 99 mEq/L (ref 96–112)
Creatinine, Ser: 0.76 mg/dL (ref 0.40–1.20)
GFR: 82.79 mL/min (ref >60.00)
GLUCOSE: 101 mg/dL — AB (ref 70–99)
Potassium: 3.9 mEq/L (ref 3.5–5.1)
Sodium: 135 mEq/L (ref 135–145)
TOTAL PROTEIN: 7.3 g/dL (ref 6.0–8.3)

## 2018-01-06 LAB — LIPASE: Lipase: 24 U/L (ref 11.0–59.0)

## 2018-01-06 NOTE — Telephone Encounter (Signed)
And the metamucil is okay?

## 2018-01-06 NOTE — Telephone Encounter (Signed)
Patient is advised. She says she is good with this. She states she was just feeling really bad this morning. She is feeling better now. Continue low residue diet and encourage fluids. Keep appointment as scheduled for follow up.

## 2018-01-06 NOTE — Telephone Encounter (Signed)
Pt is returning your call [phone]

## 2018-01-06 NOTE — Telephone Encounter (Signed)
She is established with Dr. Silverio Decamp with treatment plan in place Unless, Dr. Silverio Decamp is opposed, I would suggest she remain with her

## 2018-01-06 NOTE — Telephone Encounter (Signed)
Spoke with this patient. She states she is the same with distended abdomen, "like there is a balloon in there." Feels physically and mentally tired. Continues to have low back pain. She started taking Metamucil and feels very "certain" this was the instruction she was given. Agrees to come in for labs. Asks to change her care to Dr Hilarie Fredrickson. Okay to switch providers?

## 2018-01-07 ENCOUNTER — Telehealth: Payer: Self-pay | Admitting: Gastroenterology

## 2018-01-07 ENCOUNTER — Encounter: Payer: Self-pay | Admitting: Family Medicine

## 2018-01-07 DIAGNOSIS — Z803 Family history of malignant neoplasm of breast: Secondary | ICD-10-CM | POA: Diagnosis not present

## 2018-01-07 DIAGNOSIS — Z1231 Encounter for screening mammogram for malignant neoplasm of breast: Secondary | ICD-10-CM | POA: Diagnosis not present

## 2018-01-07 NOTE — Telephone Encounter (Signed)
Voicemail. Left message to call back.

## 2018-01-07 NOTE — Telephone Encounter (Signed)
Patient states after hearing lab results yesterday she has a question she forgot to ask the nurse, and pt also wants to discuss concern she has from using the bathroom this morning.

## 2018-01-07 NOTE — Telephone Encounter (Signed)
ok 

## 2018-01-07 NOTE — Telephone Encounter (Signed)
Spoke with patient. She was concerned about her bowel movement appearing "fuzzy or fluffy." She has not been able to find information on Google. Feeling well. Afebrile. No blood with bowel movement. Low residue diet. She will continue with present treatment. Keep her follow up appointment as scheduled. Call with any new or worsening symptoms.

## 2018-01-13 ENCOUNTER — Encounter: Payer: Self-pay | Admitting: Physician Assistant

## 2018-01-13 ENCOUNTER — Ambulatory Visit (INDEPENDENT_AMBULATORY_CARE_PROVIDER_SITE_OTHER): Payer: 59 | Admitting: Physician Assistant

## 2018-01-13 VITALS — BP 120/76 | HR 72 | Ht 62.0 in | Wt 132.2 lb

## 2018-01-13 DIAGNOSIS — R109 Unspecified abdominal pain: Secondary | ICD-10-CM

## 2018-01-13 DIAGNOSIS — R1084 Generalized abdominal pain: Secondary | ICD-10-CM

## 2018-01-13 DIAGNOSIS — K5732 Diverticulitis of large intestine without perforation or abscess without bleeding: Secondary | ICD-10-CM

## 2018-01-13 NOTE — Progress Notes (Signed)
Reviewed and agree with documentation and assessment and plan. K. Veena Kalyn Hofstra , MD   
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
## 2018-01-13 NOTE — Patient Instructions (Signed)
Advance your diet.  Drink plenty of water. Avoid nuts and popcorn.    You have been scheduled for a CT scan of the abdomen and pelvis at [facility] ([address]---this is in the same building as Press [facility]).   You are scheduled on Thursday 01-16-2018 at 8:45 am.. You should arrive at 8:30 am  to your appointment time for registration. Please follow the written instructions below on the day of your exam:  WARNING: IF YOU ARE ALLERGIC TO IODINE/X-RAY DYE, PLEASE NOTIFY RADIOLOGY IMMEDIATELY AT [phone]! YOU WILL BE GIVEN A 13 HOUR PREMEDICATION PREP.  1) Do not eat  anything after 4:45 AM (4 hours prior to your test) 2) You have been given 2 bottles of oral contrast to drink. The solution may taste better if refrigerated, but do NOT add ice or any other liquid to this solution. Shake well before drinking.    Drink 1 bottle of contrast @ 6;45 am (2 hours prior to your exam)  Drink 1 bottle of contrast @ 7:45 am (1 hour prior to your exam)  You may take any medications as prescribed with a small amount of water except for the following: Metformin, Glucophage, Glucovance, Avandamet, Riomet, Fortamet, Actoplus Met, Janumet, Glumetza or Metaglip. The above medications must be held the day of the exam AND 48 hours after the exam.  The purpose of you drinking the oral contrast is to aid in the visualization of your intestinal tract. The contrast solution may cause some diarrhea. Before your exam is started, you will be given a small amount of fluid to drink. Depending on your individual set of symptoms, you may also receive an intravenous injection of x-ray contrast/dye. Plan on being at [facility] for 30 minutes or long, depending on the type of exam you are having performed.  If you have any questions regarding your exam or if you need to reschedule, you may call the CT department at [phone] between the hours of 8:00 am and 5:00 pm,  Monday-Friday.  ________________________________________________________________________

## 2018-01-16 ENCOUNTER — Ambulatory Visit (INDEPENDENT_AMBULATORY_CARE_PROVIDER_SITE_OTHER)
Admission: RE | Admit: 2018-01-16 | Discharge: 2018-01-16 | Disposition: A | Discharge status: Home / Self Care | Payer: 59 | Source: Ambulatory Visit | Attending: Physician Assistant | Admitting: Physician Assistant

## 2018-01-16 DIAGNOSIS — K5732 Diverticulitis of large intestine without perforation or abscess without bleeding: Secondary | ICD-10-CM

## 2018-01-16 DIAGNOSIS — R109 Unspecified abdominal pain: Secondary | ICD-10-CM | POA: Diagnosis not present

## 2018-01-16 DIAGNOSIS — K5793 Diverticulitis of intestine, part unspecified, without perforation or abscess with bleeding: Secondary | ICD-10-CM | POA: Diagnosis not present

## 2018-01-17 ENCOUNTER — Other Ambulatory Visit: Payer: Self-pay

## 2018-01-17 ENCOUNTER — Telehealth: Payer: Self-pay | Admitting: Physician Assistant

## 2018-01-17 MED ORDER — GLYCOPYRROLATE 2 MG PO TABS: 2.0000 mg | ORAL_TABLET | Freq: Every day | ORAL | 0 refills | Status: DC | PRN

## 2018-01-17 NOTE — Telephone Encounter (Signed)
Discussed with the patient. She agrees to try the Robinul and let us know if it is not helping.  Rx to [facility] on [address].

## 2018-01-17 NOTE — Telephone Encounter (Signed)
Please let pt know the Ct scan done yesterday does not show any diverticulitis  Which is good - lets try some glycopyrrolate (robinul forte) 2 mg po qam -see if helps - she is pron=bably having some spasm of bowel after recent episode of diverticulitis  Let her know abx typically will increase stool frequency   Have her call back early next week if Robinul nor helping

## 2018-01-17 NOTE — Telephone Encounter (Signed)
Spoke with the patient. She reports a return of her lower abdominal pain and pressure. Finished antibiotics yesterday. She also has not had a bowel movement since yesterday "and don't know why. I had a bowel movement every day when on the antibiotics." States she "getting nervous."

## 2018-01-23 DIAGNOSIS — G4733 Obstructive sleep apnea (adult) (pediatric): Secondary | ICD-10-CM | POA: Diagnosis not present

## 2018-01-28 NOTE — Telephone Encounter (Signed)
Patient states her symptoms are back again states she is feeling rectal pressure and pain in stomach. Pt is asking what she needs to do at this point or what foods she can eat. Best call back [phone].

## 2018-01-28 NOTE — Telephone Encounter (Signed)
Complains of abdominal pain. Afraid to eat. Drinking mostly water. She will try eating yogurt. She will avoid fiber. Appointment scheduled per her request.

## 2018-01-28 NOTE — Telephone Encounter (Signed)
Patient calling back wanting to know if she can speak with nurse and possibly schedule an appt tomorrow.

## 2018-01-29 ENCOUNTER — Ambulatory Visit: Payer: 59 | Admitting: Physician Assistant

## 2018-01-29 ENCOUNTER — Ambulatory Visit (INDEPENDENT_AMBULATORY_CARE_PROVIDER_SITE_OTHER)
Admission: RE | Admit: 2018-01-29 | Discharge: 2018-01-29 | Disposition: A | Discharge status: Home / Self Care | Payer: 59 | Source: Ambulatory Visit | Attending: Physician Assistant | Admitting: Physician Assistant

## 2018-01-29 ENCOUNTER — Encounter: Payer: Self-pay | Admitting: Physician Assistant

## 2018-01-29 VITALS — BP 136/68 | HR 74 | Ht 62.0 in | Wt 131.1 lb

## 2018-01-29 DIAGNOSIS — K589 Irritable bowel syndrome without diarrhea: Secondary | ICD-10-CM | POA: Diagnosis not present

## 2018-01-29 DIAGNOSIS — R140 Abdominal distension (gaseous): Secondary | ICD-10-CM

## 2018-01-29 DIAGNOSIS — G8929 Other chronic pain: Secondary | ICD-10-CM

## 2018-01-29 DIAGNOSIS — R1031 Right lower quadrant pain: Secondary | ICD-10-CM

## 2018-01-29 DIAGNOSIS — R1032 Left lower quadrant pain: Secondary | ICD-10-CM

## 2018-01-29 DIAGNOSIS — K5900 Constipation, unspecified: Secondary | ICD-10-CM | POA: Diagnosis not present

## 2018-01-29 MED ORDER — GLYCOPYRROLATE 2 MG PO TABS: ORAL_TABLET | ORAL | 6 refills | Status: DC

## 2018-01-29 NOTE — Patient Instructions (Addendum)
If you are age 59 or younger, your body mass index should be between 19-25. Your Body mass index is 23.98 kg/m. If this is out of the aformentioned range listed, please consider follow up with your Primary Care Provider.   We have sent the following medications to your pharmacy for you to pick up at your convenience: [place].  1. Glycopyrolate 2 mg.  We have provided you with a low gas diet.  We have given you samples of Linzess 145 mcg- Take 1 tab by mouth every morning. If helpful we can send a prescription for it.   .Your provider has requested that you have an abdominal x ray before leaving today. Please go to the basement floor to our Radiology department for the test..

## 2018-01-30 ENCOUNTER — Encounter: Payer: Self-pay | Admitting: Physician Assistant
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
## 2018-02-04 NOTE — Progress Notes (Signed)
Reviewed and agree with documentation and assessment and plan. K. Veena Lu Paradise , MD   

## 2018-02-05 DIAGNOSIS — D3132 Benign neoplasm of left choroid: Secondary | ICD-10-CM | POA: Diagnosis not present

## 2018-02-05 DIAGNOSIS — H43813 Vitreous degeneration, bilateral: Secondary | ICD-10-CM | POA: Diagnosis not present

## 2018-02-05 DIAGNOSIS — D3131 Benign neoplasm of right choroid: Secondary | ICD-10-CM | POA: Diagnosis not present

## 2018-02-05 DIAGNOSIS — H35372 Puckering of macula, left eye: Secondary | ICD-10-CM | POA: Diagnosis not present

## 2018-02-20 ENCOUNTER — Encounter: Payer: Self-pay | Admitting: Family Medicine

## 2018-02-20 DIAGNOSIS — D3131 Benign neoplasm of right choroid: Secondary | ICD-10-CM | POA: Insufficient documentation

## 2018-03-24 ENCOUNTER — Other Ambulatory Visit: Payer: Self-pay | Admitting: Family Medicine

## 2018-05-06 NOTE — Progress Notes (Addendum)
[place] at [facility] [address] [place], [place] [address] [phone]  Date:  05/08/2018   Name:  Jamie Levy   DOB:  [birth year]   MRN:  [id number]  PCP:  Darreld Mclean, MD    Chief Complaint: Medication Refill (crestor refill, fasting labs) and Fatigue (feeling tired, itching eyes, allergie related? )   History of Present Illness:  Jamie Levy is a 57 y.o. very pleasant female patient who presents with the following:  Following up on medication today- I last saw her in January  Would like to check cholesterol today History of IBS, OSA, hyperlipidemia, depression She had a relaxing summer overall despite one episode of diverticulitis- she handled this at home, "in hindsight I should have gone to the hospital" as she had fever and significant pain. Now recovered however She would like to check lipids and A1c today She is feeling tired, but this is not a new thing She is fasting this am  She is not using her CPAP as "I just can't handle it.'  She would like to maybe see a dentist about an oral appliance   Lab Results  Component Value Date   HGBA1C 6.2 10/07/2017    Pap- s/p total hysterectomy- done in 1996 due to concerns about family history of cancer.  She never had any cancer herself.  She does see GYN however- Dr. Alfred Levins   She is not exercising much, but she did get a new dog and plans to do more walking as the weather cools   Pt declines a flu shot today  wellbutrin crestor Trazodone effexor xr  robinul Patient Active Problem List   Diagnosis Date Noted  . 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 Facial rash 05/20/2012  . Weight loss, abnormal 07/09/2011  . Skin cancer 02/16/2011  . CONSTIPATION 12/02/2009  . Flatulence, eructation, and gas pain 12/02/2009  . ABDOMINAL PAIN RIGHT LOWER QUADRANT 12/02/2009  . SLEEP APNEA 04/27/2009  . IRRITABLE BOWEL SYNDROME 03/28/2009   . RECTAL BLEEDING 03/15/2009  . Dyslipidemia 03/11/2009  . DEPRESSION 03/11/2009  . SINUSITIS, CHRONIC 03/11/2009  . Diverticulosis of colon (without mention of hemorrhage) 03/11/2009  . Abdominal pain, left lower quadrant 03/11/2009    Past Medical History:  Diagnosis Date  . Abdominal pain, left lower quadrant   . Allergy   . Anxiety   . Depressive disorder, not elsewhere classified   . Diarrhea of presumed infectious origin   . Diverticulosis of colon (without mention of hemorrhage)   . Facial ringworm May '14  . Irritable bowel syndrome   . Other and unspecified hyperlipidemia   . Sleep apnea   . Unspecified sinusitis (chronic)   . Unspecified sleep apnea     Past Surgical History:  Procedure Laterality Date  . ABDOMINAL HYSTERECTOMY  1997   BSo- Fibroids  . COLONOSCOPY    . elbow cystectomy    . NASAL SINUS SURGERY     x 7 most recent June '14    Social History   Tobacco Use  . Smoking status: Never Smoker  . Smokeless tobacco: Never Used  Substance Use Topics  . Alcohol use: Yes    Alcohol/week: 3.0 standard drinks    Types: 3 Standard drinks or equivalent per week    Comment: very rare, avoids alcohol when depressed  . Drug use: No    Family History  Problem Relation Age of Onset  .  Ovarian cancer Mother   . Cancer Mother        ovarian  . Prostate cancer Father   . Colon polyps Father   . Heart disease Father        CAD/CABG/Stents  . Melanoma Father   . Depression Father        suicidal in the past  . Mental illness Father   . Cancer Maternal Grandmother        bladder  . Colon cancer Neg Hx   . Stomach cancer Neg Hx   . Esophageal cancer Neg Hx     Allergies  Allergen Reactions  . Compazine [Prochlorperazine Maleate] Anaphylaxis    Stroke like symptoms   . Iodinated Diagnostic Agents Hives  . Iohexol Hives and Other (See Comments)     Code: HIVES, Desc: pt developed one hive on rt upper arm and itching on upper lip after receiving  138ml omni 300, needs to be pre medicated, Onset Date: [id number] 05-17-2014 Patient able to tolerate water soluble PO contrast w/o reaction. rsm MUST PRE-MEDICATE WITH BENADRYL  . Codeine Nausea And Vomiting  . Buprenorphine Hcl Nausea And Vomiting     give benadryl IV side effects lighten  . Morphine And Related Nausea And Vomiting  . Sulfamethoxazole-Trimethoprim Hives and Other (See Comments)    Makes her anxious & keeps her up all night    Medication list has been reviewed and updated.  Current Outpatient Medications on File Prior to Visit  Medication Sig Dispense Refill  . aspirin 81 MG tablet Take 81 mg by mouth daily.    Marland Kitchen buPROPion (WELLBUTRIN XL) 300 MG 24 hr tablet Take 300 mg by mouth daily.    . cetirizine (ZYRTEC) 10 MG tablet Take 10 mg by mouth daily.    . fluticasone (FLONASE) 50 MCG/ACT nasal spray Place 1 spray into both nostrils daily. 16 g 11  . glycopyrrolate (ROBINUL) 2 MG tablet Take 1 tab twice daily as needed for spasms. 60 tablet 6  . Probiotic Product (ALIGN) 4 MG CAPS Take 1 capsule by mouth daily.    . rosuvastatin (CRESTOR) 20 MG tablet TAKE 1 TABLET BY MOUTH EVERY DAY**PT NEED OFFICE VISIT FOR MORE REFILLS PER DOCTOR** 30 tablet 0  . traZODone (DESYREL) 50 MG tablet take 1 to 2 tablets by mouth once daily  0  . valACYclovir (VALTREX) 1000 MG tablet Take 2 pills, then repeat 2 pills 12 hours later.  Use as needed for cold sore outbreak 40 tablet 1  . venlafaxine XR (EFFEXOR-XR) 75 MG 24 hr capsule Take 2 capsules by mouth daily.     No current facility-administered medications on file prior to visit.     Review of Systems:  As per HPI- otherwise negative. No fever or chills No current abd pain No CP or SOB   Physical Examination: Vitals:   05/08/18 0858  BP: 104/80  Pulse: 88  Resp: 16  SpO2: 98%   Vitals:   05/08/18 0858  Weight: 135 lb (61.2 kg)  Height: 5\' 2"  (1.575 m)   Body mass index is 24.69 kg/m. Ideal Body Weight: Weight in (lb)  to have BMI = 25: 136.4  GEN: WDWN, NAD, Non-toxic, A & O x 3, normal weight, looks well  HEENT: Atraumatic, Normocephalic. Neck supple. No masses, No LAD.  Bilateral TM wnl, oropharynx normal.  PEERL,EOMI.   Ears and Nose: No external deformity. CV: RRR, No M/G/R. No JVD. No thrill. No extra heart sounds. PULM: CTA  B, no wheezes, crackles, rhonchi. No retractions. No resp. distress. No accessory muscle use. ABD: S, NT, ND. EXTR: No c/c/e NEURO Normal gait.  PSYCH: Normally interactive. Conversant. Not depressed or anxious appearing.  Calm demeanor.    Assessment and Plan: Dyslipidemia - Plan: Lipid panel, rosuvastatin (CRESTOR) 20 MG tablet  Pre-diabetes - Plan: Hemoglobin A1c  Following up today A1c and Lipids pending Discussed diverticulitis and she will seek care the next time she thinks she is having a bout  Declined flu shot today  Encouraged more exercise   Signed Lamar Blinks, MD  Received her labs, message to pt- ?was she out of crestor for a while   Results for orders placed or performed in visit on 05/08/18  Lipid panel  Result Value Ref Range   Cholesterol 276 (H) 0 - 200 mg/dL   Triglycerides 97.0 0.0 - 149.0 mg/dL   HDL 76.60 >39.00 mg/dL   VLDL 19.4 0.0 - 40.0 mg/dL   LDL Cholesterol 180 (H) 0 - 99 mg/dL   Total CHOL/HDL Ratio 4    NonHDL 199.27   Hemoglobin A1c  Result Value Ref Range   Hgb A1c MFr Bld 6.0 4.6 - 6.5 %

## 2018-05-08 ENCOUNTER — Ambulatory Visit: Payer: 59 | Admitting: Family Medicine

## 2018-05-08 ENCOUNTER — Encounter: Payer: Self-pay | Admitting: Family Medicine

## 2018-05-08 VITALS — BP 104/80 | HR 88 | Resp 16 | Ht 62.0 in | Wt 135.0 lb

## 2018-05-08 DIAGNOSIS — R7303 Prediabetes: Secondary | ICD-10-CM | POA: Diagnosis not present

## 2018-05-08 DIAGNOSIS — Z9189 Other specified personal risk factors, not elsewhere classified: Secondary | ICD-10-CM | POA: Diagnosis not present

## 2018-05-08 DIAGNOSIS — E785 Hyperlipidemia, unspecified: Secondary | ICD-10-CM | POA: Diagnosis not present

## 2018-05-08 LAB — LIPID PANEL
Cholesterol: 276 mg/dL — ABNORMAL HIGH (ref 0–200)
HDL: 76.6 mg/dL (ref >39.00)
LDL Cholesterol: 180 mg/dL — ABNORMAL HIGH (ref 0–99)
NONHDL: 199.27
TRIGLYCERIDES: 97 mg/dL (ref 0.0–149.0)
Total CHOL/HDL Ratio: 4
VLDL: 19.4 mg/dL (ref 0.0–40.0)

## 2018-05-08 LAB — HEMOGLOBIN A1C: Hgb A1c MFr Bld: 6 % (ref 4.6–6.5)

## 2018-05-08 MED ORDER — ROSUVASTATIN CALCIUM 20 MG PO TABS: ORAL_TABLET | ORAL | 3 refills | Status: DC

## 2018-05-08 NOTE — Patient Instructions (Signed)
It was good to see you today- take care and I will be in touch with your labs asap Your BP looks good Continue to stay active and try to exercise more as the weather cold  Do consider getting the Shingrix vaccine series at your convenience

## 2018-05-09 ENCOUNTER — Encounter: Payer: Self-pay | Admitting: Family Medicine

## 2018-05-19 DIAGNOSIS — Z8052 Family history of malignant neoplasm of bladder: Secondary | ICD-10-CM | POA: Diagnosis not present

## 2018-05-19 DIAGNOSIS — Z8041 Family history of malignant neoplasm of ovary: Secondary | ICD-10-CM | POA: Diagnosis not present

## 2018-05-19 DIAGNOSIS — Z8042 Family history of malignant neoplasm of prostate: Secondary | ICD-10-CM | POA: Diagnosis not present

## 2018-05-19 DIAGNOSIS — R87615 Unsatisfactory cytologic smear of cervix: Secondary | ICD-10-CM | POA: Diagnosis not present

## 2018-05-19 DIAGNOSIS — Z6824 Body mass index (BMI) 24.0-24.9, adult: Secondary | ICD-10-CM | POA: Diagnosis not present

## 2018-05-19 DIAGNOSIS — Z01419 Encounter for gynecological examination (general) (routine) without abnormal findings: Secondary | ICD-10-CM | POA: Diagnosis not present

## 2018-06-10 NOTE — Progress Notes (Deleted)
[place] at [facility] [address] [place], [place] [address] [phone]  Date:  06/12/2018   Name:  Jamie Levy   DOB:  10/23/1960   MRN:  [id number]  PCP:  Darreld Mclean, MD    Chief Complaint: No chief complaint on file.   History of Present Illness:  Jamie Levy is a 57 y.o. very pleasant female patient who presents with the following:  Concern of a chest injury today History of hyperlipidemia, depression/ anxiety  Flu: Patient Active Problem List   Diagnosis Date Noted  . 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 Facial rash 05/20/2012  . Weight loss, abnormal 07/09/2011  . Skin cancer 02/16/2011  . CONSTIPATION 12/02/2009  . Flatulence, eructation, and gas pain 12/02/2009  . ABDOMINAL PAIN RIGHT LOWER QUADRANT 12/02/2009  . SLEEP APNEA 04/27/2009  . IRRITABLE BOWEL SYNDROME 03/28/2009  . RECTAL BLEEDING 03/15/2009  . Dyslipidemia 03/11/2009  . DEPRESSION 03/11/2009  . SINUSITIS, CHRONIC 03/11/2009  . Diverticulosis of colon (without mention of hemorrhage) 03/11/2009  . Abdominal pain, left lower quadrant 03/11/2009    Past Medical History:  Diagnosis Date  . Abdominal pain, left lower quadrant   . Allergy   . Anxiety   . Depressive disorder, not elsewhere classified   . Diarrhea of presumed infectious origin   . Diverticulosis of colon (without mention of hemorrhage)   . Facial ringworm May '14  . Irritable bowel syndrome   . Other and unspecified hyperlipidemia   . Sleep apnea   . Unspecified sinusitis (chronic)   . Unspecified sleep apnea     Past Surgical History:  Procedure Laterality Date  . ABDOMINAL HYSTERECTOMY  1997   BSo- Fibroids  . COLONOSCOPY    . elbow cystectomy    . NASAL SINUS SURGERY     x 7 most recent June '14    Social History   Tobacco Use  . Smoking status: Never Smoker  . Smokeless tobacco: Never Used  Substance Use Topics  .  Alcohol use: Yes    Alcohol/week: 3.0 standard drinks    Types: 3 Standard drinks or equivalent per week    Comment: very rare, avoids alcohol when depressed  . Drug use: No    Family History  Problem Relation Age of Onset  . Ovarian cancer Mother   . Cancer Mother        ovarian  . Prostate cancer Father   . Colon polyps Father   . Heart disease Father        CAD/CABG/Stents  . Melanoma Father   . Depression Father        suicidal in the past  . Mental illness Father   . Cancer Maternal Grandmother        bladder  . Colon cancer Neg Hx   . Stomach cancer Neg Hx   . Esophageal cancer Neg Hx     Allergies  Allergen Reactions  . Compazine [Prochlorperazine Maleate] Anaphylaxis    Stroke like symptoms   . Iodinated Diagnostic Agents Hives  . Iohexol Hives and Other (See Comments)     Code: HIVES, Desc: pt developed one hive on rt upper arm and itching on upper lip after receiving 172ml omni 300, needs to be pre medicated, Onset Date: [id number] 05-17-2014 Patient able to tolerate water soluble PO contrast w/o reaction. rsm MUST PRE-MEDICATE WITH BENADRYL  . Codeine Nausea And Vomiting  .  Buprenorphine Hcl Nausea And Vomiting     give benadryl IV side effects lighten  . Morphine And Related Nausea And Vomiting  . Sulfamethoxazole-Trimethoprim Hives and Other (See Comments)    Makes her anxious & keeps her up all night    Medication list has been reviewed and updated.  Current Outpatient Medications on File Prior to Visit  Medication Sig Dispense Refill  . aspirin 81 MG tablet Take 81 mg by mouth daily.    Marland Kitchen buPROPion (WELLBUTRIN XL) 300 MG 24 hr tablet Take 300 mg by mouth daily.    . cetirizine (ZYRTEC) 10 MG tablet Take 10 mg by mouth daily.    . fluticasone (FLONASE) 50 MCG/ACT nasal spray Place 1 spray into both nostrils daily. 16 g 11  . glycopyrrolate (ROBINUL) 2 MG tablet Take 1 tab twice daily as needed for spasms. 60 tablet 6  . Probiotic Product (ALIGN) 4 MG  CAPS Take 1 capsule by mouth daily.    . rosuvastatin (CRESTOR) 20 MG tablet TAKE 1 TABLET BY MOUTH EVERY DAY 90 tablet 3  . traZODone (DESYREL) 50 MG tablet take 1 to 2 tablets by mouth once daily  0  . valACYclovir (VALTREX) 1000 MG tablet Take 2 pills, then repeat 2 pills 12 hours later.  Use as needed for cold sore outbreak 40 tablet 1  . venlafaxine XR (EFFEXOR-XR) 75 MG 24 hr capsule Take 2 capsules by mouth daily.     No current facility-administered medications on file prior to visit.     Review of Systems:  As per HPI- otherwise negative.   Physical Examination: There were no vitals filed for this visit. There were no vitals filed for this visit. There is no height or weight on file to calculate BMI. Ideal Body Weight:    GEN: WDWN, NAD, Non-toxic, A & O x 3 HEENT: Atraumatic, Normocephalic. Neck supple. No masses, No LAD. Ears and Nose: No external deformity. CV: RRR, No M/G/R. No JVD. No thrill. No extra heart sounds. PULM: CTA B, no wheezes, crackles, rhonchi. No retractions. No resp. distress. No accessory muscle use. ABD: S, NT, ND, +BS. No rebound. No HSM. EXTR: No c/c/e NEURO Normal gait.  PSYCH: Normally interactive. Conversant. Not depressed or anxious appearing.  Calm demeanor.    Assessment and Plan: ***  Signed Lamar Blinks, MD

## 2018-06-12 ENCOUNTER — Ambulatory Visit: Payer: 59 | Admitting: Family Medicine

## 2018-06-27 DIAGNOSIS — H43821 Vitreomacular adhesion, right eye: Secondary | ICD-10-CM | POA: Diagnosis not present

## 2018-06-27 DIAGNOSIS — D3132 Benign neoplasm of left choroid: Secondary | ICD-10-CM | POA: Diagnosis not present

## 2018-06-27 DIAGNOSIS — D3131 Benign neoplasm of right choroid: Secondary | ICD-10-CM | POA: Diagnosis not present

## 2018-07-31 ENCOUNTER — Ambulatory Visit: Payer: 59 | Admitting: Family Medicine

## 2018-07-31 ENCOUNTER — Encounter: Payer: Self-pay | Admitting: Family Medicine

## 2018-07-31 VITALS — BP 122/80 | HR 95 | Resp 16 | Ht 62.0 in | Wt 141.0 lb

## 2018-07-31 DIAGNOSIS — E785 Hyperlipidemia, unspecified: Secondary | ICD-10-CM | POA: Diagnosis not present

## 2018-07-31 DIAGNOSIS — R7303 Prediabetes: Secondary | ICD-10-CM

## 2018-07-31 DIAGNOSIS — L570 Actinic keratosis: Secondary | ICD-10-CM

## 2018-07-31 DIAGNOSIS — G4733 Obstructive sleep apnea (adult) (pediatric): Secondary | ICD-10-CM | POA: Diagnosis not present

## 2018-07-31 NOTE — Progress Notes (Signed)
[place] at [facility] [address] [place], [place] [address] [phone]  Date:  07/31/2018   Name:  Jamie Levy   DOB:  28-Jun-1961   MRN:  [id number]  PCP:  Darreld Mclean, MD    Chief Complaint: Lab Work (discuss lab results); Skin Lesion (look at hands, skin spots, raised, referral to dermatologist); and Sleep Apnea (would like mouth piece, can not handle mask)   History of Present Illness:  Jamie Levy is a 57 y.o. very pleasant female patient who presents with the following:  Last seen by myself in August of this year- we had checked some labs but she did not end up reading the mychart messages I had sent her so we went over the results today She is taking her crestor on a regular basis She is frustrated by her a1c still being in the pre-diabetes range.  We discussed this and I encouraged her   She is following up today for concern of some skin lesions on her arms She has noticed these on and off for months However a couple of days ago she felt like there were a lot more They are moslty just on her arms and hands They are not itchy,"they just look horrible" She recalls a derm telling her that she had skin cancer on her finger years ago which was removed  Her dog recently had a dx of MRSA and she has been giving him special baths   She was recently seen by her psychiatrist Dr.Nichols  She has OSA but has been unable to tolerate a CPAP machine  She would like to see pulmonology to discuss an oral appliance perhaps   Declines flu shot today as she does not tolerate this well   Patient Active Problem List   Diagnosis Date Noted  . 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 Facial rash 05/20/2012  . Weight loss, abnormal 07/09/2011  . Skin cancer 02/16/2011  . CONSTIPATION 12/02/2009  . Flatulence, eructation, and gas pain 12/02/2009  . ABDOMINAL PAIN RIGHT LOWER QUADRANT 12/02/2009  . SLEEP  APNEA 04/27/2009  . IRRITABLE BOWEL SYNDROME 03/28/2009  . RECTAL BLEEDING 03/15/2009  . Dyslipidemia 03/11/2009  . DEPRESSION 03/11/2009  . SINUSITIS, CHRONIC 03/11/2009  . Diverticulosis of colon (without mention of hemorrhage) 03/11/2009  . Abdominal pain, left lower quadrant 03/11/2009    Past Medical History:  Diagnosis Date  . Abdominal pain, left lower quadrant   . Allergy   . Anxiety   . Depressive disorder, not elsewhere classified   . Diarrhea of presumed infectious origin   . Diverticulosis of colon (without mention of hemorrhage)   . Facial ringworm May '14  . Irritable bowel syndrome   . Other and unspecified hyperlipidemia   . Sleep apnea   . Unspecified sinusitis (chronic)   . Unspecified sleep apnea     Past Surgical History:  Procedure Laterality Date  . ABDOMINAL HYSTERECTOMY  1997   BSo- Fibroids  . COLONOSCOPY    . elbow cystectomy    . NASAL SINUS SURGERY     x 7 most recent June '14    Social History   Tobacco Use  . Smoking status: Never Smoker  . Smokeless tobacco: Never Used  Substance Use Topics  . Alcohol use: Yes    Alcohol/week: 3.0 standard drinks    Types: 3 Standard drinks or equivalent per week    Comment: very rare,  avoids alcohol when depressed  . Drug use: No    Family History  Problem Relation Age of Onset  . Ovarian cancer Mother   . Cancer Mother        ovarian  . Prostate cancer Father   . Colon polyps Father   . Heart disease Father        CAD/CABG/Stents  . Melanoma Father   . Depression Father        suicidal in the past  . Mental illness Father   . Cancer Maternal Grandmother        bladder  . Colon cancer Neg Hx   . Stomach cancer Neg Hx   . Esophageal cancer Neg Hx     Allergies  Allergen Reactions  . Compazine [Prochlorperazine Maleate] Anaphylaxis    Stroke like symptoms   . Iodinated Diagnostic Agents Hives  . Iohexol Hives and Other (See Comments)     Code: HIVES, Desc: pt developed one hive  on rt upper arm and itching on upper lip after receiving 115ml omni 300, needs to be pre medicated, Onset Date: [id number] 05-17-2014 Patient able to tolerate water soluble PO contrast w/o reaction. rsm MUST PRE-MEDICATE WITH BENADRYL  . Codeine Nausea And Vomiting  . Buprenorphine Hcl Nausea And Vomiting     give benadryl IV side effects lighten  . Morphine And Related Nausea And Vomiting  . Sulfamethoxazole-Trimethoprim Hives and Other (See Comments)    Makes her anxious & keeps her up all night    Medication list has been reviewed and updated.  Current Outpatient Medications on File Prior to Visit  Medication Sig Dispense Refill  . aspirin 81 MG tablet Take 81 mg by mouth daily.    Marland Kitchen buPROPion (WELLBUTRIN XL) 300 MG 24 hr tablet Take 300 mg by mouth daily.    . cetirizine (ZYRTEC) 10 MG tablet Take 10 mg by mouth daily.    . fluticasone (FLONASE) 50 MCG/ACT nasal spray Place 1 spray into both nostrils daily. 16 g 11  . Probiotic Product (ALIGN) 4 MG CAPS Take 1 capsule by mouth daily.    . rosuvastatin (CRESTOR) 20 MG tablet TAKE 1 TABLET BY MOUTH EVERY DAY 90 tablet 3  . traZODone (DESYREL) 50 MG tablet take 1 to 2 tablets by mouth once daily  0  . valACYclovir (VALTREX) 1000 MG tablet Take 2 pills, then repeat 2 pills 12 hours later.  Use as needed for cold sore outbreak 40 tablet 1  . Venlafaxine HCl 225 MG TB24 Take by mouth.     No current facility-administered medications on file prior to visit.     Review of Systems:  As per HPI- otherwise negative.   Physical Examination: Vitals:   07/31/18 1009  BP: 122/80  Pulse: 95  Resp: 16  SpO2: 96%   Vitals:   07/31/18 1009  Weight: 141 lb (64 kg)  Height: 5\' 2"  (1.575 m)   Body mass index is 25.79 kg/m. Ideal Body Weight: Weight in (lb) to have BMI = 25: 136.4  GEN: WDWN, NAD, Non-toxic, A & O x 3, looks well HEENT: Atraumatic, Normocephalic. Neck supple. No masses, No LAD. Ears and Nose: No external deformity. CV:  RRR, No M/G/R. No JVD. No thrill. No extra heart sounds. PULM: CTA B, no wheezes, crackles, rhonchi. No retractions. No resp. distress. No accessory muscle use. ABD: S, NT, ND, +BS. No rebound. No HSM. EXTR: No c/c/e NEURO Normal gait.  PSYCH: Normally interactive. Conversant. Not depressed  or anxious appearing.  Calm demeanor.  She has a few scattered, discrete skin lesion on her arms which may be AK   Assessment and Plan: OSA (obstructive sleep apnea) - Plan: Ambulatory referral to Pulmonology  Pre-diabetes  Dyslipidemia  AK (actinic keratosis) - Plan: Ambulatory referral to Dermatology  Discussed her recent labs with her She does still have pre-diabetes and dyslipidemia.  Continue to work on diet, exercise and take crestor Plan to meet for a CPE in about 6 months Referral for dermatology and also pulmonology to discuss potentially getting an oral appliance for her OSA  Signed Lamar Blinks, MD

## 2018-07-31 NOTE — Patient Instructions (Addendum)
We will set you up with pulmonology to discuss your sleep apnea I will also refer you to dermatology to discuss your skin concerns  Let's meet up in April for a physical exam

## 2018-08-28 IMAGING — US US ABDOMEN LIMITED
1 series · 14 of 25 positions shown · non-contrast
Comparison: CT 02/05/2017.

CLINICAL DATA: Right upper quadrant pain.

EXAM:
ULTRASOUND ABDOMEN LIMITED RIGHT UPPER QUADRANT

[Series 1: us abdomen limited · 0.20mm/px · 14 of 59 slices shown]
[im 1/59]
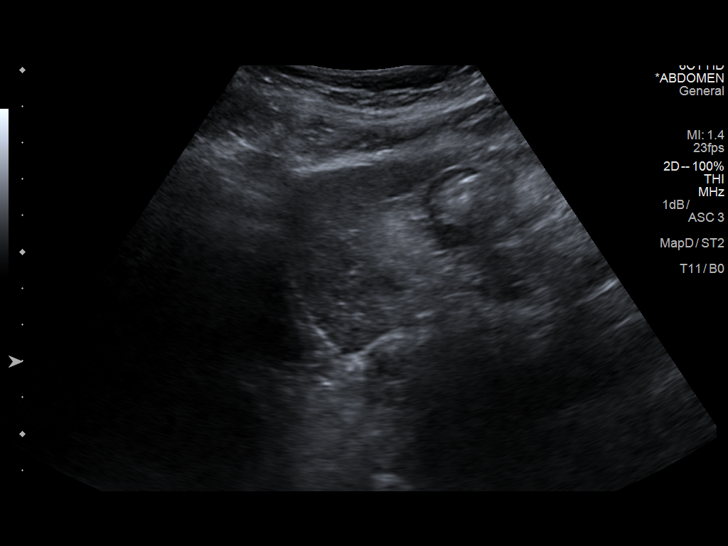
[im 5/59]
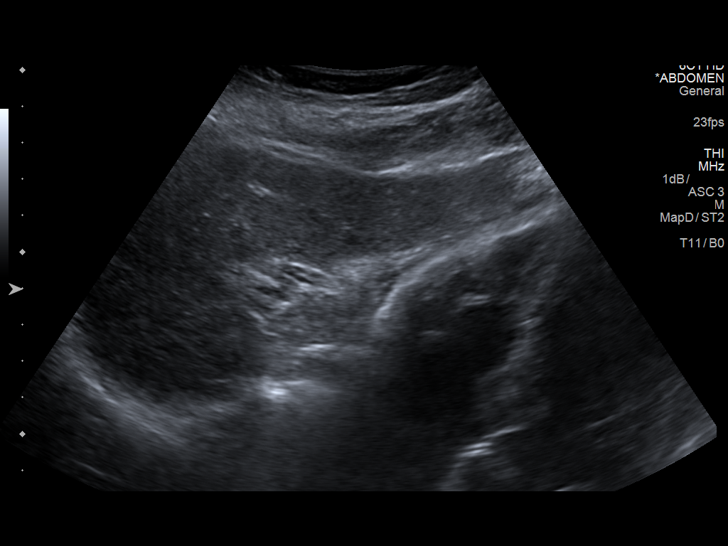
[im 10/59]
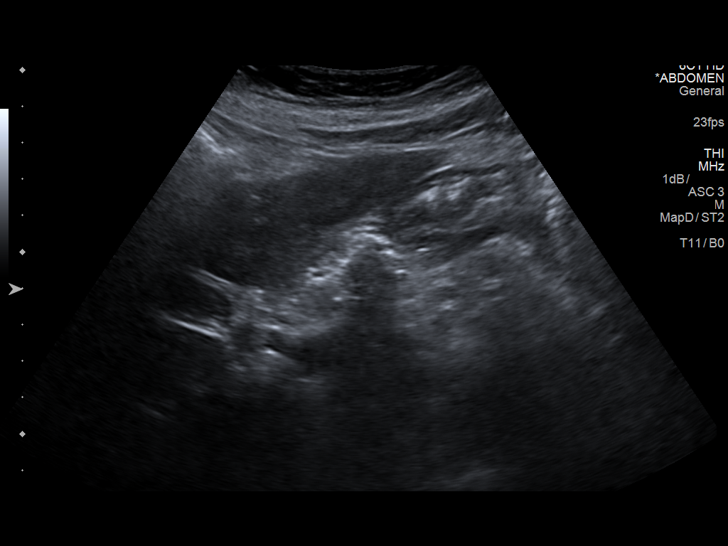
[im 15/59]
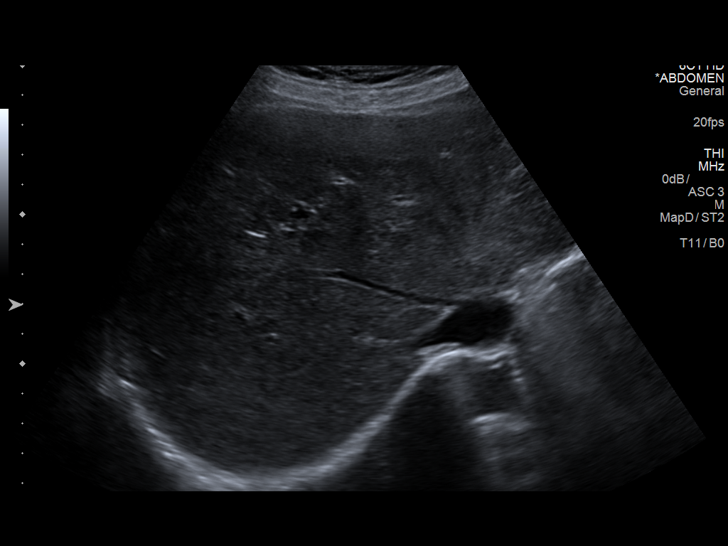
[im 20/59]
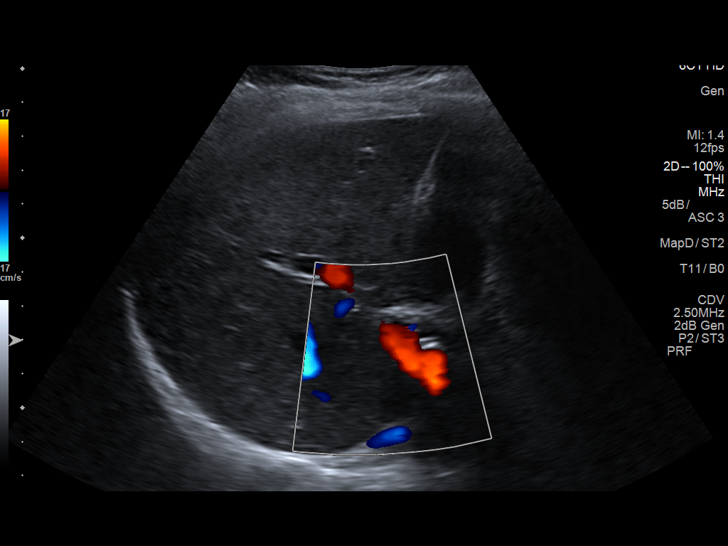
[im 22/59]
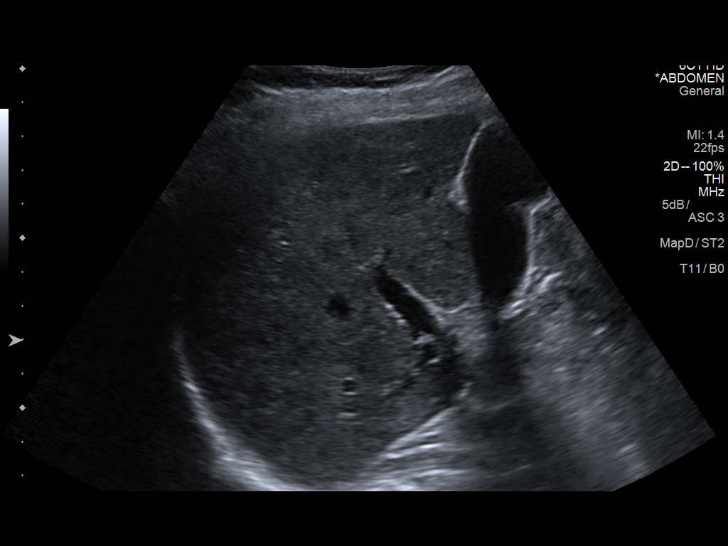
[im 27/59]
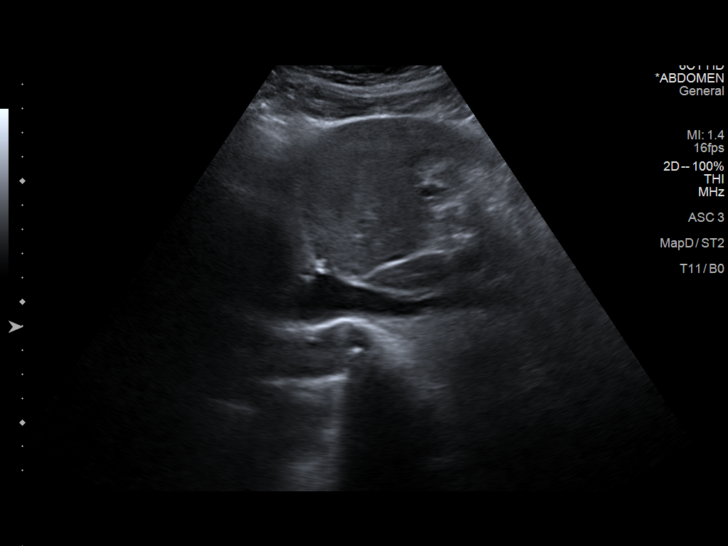
[im 32/59]
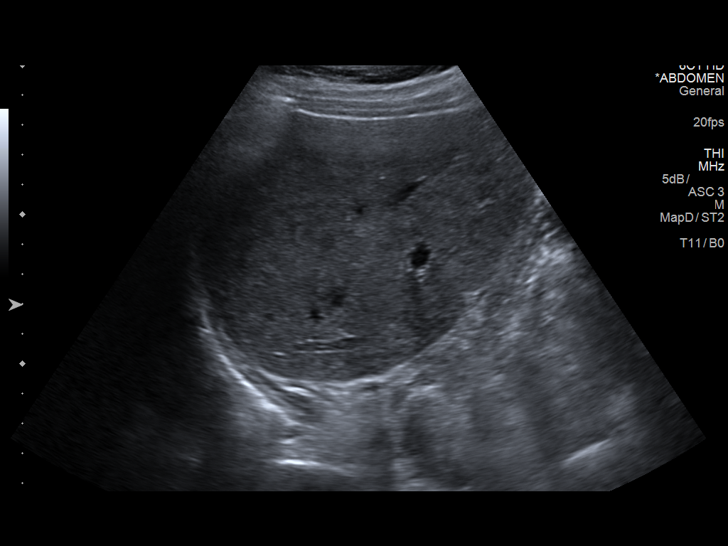
[im 37/59]
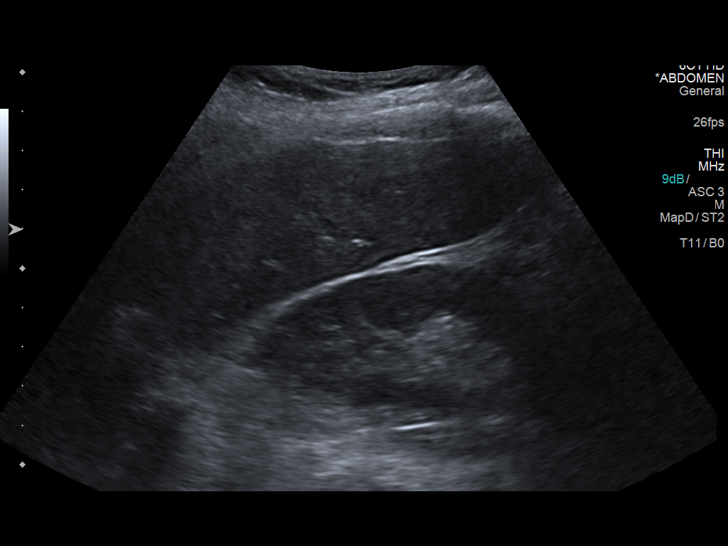
[im 39/59]
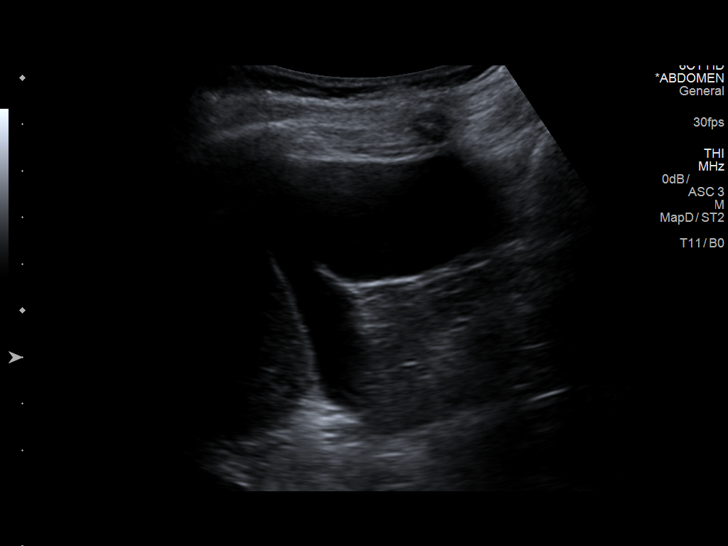
[im 44/59]
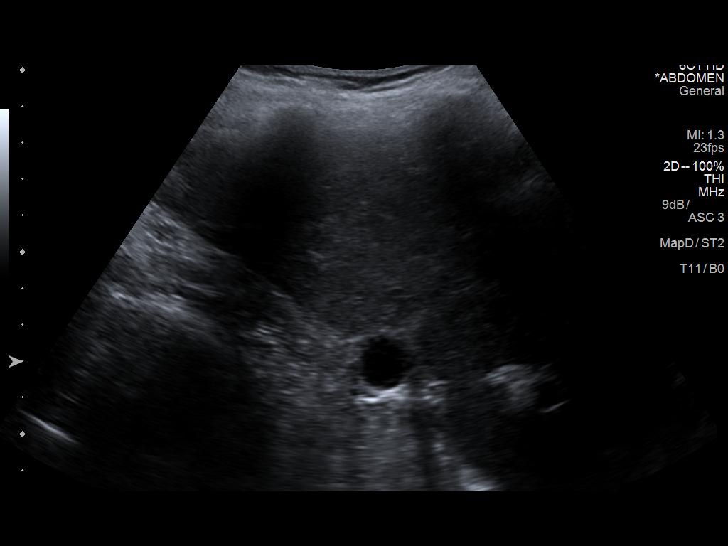
[im 49/59]
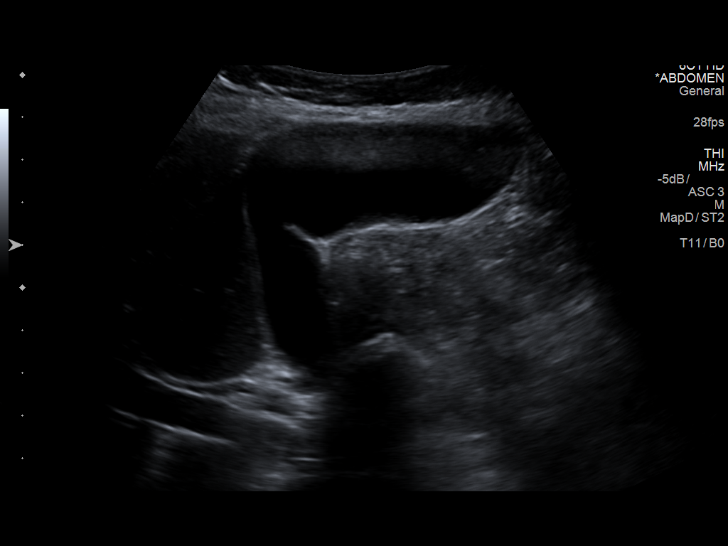
[im 54/59]
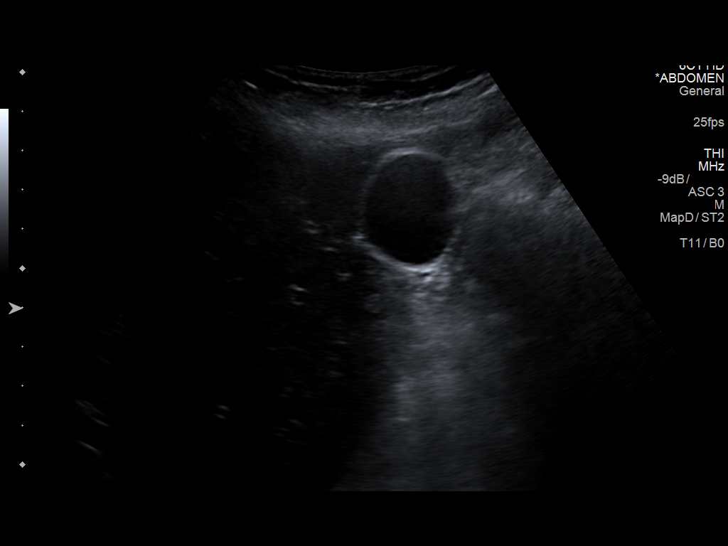
[im 59/59]
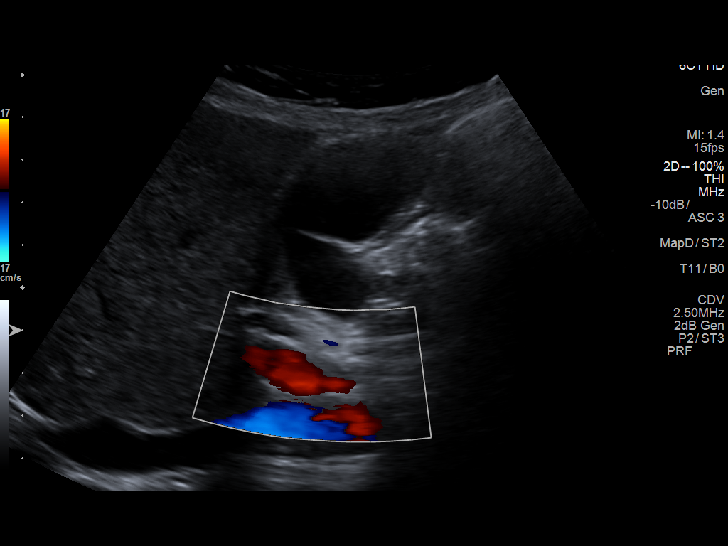

[14 of 25 positions shown; findings below may reference images not displayed]

FINDINGS: Gallbladder:

No gallstones or wall thickening visualized. No sonographic Murphy
sign noted by sonographer.

Common bile duct:

Diameter: 4.8 mm

Liver:

Mild increased echogenicity suggesting fatty infiltration and/or
hepatocellular disease. No focal hepatic abnormality identified.
Portal vein is patent on color Doppler imaging with normal direction
of blood flow towards the liver.
IMPRESSION: 1. Mild increased echogenicity of the liver suggesting fatty
infiltration and/or hepatocellular disease.

2.  No gallstones or biliary distention.

## 2018-09-11 DIAGNOSIS — L821 Other seborrheic keratosis: Secondary | ICD-10-CM | POA: Diagnosis not present

## 2018-09-11 DIAGNOSIS — L570 Actinic keratosis: Secondary | ICD-10-CM | POA: Diagnosis not present

## 2018-09-19 DIAGNOSIS — H43821 Vitreomacular adhesion, right eye: Secondary | ICD-10-CM | POA: Diagnosis not present

## 2018-09-19 DIAGNOSIS — D3132 Benign neoplasm of left choroid: Secondary | ICD-10-CM | POA: Diagnosis not present

## 2018-09-19 DIAGNOSIS — D3131 Benign neoplasm of right choroid: Secondary | ICD-10-CM | POA: Diagnosis not present

## 2018-10-08 ENCOUNTER — Telehealth: Payer: Self-pay | Admitting: Physician Assistant

## 2018-10-08 NOTE — Telephone Encounter (Signed)
She has found a lump on the outside of her rectum. It stings a little. She has not seen any blood with the stool. Agrees to have PCP look at it and will call them today.

## 2018-10-08 NOTE — Progress Notes (Deleted)
[place] at [facility] [address], [place], [place] [address] [phone]  Date:  10/09/2018   Name:  Jamie Levy   DOB:  1961/08/12   MRN:  [id number]  PCP:  Darreld Mclean, MD    Chief Complaint: No chief complaint on file.   History of Present Illness:  Jamie Levy is a 58 y.o. very pleasant female patient who presents with the following:  History of diverticulosis, hyperlipidemia.  Here today with concern of  Patient Active Problem List   Diagnosis Date Noted  . 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 Facial rash 05/20/2012  . Weight loss, abnormal 07/09/2011  . Skin cancer 02/16/2011  . CONSTIPATION 12/02/2009  . Flatulence, eructation, and gas pain 12/02/2009  . ABDOMINAL PAIN RIGHT LOWER QUADRANT 12/02/2009  . SLEEP APNEA 04/27/2009  . IRRITABLE BOWEL SYNDROME 03/28/2009  . RECTAL BLEEDING 03/15/2009  . Dyslipidemia 03/11/2009  . DEPRESSION 03/11/2009  . SINUSITIS, CHRONIC 03/11/2009  . Diverticulosis of colon (without mention of hemorrhage) 03/11/2009  . Abdominal pain, left lower quadrant 03/11/2009    Past Medical History:  Diagnosis Date  . Abdominal pain, left lower quadrant   . Allergy   . Anxiety   . Depressive disorder, not elsewhere classified   . Diarrhea of presumed infectious origin   . Diverticulosis of colon (without mention of hemorrhage)   . Facial ringworm May '14  . Irritable bowel syndrome   . Other and unspecified hyperlipidemia   . Sleep apnea   . Unspecified sinusitis (chronic)   . Unspecified sleep apnea     Past Surgical History:  Procedure Laterality Date  . ABDOMINAL HYSTERECTOMY  1997   BSo- Fibroids  . COLONOSCOPY    . elbow cystectomy    . NASAL SINUS SURGERY     x 7 most recent June '14    Social History   Tobacco Use  . Smoking status: Never Smoker  . Smokeless tobacco: Never Used  Substance Use Topics  . Alcohol use: Yes   Alcohol/week: 3.0 standard drinks    Types: 3 Standard drinks or equivalent per week    Comment: very rare, avoids alcohol when depressed  . Drug use: No    Family History  Problem Relation Age of Onset  . Ovarian cancer Mother   . Cancer Mother        ovarian  . Prostate cancer Father   . Colon polyps Father   . Heart disease Father        CAD/CABG/Stents  . Melanoma Father   . Depression Father        suicidal in the past  . Mental illness Father   . Cancer Maternal Grandmother        bladder  . Colon cancer Neg Hx   . Stomach cancer Neg Hx   . Esophageal cancer Neg Hx     Allergies  Allergen Reactions  . Compazine [Prochlorperazine Maleate] Anaphylaxis    Stroke like symptoms   . Iodinated Diagnostic Agents Hives  . Iohexol Hives and Other (See Comments)     Code: HIVES, Desc: pt developed one hive on rt upper arm and itching on upper lip after receiving 145ml omni 300, needs to be pre medicated, Onset Date: [id number] 05-17-2014 Patient able to tolerate water soluble PO contrast w/o reaction. rsm MUST PRE-MEDICATE WITH BENADRYL  . Codeine Nausea And Vomiting  . Buprenorphine Hcl Nausea And Vomiting  give benadryl IV side effects lighten  . Morphine And Related Nausea And Vomiting  . Sulfamethoxazole-Trimethoprim Hives and Other (See Comments)    Makes her anxious & keeps her up all night    Medication list has been reviewed and updated.  Current Outpatient Medications on File Prior to Visit  Medication Sig Dispense Refill  . aspirin 81 MG tablet Take 81 mg by mouth daily.    Marland Kitchen buPROPion (WELLBUTRIN XL) 300 MG 24 hr tablet Take 300 mg by mouth daily.    . cetirizine (ZYRTEC) 10 MG tablet Take 10 mg by mouth daily.    . fluticasone (FLONASE) 50 MCG/ACT nasal spray Place 1 spray into both nostrils daily. 16 g 11  . Probiotic Product (ALIGN) 4 MG CAPS Take 1 capsule by mouth daily.    . rosuvastatin (CRESTOR) 20 MG tablet TAKE 1 TABLET BY MOUTH EVERY DAY 90 tablet 3   . traZODone (DESYREL) 50 MG tablet take 1 to 2 tablets by mouth once daily  0  . valACYclovir (VALTREX) 1000 MG tablet Take 2 pills, then repeat 2 pills 12 hours later.  Use as needed for cold sore outbreak 40 tablet 1  . Venlafaxine HCl 225 MG TB24 Take by mouth.     No current facility-administered medications on file prior to visit.     Review of Systems:  As per HPI- otherwise negative.   Physical Examination: There were no vitals filed for this visit. There were no vitals filed for this visit. There is no height or weight on file to calculate BMI. Ideal Body Weight:    GEN: WDWN, NAD, Non-toxic, A & O x 3 HEENT: Atraumatic, Normocephalic. Neck supple. No masses, No LAD. Ears and Nose: No external deformity. CV: RRR, No M/G/R. No JVD. No thrill. No extra heart sounds. PULM: CTA B, no wheezes, crackles, rhonchi. No retractions. No resp. distress. No accessory muscle use. ABD: S, NT, ND, +BS. No rebound. No HSM. EXTR: No c/c/e NEURO Normal gait.  PSYCH: Normally interactive. Conversant. Not depressed or anxious appearing.  Calm demeanor.    Assessment and Plan: ***  Signed Lamar Blinks, MD

## 2018-10-08 NOTE — Telephone Encounter (Signed)
Pt called requesting to be seen today, she said that she just noticed a lump at the bottom of her rectum. Pls call her.

## 2018-10-09 ENCOUNTER — Ambulatory Visit: Payer: 59 | Admitting: Family Medicine

## 2018-10-09 ENCOUNTER — Encounter: Payer: Self-pay | Admitting: Family Medicine

## 2018-10-09 VITALS — BP 110/82 | HR 94 | Temp 98.1°F | Resp 16 | Ht 62.0 in | Wt 142.0 lb

## 2018-10-09 DIAGNOSIS — K649 Unspecified hemorrhoids: Secondary | ICD-10-CM | POA: Diagnosis not present

## 2018-10-09 NOTE — Patient Instructions (Addendum)
You appear to have an external hemorrhoid.  These are common, and not generally cause for alarm.  However, they can be uncomfortable.  I would recommend that you try over-the-counter Tucks pads, and/or Preparation H.  Also keeping your stool soft can be helpful, you might want to use a stool softener for a few days.  Let me know if you have any other concerns, if this hemorrhoid is getting larger or if it becomes painful.   Hemorrhoids Hemorrhoids are swollen veins in and around the rectum or anus. There are two types of hemorrhoids:  Internal hemorrhoids. These occur in the veins that are just inside the rectum. They may poke through to the outside and become irritated and painful.  External hemorrhoids. These occur in the veins that are outside the anus and can be felt as a painful swelling or hard lump near the anus. Most hemorrhoids do not cause serious problems, and they can be managed with home treatments such as diet and lifestyle changes. If home treatments do not help the symptoms, procedures can be done to shrink or remove the hemorrhoids. What are the causes? This condition is caused by increased pressure in the anal area. This pressure may result from various things, including:  Constipation.  Straining to have a bowel movement.  Diarrhea.  Pregnancy.  Obesity.  Sitting for long periods of time.  Heavy lifting or other activity that causes you to strain.  Anal sex.  Riding a bike for a long period of time. What are the signs or symptoms? Symptoms of this condition include:  Pain.  Anal itching or irritation.  Rectal bleeding.  Leakage of stool (feces).  Anal swelling.  One or more lumps around the anus. How is this diagnosed? This condition can often be diagnosed through a visual exam. Other exams or tests may also be done, such as:  An exam that involves feeling the rectal area with a gloved hand (digital rectal exam).  An exam of the anal canal that is  done using a small tube (anoscope).  A blood test, if you have lost a significant amount of blood.  A test to look inside the colon using a flexible tube with a camera on the end (sigmoidoscopy or colonoscopy). How is this treated? This condition can usually be treated at home. However, various procedures may be done if dietary changes, lifestyle changes, and other home treatments do not help your symptoms. These procedures can help make the hemorrhoids smaller or remove them completely. Some of these procedures involve surgery, and others do not. Common procedures include:  Rubber band ligation. Rubber bands are placed at the base of the hemorrhoids to cut off their blood supply.  Sclerotherapy. Medicine is injected into the hemorrhoids to shrink them.  Infrared coagulation. A type of light energy is used to get rid of the hemorrhoids.  Hemorrhoidectomy surgery. The hemorrhoids are surgically removed, and the veins that supply them are tied off.  Stapled hemorrhoidopexy surgery. The surgeon staples the base of the hemorrhoid to the rectal wall. Follow these instructions at home: Eating and drinking   Eat foods that have a lot of fiber in them, such as whole grains, beans, nuts, fruits, and vegetables.  Ask your health care provider about taking products that have added fiber (fiber supplements).  Reduce the amount of fat in your diet. You can do this by eating low-fat dairy products, eating less red meat, and avoiding processed foods.  Drink enough fluid to keep your  urine pale yellow. Managing pain and swelling   Take warm sitz baths for 20 minutes, 3-4 times a day to ease pain and discomfort. You may do this in a bathtub or using a portable sitz bath that fits over the toilet.  If directed, apply ice to the affected area. Using ice packs between sitz baths may be helpful. ? Put ice in a plastic bag. ? Place a towel between your skin and the bag. ? Leave the ice on for 20  minutes, 2-3 times a day. General instructions  Take over-the-counter and prescription medicines only as told by your health care provider.  Use medicated creams or suppositories as told.  Get regular exercise. Ask your health care provider how much and what kind of exercise is best for you. In general, you should do moderate exercise for at least 30 minutes on most days of the week (150 minutes each week). This can include activities such as walking, biking, or yoga.  Go to the bathroom when you have the urge to have a bowel movement. Do not wait.  Avoid straining to have bowel movements.  Keep the anal area dry and clean. Use wet toilet paper or moist towelettes after a bowel movement.  Do not sit on the toilet for long periods of time. This increases blood pooling and pain.  Keep all follow-up visits as told by your health care provider. This is important. Contact a health care provider if you have:  Increasing pain and swelling that are not controlled by treatment or medicine.  Difficulty having a bowel movement, or you are unable to have a bowel movement.  Pain or inflammation outside the area of the hemorrhoids. Get help right away if you have:  Uncontrolled bleeding from your rectum. Summary  Hemorrhoids are swollen veins in and around the rectum or anus.  Most hemorrhoids can be managed with home treatments such as diet and lifestyle changes.  Taking warm sitz baths can help ease pain and discomfort.  In severe cases, procedures or surgery can be done to shrink or remove the hemorrhoids. This information is not intended to replace advice given to you by your health care provider. Make sure you discuss any questions you have with your health care provider. Document Released: 08/24/2000 Document Revised: 01/16/2018 Document Reviewed: 01/16/2018 Elsevier Interactive Patient Education  2019 [employer].

## 2018-10-09 NOTE — Progress Notes (Signed)
[place] at [facility] [address], [place], [place] [address] [phone]  Date:  10/09/2018   Name:  Jamie Levy   DOB:  July 23, 1961   MRN:  [id number]  PCP:  Darreld Mclean, MD    Chief Complaint: Lump on Rectum (called GI told to call PCP, noticied yesterday-felt inflammed)   History of Present Illness:  Jamie Levy is a 58 y.o. very pleasant female patient who presents with the following:  She is here today with concern of a possible hemorrhoid She recently drove home from [place]- she got home and was showering, she felt a bump by her anus.  It is a bit tender, smaller today Not acutely painful This got her worried so she came in  No prior history of hemorrhoid Last colonoscopy 2017  She had a BM yesterday- no pain She notes that she tends towards constipation- this has been her lifelong pattern No bleeding No fever    Patient Active Problem List   Diagnosis Date Noted  . 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 Facial rash 05/20/2012  . Weight loss, abnormal 07/09/2011  . Skin cancer 02/16/2011  . CONSTIPATION 12/02/2009  . Flatulence, eructation, and gas pain 12/02/2009  . ABDOMINAL PAIN RIGHT LOWER QUADRANT 12/02/2009  . SLEEP APNEA 04/27/2009  . IRRITABLE BOWEL SYNDROME 03/28/2009  . RECTAL BLEEDING 03/15/2009  . Dyslipidemia 03/11/2009  . DEPRESSION 03/11/2009  . SINUSITIS, CHRONIC 03/11/2009  . Diverticulosis of colon (without mention of hemorrhage) 03/11/2009  . Abdominal pain, left lower quadrant 03/11/2009    Past Medical History:  Diagnosis Date  . Abdominal pain, left lower quadrant   . Allergy   . Anxiety   . Depressive disorder, not elsewhere classified   . Diarrhea of presumed infectious origin   . Diverticulosis of colon (without mention of hemorrhage)   . Facial ringworm May '14  . Irritable bowel syndrome   . Other and unspecified hyperlipidemia   . Sleep  apnea   . Unspecified sinusitis (chronic)   . Unspecified sleep apnea     Past Surgical History:  Procedure Laterality Date  . ABDOMINAL HYSTERECTOMY  1997   BSo- Fibroids  . COLONOSCOPY    . elbow cystectomy    . NASAL SINUS SURGERY     x 7 most recent June '14    Social History   Tobacco Use  . Smoking status: Never Smoker  . Smokeless tobacco: Never Used  Substance Use Topics  . Alcohol use: Yes    Alcohol/week: 3.0 standard drinks    Types: 3 Standard drinks or equivalent per week    Comment: very rare, avoids alcohol when depressed  . Drug use: No    Family History  Problem Relation Age of Onset  . Ovarian cancer Mother   . Cancer Mother        ovarian  . Prostate cancer Father   . Colon polyps Father   . Heart disease Father        CAD/CABG/Stents  . Melanoma Father   . Depression Father        suicidal in the past  . Mental illness Father   . Cancer Maternal Grandmother        bladder  . Colon cancer Neg Hx   . Stomach cancer Neg Hx   . Esophageal cancer Neg Hx     Allergies  Allergen Reactions  . Compazine [Prochlorperazine Maleate] Anaphylaxis  Stroke like symptoms   . Iodinated Diagnostic Agents Hives  . Iohexol Hives and Other (See Comments)     Code: HIVES, Desc: pt developed one hive on rt upper arm and itching on upper lip after receiving 142ml omni 300, needs to be pre medicated, Onset Date: [id number] 05-17-2014 Patient able to tolerate water soluble PO contrast w/o reaction. rsm MUST PRE-MEDICATE WITH BENADRYL  . Codeine Nausea And Vomiting  . Buprenorphine Hcl Nausea And Vomiting     give benadryl IV side effects lighten  . Morphine And Related Nausea And Vomiting  . Sulfamethoxazole-Trimethoprim Hives and Other (See Comments)    Makes her anxious & keeps her up all night    Medication list has been reviewed and updated.  Current Outpatient Medications on File Prior to Visit  Medication Sig Dispense Refill  . aspirin 81 MG tablet  Take 81 mg by mouth daily.    Marland Kitchen buPROPion (WELLBUTRIN XL) 300 MG 24 hr tablet Take 300 mg by mouth daily.    . cetirizine (ZYRTEC) 10 MG tablet Take 10 mg by mouth daily.    . fluticasone (FLONASE) 50 MCG/ACT nasal spray Place 1 spray into both nostrils daily. 16 g 11  . Probiotic Product (ALIGN) 4 MG CAPS Take 1 capsule by mouth daily.    . rosuvastatin (CRESTOR) 20 MG tablet TAKE 1 TABLET BY MOUTH EVERY DAY 90 tablet 3  . traZODone (DESYREL) 50 MG tablet take 1 to 2 tablets by mouth once daily  0  . valACYclovir (VALTREX) 1000 MG tablet Take 2 pills, then repeat 2 pills 12 hours later.  Use as needed for cold sore outbreak 40 tablet 1  . Venlafaxine HCl 225 MG TB24 Take by mouth.     No current facility-administered medications on file prior to visit.     Review of Systems:  As per HPI- otherwise negative. Otherwise feels well   Physical Examination: Vitals:   10/09/18 1020  BP: 110/82  Pulse: 94  Resp: 16  Temp: 98.1 F (36.7 C)  SpO2: 97%   Vitals:   10/09/18 1020  Weight: 142 lb (64.4 kg)  Height: 5\' 2"  (1.575 m)   Body mass index is 25.97 kg/m. Ideal Body Weight: Weight in (lb) to have BMI = 25: 136.4  GEN: WDWN, NAD, Non-toxic, A & O x 3, looks well, normal weight  HEENT: Atraumatic, Normocephalic. Neck supple. No masses, No LAD. Ears and Nose: No external deformity. CV: RRR, No M/G/R. No JVD. No thrill. No extra heart sounds. PULM: CTA B, no wheezes, crackles, rhonchi. No retractions. No resp. distress. No accessory muscle use. ABD: S, NT, ND, +BS. No rebound. No HSM.  Benign belly  EXTR: No c/c/e NEURO Normal gait.  PSYCH: Normally interactive. Conversant. Not depressed or anxious appearing.  Calm demeanor.  Rectal: non tender external hemorrhoid at 3:00, does not seem to be thrombosed.  No apparent blood on DRE Non tender on DRE   Assessment and Plan: Hemorrhoids, unspecified hemorrhoid type  Here today with external hemorrhoid - discussed in detail  with her Went over OTC remedies, gave handout She will let me know if I can be of further assistance   Signed [place] Blinks, MD

## 2018-11-18 DIAGNOSIS — L820 Inflamed seborrheic keratosis: Secondary | ICD-10-CM | POA: Diagnosis not present

## 2019-03-01 IMAGING — CT CT ABD-PELV W/O CM
2 of 4 series · 16 of 46 positions shown, 18 images · non-contrast
Comparison: 02/05/2017

CLINICAL DATA: Bilateral lower quadrant pain, nausea, and fever for
2 weeks. Rectal pressure. Previous diverticulitis.

EXAM:
CT ABDOMEN AND PELVIS WITHOUT CONTRAST
TECHNIQUE: Multidetector CT imaging of the abdomen and pelvis was performed
following the standard protocol without IV contrast.

[Series 2: abd/ pelvis · axial · 0.65mm/px · z∈[-442,-52]mm · 13 of 86 slices shown, 15 images]
[im 4/86  soft-tissue]
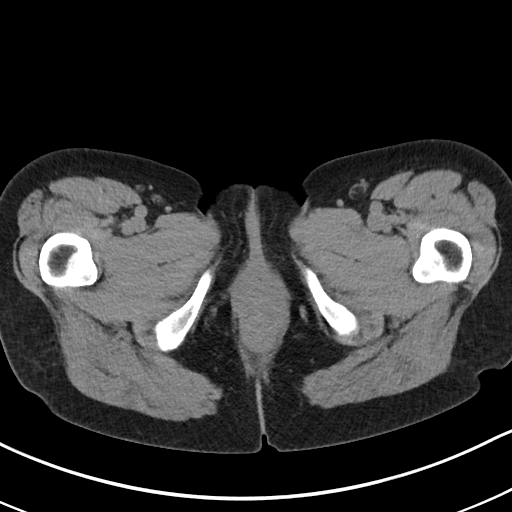
[im 4/86  bone]
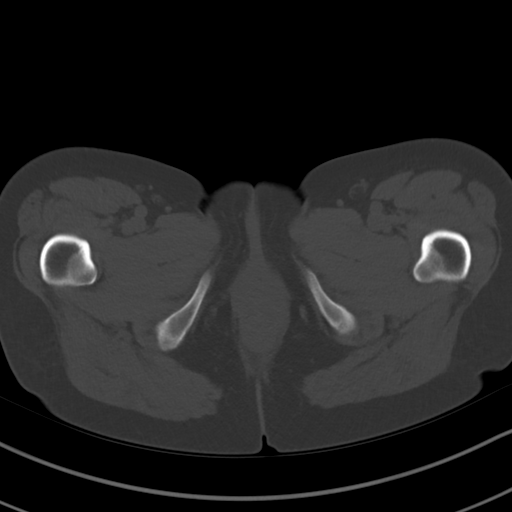
[im 11/86  soft-tissue]
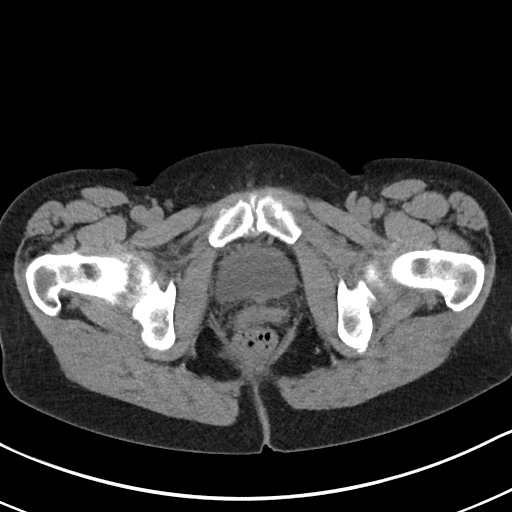
[im 18/86  soft-tissue]
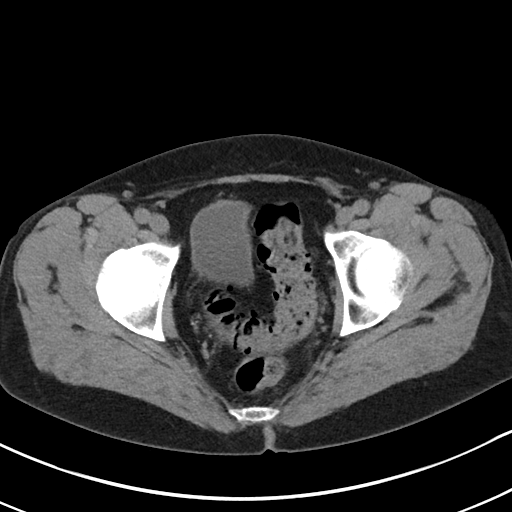
[im 25/86  soft-tissue]
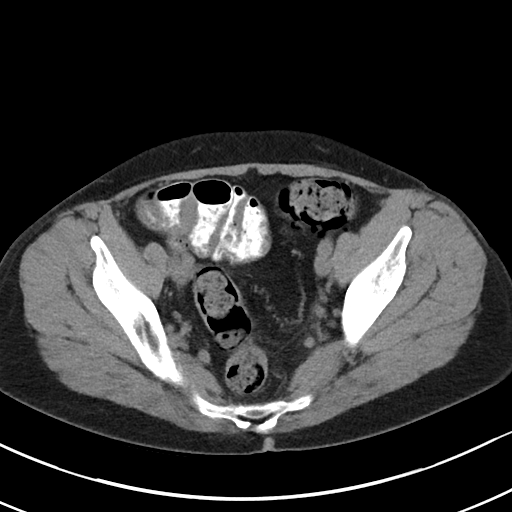
[im 29/86  soft-tissue]
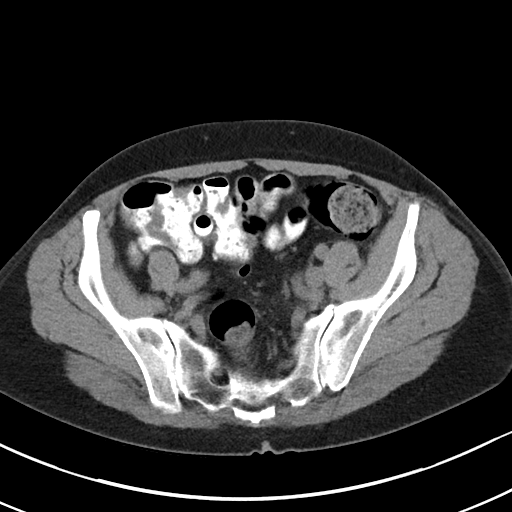
[im 36/86  soft-tissue]
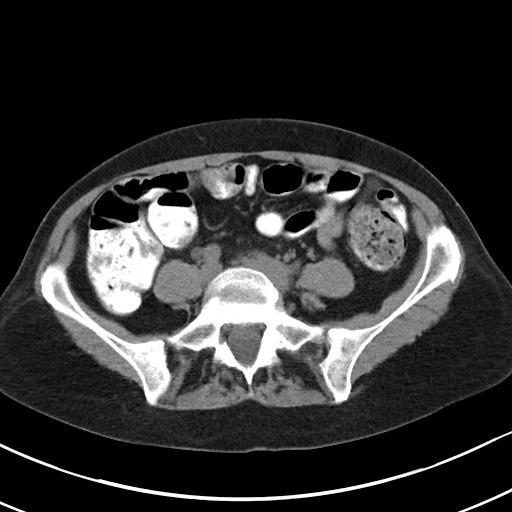
[im 43/86  soft-tissue]
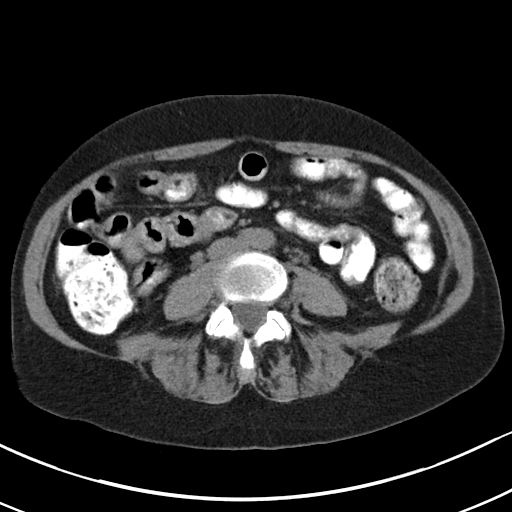
[im 50/86  soft-tissue]
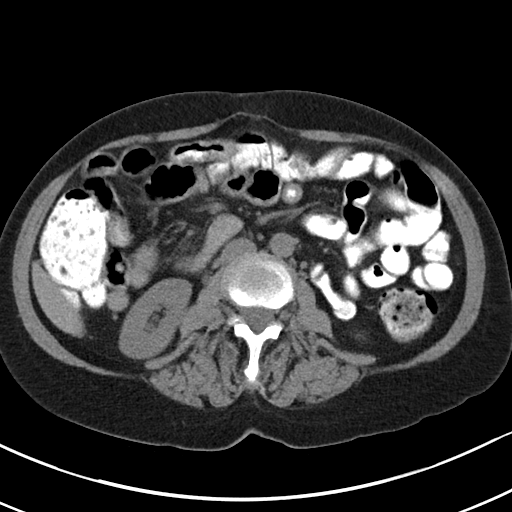
[im 57/86  soft-tissue]
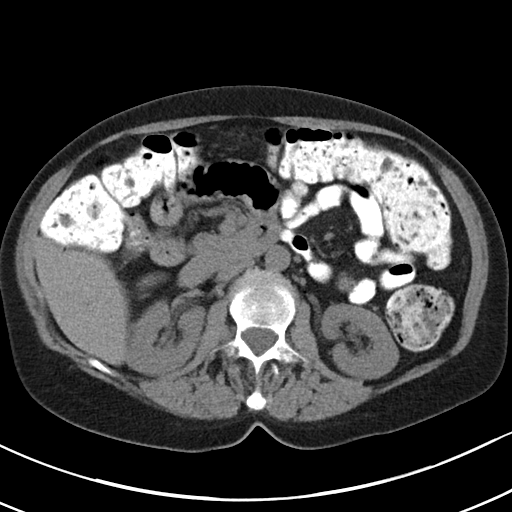
[im 57/86  bone]
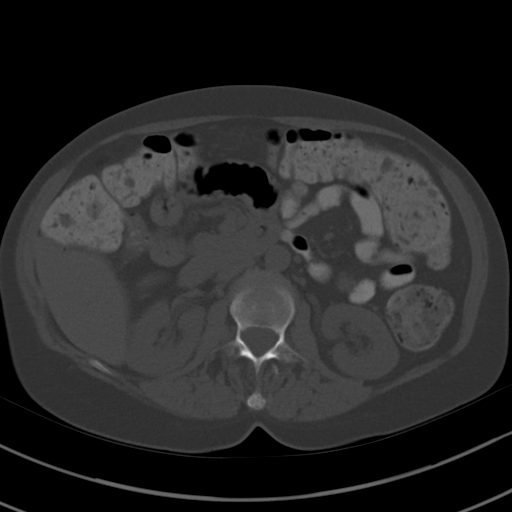
[im 61/86  soft-tissue]
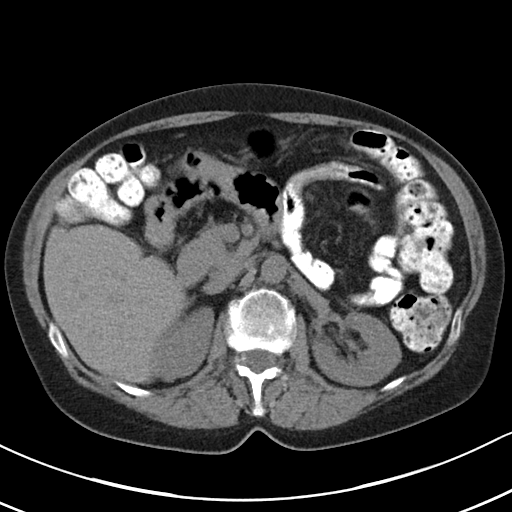
[im 68/86  soft-tissue]
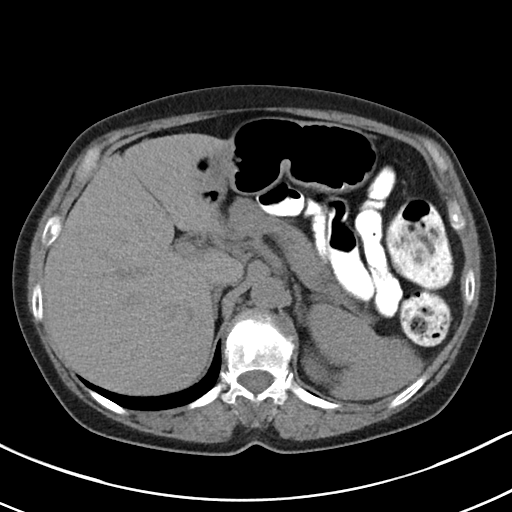
[im 75/86  soft-tissue]
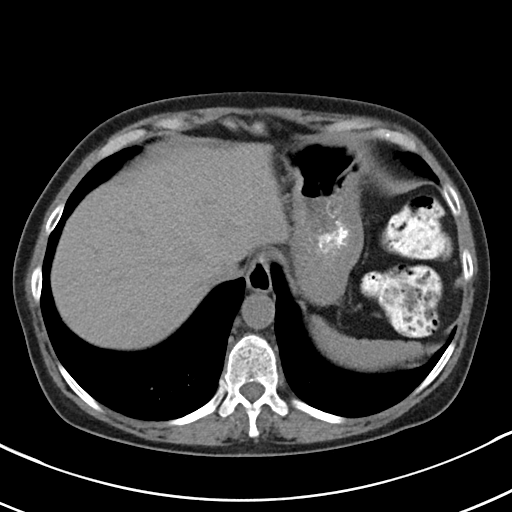
[im 82/86  soft-tissue]
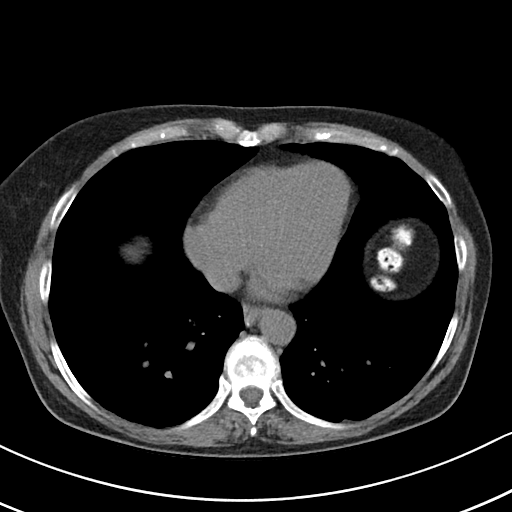

[Series 5: cor st · coronal · 0.67mm/px · 3 of 76 slices shown]
[im 26/76  soft-tissue]
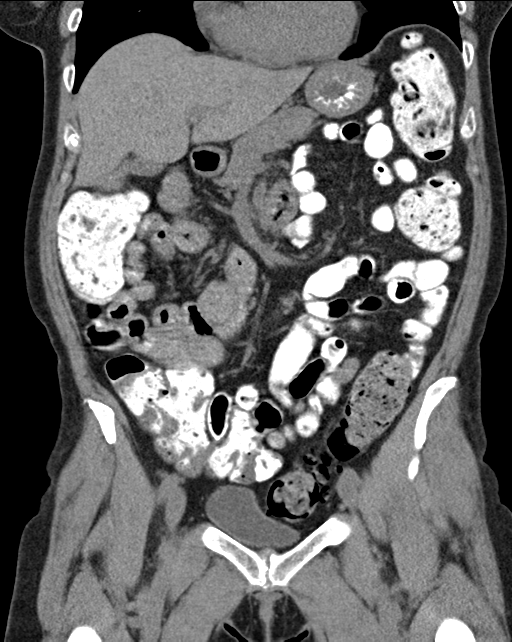
[im 34/76  soft-tissue]
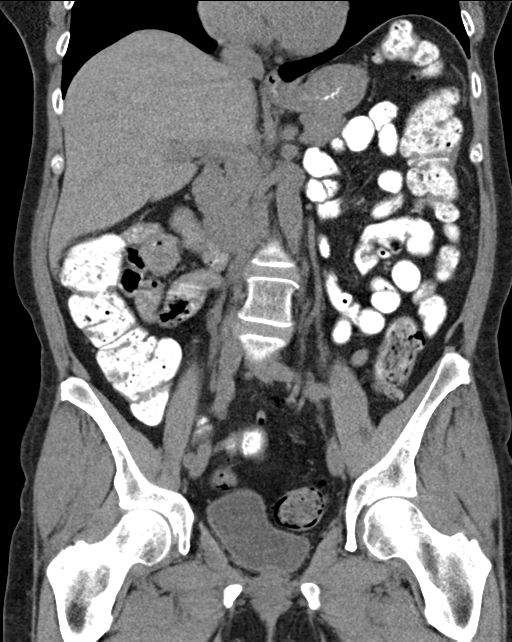
[im 42/76  soft-tissue]
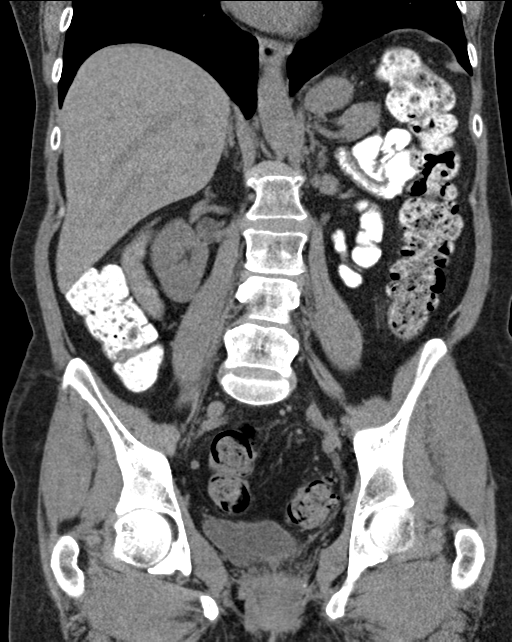

[16 of 46 positions shown; findings below may reference images not displayed]

FINDINGS: Lower chest: No acute findings.

Hepatobiliary: No mass visualized on this unenhanced exam.
Gallbladder is unremarkable.

Pancreas: No mass or inflammatory process visualized on this
unenhanced exam.

Spleen:  Within normal limits in size.

Adrenals/Urinary tract: No evidence of urolithiasis or
hydronephrosis. Unremarkable unopacified urinary bladder.

Stomach/Bowel: No evidence of obstruction, inflammatory process, or
abnormal fluid collections. Normal appendix visualized.
Diverticulosis is seen mainly involving the sigmoid colon, however
there is no evidence of diverticulitis.

Vascular/Lymphatic: No pathologically enlarged lymph nodes
identified. No evidence of abdominal aortic aneurysm.

Reproductive: Prior hysterectomy noted. Adnexal regions are
unremarkable in appearance.

Other:  None.

Musculoskeletal:  No suspicious bone lesions identified.
IMPRESSION: Colonic diverticulosis, without radiographic evidence of
diverticulitis or other acute findings.

## 2019-03-18 ENCOUNTER — Encounter: Payer: Self-pay | Admitting: Family Medicine

## 2019-05-17 NOTE — Progress Notes (Deleted)
[place] at [facility] [address], [place], [place] [address] [phone]  Date:  05/20/2019   Name:  Jamie Levy   DOB:  February 16, 1961   MRN:  [id number]  PCP:  Jamie Mclean, MD    Chief Complaint: No chief complaint on file.   History of Present Illness:  Jamie Levy is a 58 y.o. very pleasant female patient who presents with the following:  Here today to discuss medication refills History of hyperlipidemia, OSA, IBS Last seen by myself in January with concern of hemorrhoid   Due for flu shot Mammo due Colon UTD Most recent labs about one year ago, update today   Patient Active Problem List   Diagnosis Date Noted  . 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 Facial rash 05/20/2012  . Weight loss, abnormal 07/09/2011  . Skin cancer 02/16/2011  . CONSTIPATION 12/02/2009  . Flatulence, eructation, and gas pain 12/02/2009  . ABDOMINAL PAIN RIGHT LOWER QUADRANT 12/02/2009  . SLEEP APNEA 04/27/2009  . IRRITABLE BOWEL SYNDROME 03/28/2009  . RECTAL BLEEDING 03/15/2009  . Dyslipidemia 03/11/2009  . DEPRESSION 03/11/2009  . SINUSITIS, CHRONIC 03/11/2009  . Diverticulosis of colon (without mention of hemorrhage) 03/11/2009  . Abdominal pain, left lower quadrant 03/11/2009    Past Medical History:  Diagnosis Date  . Abdominal pain, left lower quadrant   . Allergy   . Anxiety   . Depressive disorder, not elsewhere classified   . Diarrhea of presumed infectious origin   . Diverticulosis of colon (without mention of hemorrhage)   . Facial ringworm May '14  . Irritable bowel syndrome   . Other and unspecified hyperlipidemia   . Sleep apnea   . Unspecified sinusitis (chronic)   . Unspecified sleep apnea     Past Surgical History:  Procedure Laterality Date  . ABDOMINAL HYSTERECTOMY  1997   BSo- Fibroids  . COLONOSCOPY    . elbow cystectomy    . NASAL SINUS SURGERY     x 7 most recent June '14     Social History   Tobacco Use  . Smoking status: Never Smoker  . Smokeless tobacco: Never Used  Substance Use Topics  . Alcohol use: Yes    Alcohol/week: 3.0 standard drinks    Types: 3 Standard drinks or equivalent per week    Comment: very rare, avoids alcohol when depressed  . Drug use: No    Family History  Problem Relation Age of Onset  . Ovarian cancer Mother   . Cancer Mother        ovarian  . Prostate cancer Father   . Colon polyps Father   . Heart disease Father        CAD/CABG/Stents  . Melanoma Father   . Depression Father        suicidal in the past  . Mental illness Father   . Cancer Maternal Grandmother        bladder  . Colon cancer Neg Hx   . Stomach cancer Neg Hx   . Esophageal cancer Neg Hx     Allergies  Allergen Reactions  . Compazine [Prochlorperazine Maleate] Anaphylaxis    Stroke like symptoms   . Iodinated Diagnostic Agents Hives  . Iohexol Hives and Other (See Comments)     Code: HIVES, Desc: pt developed one hive on rt upper arm and itching on upper lip after receiving 122ml omni 300, needs to be pre  medicated, Onset Date: [id number] 05-17-2014 Patient able to tolerate water soluble PO contrast w/o reaction. rsm MUST PRE-MEDICATE WITH BENADRYL  . Codeine Nausea And Vomiting  . Buprenorphine Hcl Nausea And Vomiting     give benadryl IV side effects lighten  . Morphine And Related Nausea And Vomiting  . Sulfamethoxazole-Trimethoprim Hives and Other (See Comments)    Makes her anxious & keeps her up all night    Medication list has been reviewed and updated.  Current Outpatient Medications on File Prior to Visit  Medication Sig Dispense Refill  . aspirin 81 MG tablet Take 81 mg by mouth daily.    Marland Kitchen buPROPion (WELLBUTRIN XL) 300 MG 24 hr tablet Take 300 mg by mouth daily.    . cetirizine (ZYRTEC) 10 MG tablet Take 10 mg by mouth daily.    . fluticasone (FLONASE) 50 MCG/ACT nasal spray Place 1 spray into both nostrils daily. 16 g 11  .  Probiotic Product (ALIGN) 4 MG CAPS Take 1 capsule by mouth daily.    . rosuvastatin (CRESTOR) 20 MG tablet TAKE 1 TABLET BY MOUTH EVERY DAY 90 tablet 3  . traZODone (DESYREL) 50 MG tablet take 1 to 2 tablets by mouth once daily  0  . valACYclovir (VALTREX) 1000 MG tablet Take 2 pills, then repeat 2 pills 12 hours later.  Use as needed for cold sore outbreak 40 tablet 1  . Venlafaxine HCl 225 MG TB24 Take by mouth.     No current facility-administered medications on file prior to visit.     Review of Systems:  As per HPI- otherwise negative.   Physical Examination: There were no vitals filed for this visit. There were no vitals filed for this visit. There is no height or weight on file to calculate BMI. Ideal Body Weight:    GEN: WDWN, NAD, Non-toxic, A & O x 3 HEENT: Atraumatic, Normocephalic. Neck supple. No masses, No LAD. Ears and Nose: No external deformity. CV: RRR, No M/G/R. No JVD. No thrill. No extra heart sounds. PULM: CTA B, no wheezes, crackles, rhonchi. No retractions. No resp. distress. No accessory muscle use. ABD: S, NT, ND, +BS. No rebound. No HSM. EXTR: No c/c/e NEURO Normal gait.  PSYCH: Normally interactive. Conversant. Not depressed or anxious appearing.  Calm demeanor.    Assessment and Plan: ***  Signed Lamar Blinks, MD

## 2019-05-20 ENCOUNTER — Ambulatory Visit: Payer: 59 | Admitting: Family Medicine

## 2019-05-21 NOTE — Patient Instructions (Addendum)
It was good to see you again today. I will be in touch with your labs asap  Please do consider getting your flu shot later on this fall, and I would also suggest the shingles vaccine at your convenience   Do work on getting into better shape with gradual increase in exercise.  However your weight is actually stable from previous, perhaps up just a few lbs   Health Maintenance, Female Adopting a healthy lifestyle and getting preventive care are important in promoting health and wellness. Ask your health care provider about:  The right schedule for you to have regular tests and exams.  Things you can do on your own to prevent diseases and keep yourself healthy. What should I know about diet, weight, and exercise? Eat a healthy diet   Eat a diet that includes plenty of vegetables, fruits, low-fat dairy products, and lean protein.  Do not eat a lot of foods that are high in solid fats, added sugars, or sodium. Maintain a healthy weight Body mass index (BMI) is used to identify weight problems. It estimates body fat based on height and weight. Your health care provider can help determine your BMI and help you achieve or maintain a healthy weight. Get regular exercise Get regular exercise. This is one of the most important things you can do for your health. Most adults should:  Exercise for at least 150 minutes each week. The exercise should increase your heart rate and make you sweat (moderate-intensity exercise).  Do strengthening exercises at least twice a week. This is in addition to the moderate-intensity exercise.  Spend less time sitting. Even light physical activity can be beneficial. Watch cholesterol and blood lipids Have your blood tested for lipids and cholesterol at 60 years of age, then have this test every 5 years. Have your cholesterol levels checked more often if:  Your lipid or cholesterol levels are high.  You are older than 60 years of age.  You are at high risk for  heart disease. What should I know about cancer screening? Depending on your health history and family history, you may need to have cancer screening at various ages. This may include screening for:  Breast cancer.  Cervical cancer.  Colorectal cancer.  Skin cancer.  Lung cancer. What should I know about heart disease, diabetes, and high blood pressure? Blood pressure and heart disease  High blood pressure causes heart disease and increases the risk of stroke. This is more likely to develop in people who have high blood pressure readings, are of African descent, or are overweight.  Have your blood pressure checked: ? Every 3-5 years if you are 33-33 years of age. ? Every year if you are 80 years old or older. Diabetes Have regular diabetes screenings. This checks your fasting blood sugar level. Have the screening done:  Once every three years after age 8 if you are at a normal weight and have a low risk for diabetes.  More often and at a younger age if you are overweight or have a high risk for diabetes. What should I know about preventing infection? Hepatitis B If you have a higher risk for hepatitis B, you should be screened for this virus. Talk with your health care provider to find out if you are at risk for hepatitis B infection. Hepatitis C Testing is recommended for:  Everyone born from [birth year] through 1967.  Anyone with known risk factors for hepatitis C. Sexually transmitted infections (STIs)  Get screened for STIs,  including gonorrhea and chlamydia, if: ? You are sexually active and are younger than 60 years of age. ? You are older than 60 years of age and your health care provider tells you that you are at risk for this type of infection. ? Your sexual activity has changed since you were last screened, and you are at increased risk for chlamydia or gonorrhea. Ask your health care provider if you are at risk.  Ask your health care provider about whether you are at  high risk for HIV. Your health care provider may recommend a prescription medicine to help prevent HIV infection. If you choose to take medicine to prevent HIV, you should first get tested for HIV. You should then be tested every 3 months for as long as you are taking the medicine. Pregnancy  If you are about to stop having your period (premenopausal) and you may become pregnant, seek counseling before you get pregnant.  Take 400 to 800 micrograms (mcg) of folic acid every day if you become pregnant.  Ask for birth control (contraception) if you want to prevent pregnancy. Osteoporosis and menopause Osteoporosis is a disease in which the bones lose minerals and strength with aging. This can result in bone fractures. If you are 5 years old or older, or if you are at risk for osteoporosis and fractures, ask your health care provider if you should:  Be screened for bone loss.  Take a calcium or vitamin D supplement to lower your risk of fractures.  Be given hormone replacement therapy (HRT) to treat symptoms of menopause. Follow these instructions at home: Lifestyle  Do not use any products that contain nicotine or tobacco, such as cigarettes, e-cigarettes, and chewing tobacco. If you need help quitting, ask your health care provider.  Do not use street drugs.  Do not share needles.  Ask your health care provider for help if you need support or information about quitting drugs. Alcohol use  Do not drink alcohol if: ? Your health care provider tells you not to drink. ? You are pregnant, may be pregnant, or are planning to become pregnant.  If you drink alcohol: ? Limit how much you use to 0-1 drink a day. ? Limit intake if you are breastfeeding.  Be aware of how much alcohol is in your drink. In the [place] one drink equals one 12 oz bottle of beer (355 mL), one 5 oz glass of wine (148 mL), or one 1 oz glass of hard liquor (44 mL). General instructions  Schedule regular health,  dental, and eye exams.  Stay current with your vaccines.  Tell your health care provider if: ? You often feel depressed. ? You have ever been abused or do not feel safe at home. Summary  Adopting a healthy lifestyle and getting preventive care are important in promoting health and wellness.  Follow your health care provider's instructions about healthy diet, exercising, and getting tested or screened for diseases.  Follow your health care provider's instructions on monitoring your cholesterol and blood pressure. This information is not intended to replace advice given to you by your health care provider. Make sure you discuss any questions you have with your health care provider. Document Released: 03/12/2011 Document Revised: 08/20/2018 Document Reviewed: 08/20/2018 Elsevier Patient Education  2020 [employer].

## 2019-05-21 NOTE — Progress Notes (Addendum)
[facility] [address] [place], [place], [place] [address] [id number] [phone]  Date:  05/25/2019   Name:  Jamie Levy   DOB:  02-16-1961   MRN:  [id number]  PCP:  Jamie Mclean, MD    Chief Complaint: Med refills (blood work, feeling ran down), Weight Gain (trouble losing weight ), and Colonoscopy   History of Present Illness:  Jamie Levy is a 58 y.o. very pleasant female patient who presents with the following:  Here today for follow-up visit/CPE History of sleep apnea, IBS, dyslipidemia, depression, skin cancer I last saw Jamie Levy in January with concern of hemorrhoids  Her psychiatrist is Dr. Nils Levy- a new person  Due for flu shot- she declines today  Mammogram April 2019- scheduled for this week Colonoscopy up-to-date Routine labs today- she is fasting today  Suggested shingrix- declines today  S/p hyst for benign disease, she still sees OBG and does have paps on occasion - her ovaries were removed as well due to family histo yr of ovarian cancer   Crestor Baby aspirin Medications per her psychiatrist  She notes that she has gained some weight and suffered from depression more during the shut down She does have OSA- her DDS does manage oral appliance but she needs a sleep study to be done   One of her sons got laid off, and the other is in school at [employer] Readings from Last 3 Encounters:  05/25/19 143 lb (64.9 kg)  10/09/18 142 lb (64.4 kg)  07/31/18 141 lb (64 kg)     Patient Active Problem List   Diagnosis Date Noted  . 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 Facial rash 05/20/2012  . Weight loss, abnormal 07/09/2011  . Skin cancer 02/16/2011  . CONSTIPATION 12/02/2009  . Flatulence, eructation, and gas pain 12/02/2009  . ABDOMINAL PAIN RIGHT LOWER QUADRANT 12/02/2009  . SLEEP APNEA 04/27/2009  . IRRITABLE BOWEL SYNDROME 03/28/2009  . RECTAL BLEEDING 03/15/2009  .  Dyslipidemia 03/11/2009  . DEPRESSION 03/11/2009  . SINUSITIS, CHRONIC 03/11/2009  . Diverticulosis of colon (without mention of hemorrhage) 03/11/2009  . Abdominal pain, left lower quadrant 03/11/2009    Past Medical History:  Diagnosis Date  . Abdominal pain, left lower quadrant   . Allergy   . Anxiety   . Depressive disorder, not elsewhere classified   . Diarrhea of presumed infectious origin   . Diverticulosis of colon (without mention of hemorrhage)   . Facial ringworm May '14  . Irritable bowel syndrome   . Other and unspecified hyperlipidemia   . Sleep apnea   . Unspecified sinusitis (chronic)   . Unspecified sleep apnea     Past Surgical History:  Procedure Laterality Date  . ABDOMINAL HYSTERECTOMY  1997   BSo- Fibroids  . COLONOSCOPY    . elbow cystectomy    . NASAL SINUS SURGERY     x 7 most recent June '14    Social History   Tobacco Use  . Smoking status: Never Smoker  . Smokeless tobacco: Never Used  Substance Use Topics  . Alcohol use: Yes    Alcohol/week: 3.0 standard drinks    Types: 3 Standard drinks or equivalent per week    Comment: very rare, avoids alcohol when depressed  . Drug use: No    Family History  Problem Relation Age of Onset  . Ovarian cancer Mother   . Cancer Mother  ovarian  . Prostate cancer Father   . Colon polyps Father   . Heart disease Father        CAD/CABG/Stents  . Melanoma Father   . Depression Father        suicidal in the past  . Mental illness Father   . Cancer Maternal Grandmother        bladder  . Colon cancer Neg Hx   . Stomach cancer Neg Hx   . Esophageal cancer Neg Hx     Allergies  Allergen Reactions  . Compazine [Prochlorperazine Maleate] Anaphylaxis    Stroke like symptoms   . Iodinated Diagnostic Agents Hives  . Iohexol Hives and Other (See Comments)     Code: HIVES, Desc: pt developed one hive on rt upper arm and itching on upper lip after receiving 14ml omni 300, needs to be pre  medicated, Onset Date: [id number] 05-17-2014 Patient able to tolerate water soluble PO contrast w/o reaction. rsm MUST PRE-MEDICATE WITH BENADRYL  . Codeine Nausea And Vomiting  . Buprenorphine Hcl Nausea And Vomiting     give benadryl IV side effects lighten  . Morphine And Related Nausea And Vomiting  . Sulfamethoxazole-Trimethoprim Hives and Other (See Comments)    Makes her anxious & keeps her up all night    Medication list has been reviewed and updated.  Current Outpatient Medications on File Prior to Visit  Medication Sig Dispense Refill  . aspirin 81 MG tablet Take 81 mg by mouth daily.    Marland Kitchen buPROPion (WELLBUTRIN XL) 300 MG 24 hr tablet Take 300 mg by mouth daily.    . cetirizine (ZYRTEC) 10 MG tablet Take 10 mg by mouth daily.    . fluticasone (FLONASE) 50 MCG/ACT nasal spray Place 1 spray into both nostrils daily. 16 g 11  . Probiotic Product (ALIGN) 4 MG CAPS Take 1 capsule by mouth daily.    . rosuvastatin (CRESTOR) 20 MG tablet TAKE 1 TABLET BY MOUTH EVERY DAY 90 tablet 3  . traZODone (DESYREL) 50 MG tablet take 1 to 2 tablets by mouth once daily  0  . valACYclovir (VALTREX) 1000 MG tablet Take 2 pills, then repeat 2 pills 12 hours later.  Use as needed for cold sore outbreak 40 tablet 1  . Venlafaxine HCl 225 MG TB24 Take by mouth.     No current facility-administered medications on file prior to visit.     Review of Systems:  As per HPI- otherwise negative. No CP or abnormal SOB   Physical Examination: Vitals:   05/25/19 0810  BP: 122/80  Pulse: 72  Resp: 16  Temp: (!) 97 F (36.1 C)  SpO2: 98%   Vitals:   05/25/19 0810  Weight: 143 lb (64.9 kg)  Height: 5\' 2"  (1.575 m)   Body mass index is 26.16 kg/m. Ideal Body Weight: Weight in (lb) to have BMI = 25: 136.4  GEN: WDWN, NAD, Non-toxic, A & O x 3, mild overweight, looks well  HEENT: Atraumatic, Normocephalic. Neck supple. No masses, No LAD.  PEERl Ears and Nose: No external deformity. CV: RRR, No  M/G/R. No JVD. No thrill. No extra heart sounds. PULM: CTA B, no wheezes, crackles, rhonchi. No retractions. No resp. distress. No accessory muscle use. ABD: S, NT, ND, +BS. No rebound. No HSM. EXTR: No c/c/e NEURO Normal gait.  PSYCH: Normally interactive. Conversant. Not depressed or anxious appearing.  Calm demeanor.    Assessment and Plan: Physical exam  OSA (obstructive sleep apnea) -  Plan: Ambulatory referral to Neurology  Pre-diabetes - Plan: Comprehensive metabolic panel, Hemoglobin A1c  Dyslipidemia - Plan: Lipid panel, rosuvastatin (CRESTOR) 20 MG tablet  Screening for HIV (human immunodeficiency virus) - Plan: HIV Antibody (routine testing w rflx)  Screening for thyroid disorder - Plan: TSH  Screening for deficiency anemia - Plan: CBC  Non-seasonal allergic rhinitis due to pollen - Plan: fluticasone (FLONASE) 50 MCG/ACT nasal spray  Estrogen deficiency - Plan: DG Bone Density  Here today for a CPE mammo is scheduled for this week- will add dexa, she will call [facility] and add this to her appt later on this week Pt is concerned about difficulty in losing weight - check TSH, order neurology eval for sleep study    Signed Lamar Blinks, MD  Pt would like dexa sent to [facility], Dr Alfred Levins   Received her labs, letter to patient A1c is stable Lipids also stable Results for orders placed or performed in visit on 05/25/19  CBC  Result Value Ref Range   WBC 5.7 4.0 - 10.5 K/uL   RBC 4.72 3.87 - 5.11 Mil/uL   Platelets 278.0 150.0 - 400.0 K/uL   Hemoglobin 13.9 12.0 - 15.0 g/dL   HCT 41.6 36.0 - 46.0 %   MCV 88.2 78.0 - 100.0 fl   MCHC 33.5 30.0 - 36.0 g/dL   RDW 14.2 11.5 - 15.5 %  Comprehensive metabolic panel  Result Value Ref Range   Sodium 139 135 - 145 mEq/L   Potassium 4.2 3.5 - 5.1 mEq/L   Chloride 100 96 - 112 mEq/L   CO2 30 19 - 32 mEq/L   Glucose, Bld 90 70 - 99 mg/dL   BUN 15 6 - 23 mg/dL   Creatinine, Ser 0.87 0.40 - 1.20 mg/dL   Total Bilirubin 0.5  0.2 - 1.2 mg/dL   Alkaline Phosphatase 59 39 - 117 U/L   AST 17 0 - 37 U/L   ALT 16 0 - 35 U/L   Total Protein 6.5 6.0 - 8.3 g/dL   Albumin 4.1 3.5 - 5.2 g/dL   Calcium 9.1 8.4 - 10.5 mg/dL   GFR 66.33 >60.00 mL/min  Hemoglobin A1c  Result Value Ref Range   Hgb A1c MFr Bld 6.1 4.6 - 6.5 %  Lipid panel  Result Value Ref Range   Cholesterol 283 (H) 0 - 200 mg/dL   Triglycerides 143.0 0.0 - 149.0 mg/dL   HDL 69.70 >39.00 mg/dL   VLDL 28.6 0.0 - 40.0 mg/dL   LDL Cholesterol 185 (H) 0 - 99 mg/dL   Total CHOL/HDL Ratio 4    NonHDL 213.74   TSH  Result Value Ref Range   TSH 3.23 0.35 - 4.50 uIU/mL

## 2019-05-25 ENCOUNTER — Encounter: Payer: Self-pay | Admitting: Family Medicine

## 2019-05-25 ENCOUNTER — Ambulatory Visit: Payer: 59 | Admitting: Family Medicine

## 2019-05-25 ENCOUNTER — Other Ambulatory Visit: Payer: Self-pay

## 2019-05-25 VITALS — BP 122/80 | HR 72 | Temp 97.0°F | Resp 16 | Ht 62.0 in | Wt 143.0 lb

## 2019-05-25 DIAGNOSIS — G4733 Obstructive sleep apnea (adult) (pediatric): Secondary | ICD-10-CM

## 2019-05-25 DIAGNOSIS — E785 Hyperlipidemia, unspecified: Secondary | ICD-10-CM

## 2019-05-25 DIAGNOSIS — Z114 Encounter for screening for human immunodeficiency virus [HIV]: Secondary | ICD-10-CM

## 2019-05-25 DIAGNOSIS — Z0000 Encounter for general adult medical examination without abnormal findings: Secondary | ICD-10-CM | POA: Diagnosis not present

## 2019-05-25 DIAGNOSIS — Z130 Encounter for screening for diseases of the blood and blood-forming organs and certain disorders involving the immune mechanism: Secondary | ICD-10-CM

## 2019-05-25 DIAGNOSIS — Z1329 Encounter for screening for other suspected endocrine disorder: Secondary | ICD-10-CM

## 2019-05-25 DIAGNOSIS — R7303 Prediabetes: Secondary | ICD-10-CM | POA: Diagnosis not present

## 2019-05-25 DIAGNOSIS — E2839 Other primary ovarian failure: Secondary | ICD-10-CM

## 2019-05-25 DIAGNOSIS — J301 Allergic rhinitis due to pollen: Secondary | ICD-10-CM

## 2019-05-25 LAB — COMPREHENSIVE METABOLIC PANEL
ALT: 16 U/L (ref 0–35)
AST: 17 U/L (ref 0–37)
Albumin: 4.1 g/dL (ref 3.5–5.2)
Alkaline Phosphatase: 59 U/L (ref 39–117)
BUN: 15 mg/dL (ref 6–23)
CO2: 30 mEq/L (ref 19–32)
Calcium: 9.1 mg/dL (ref 8.4–10.5)
Chloride: 100 mEq/L (ref 96–112)
Creatinine, Ser: 0.87 mg/dL (ref 0.40–1.20)
GFR: 66.33 mL/min (ref >60.00)
Glucose, Bld: 90 mg/dL (ref 70–99)
Potassium: 4.2 mEq/L (ref 3.5–5.1)
Sodium: 139 mEq/L (ref 135–145)
Total Bilirubin: 0.5 mg/dL (ref 0.2–1.2)
Total Protein: 6.5 g/dL (ref 6.0–8.3)

## 2019-05-25 LAB — CBC
HCT: 41.6 % (ref 36.0–46.0)
Hemoglobin: 13.9 g/dL (ref 12.0–15.0)
MCHC: 33.5 g/dL (ref 30.0–36.0)
MCV: 88.2 fl (ref 78.0–100.0)
Platelets: 278 10*3/uL (ref 150.0–400.0)
RBC: 4.72 Mil/uL (ref 3.87–5.11)
RDW: 14.2 % (ref 11.5–15.5)
WBC: 5.7 10*3/uL (ref 4.0–10.5)

## 2019-05-25 LAB — LIPID PANEL
Cholesterol: 283 mg/dL — ABNORMAL HIGH (ref 0–200)
HDL: 69.7 mg/dL (ref >39.00)
LDL Cholesterol: 185 mg/dL — ABNORMAL HIGH (ref 0–99)
NonHDL: 213.74
Total CHOL/HDL Ratio: 4
Triglycerides: 143 mg/dL (ref 0.0–149.0)
VLDL: 28.6 mg/dL (ref 0.0–40.0)

## 2019-05-25 LAB — TSH: TSH: 3.23 u[IU]/mL (ref 0.35–4.50)

## 2019-05-25 LAB — HEMOGLOBIN A1C: Hgb A1c MFr Bld: 6.1 % (ref 4.6–6.5)

## 2019-05-25 MED ORDER — ROSUVASTATIN CALCIUM 20 MG PO TABS: ORAL_TABLET | ORAL | 3 refills | Status: DC

## 2019-05-25 MED ORDER — FLUTICASONE PROPIONATE 50 MCG/ACT NA SUSP: 1.0000 | Freq: Every day | NASAL | 11 refills | Status: DC

## 2019-05-26 LAB — HIV ANTIBODY (ROUTINE TESTING W REFLEX): HIV 1&2 Ab, 4th Generation: NONREACTIVE

## 2019-05-29 LAB — HM MAMMOGRAPHY

## 2019-06-01 ENCOUNTER — Encounter: Payer: Self-pay | Admitting: Family Medicine

## 2019-06-01 DIAGNOSIS — M8580 Other specified disorders of bone density and structure, unspecified site: Secondary | ICD-10-CM | POA: Insufficient documentation

## 2019-06-08 ENCOUNTER — Encounter: Payer: Self-pay | Admitting: Family Medicine

## 2019-06-11 ENCOUNTER — Institutional Professional Consult (permissible substitution): Payer: BC Managed Care – PPO | Admitting: Neurology

## 2019-06-18 ENCOUNTER — Encounter: Payer: Self-pay | Admitting: Neurology

## 2019-06-18 ENCOUNTER — Institutional Professional Consult (permissible substitution): Payer: Self-pay | Admitting: Neurology

## 2019-08-05 ENCOUNTER — Encounter: Payer: Self-pay | Admitting: Internal Medicine

## 2019-08-05 ENCOUNTER — Ambulatory Visit: Payer: 59 | Admitting: Internal Medicine

## 2019-08-05 ENCOUNTER — Telehealth: Payer: Self-pay

## 2019-08-05 ENCOUNTER — Other Ambulatory Visit: Payer: Self-pay

## 2019-08-05 VITALS — BP 120/72 | HR 81 | Temp 96.1°F | Resp 16 | Ht 62.0 in | Wt 152.0 lb

## 2019-08-05 DIAGNOSIS — Z23 Encounter for immunization: Secondary | ICD-10-CM

## 2019-08-05 DIAGNOSIS — T148XXA Other injury of unspecified body region, initial encounter: Secondary | ICD-10-CM | POA: Diagnosis not present

## 2019-08-05 MED ORDER — DOXYCYCLINE HYCLATE 100 MG PO TABS: 100.0000 mg | ORAL_TABLET | Freq: Two times a day (BID) | ORAL | 0 refills | Status: DC

## 2019-08-05 NOTE — Telephone Encounter (Signed)
Pt seen by Dr Larose Kells today

## 2019-08-05 NOTE — Telephone Encounter (Signed)
Copied from [place] [id number]. Topic: General - Other >> Aug 05, 2019  8:03 AM Keene Breath wrote: Reason for CRM: Patient called to schedule an appt. For today.  She jabbed her hand with a knife last night and hand is swollen.  She wants to know if she needs a tetanus shot and if she should come in to let the doctor look at it.  Tried the office, but no answer.  Please call patient back to schedule at [phone]

## 2019-08-05 NOTE — Patient Instructions (Signed)
Keep the area clean and dry  Use an antibiotic ointment twice a day  If you develop redness or swelling around the wound : start the antibiotic called doxycycline and notify the office.  If you have severe swelling, redness, fever, chills: Go to the ER
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
## 2019-08-05 NOTE — Telephone Encounter (Signed)
Patient up to date with tetanus shot. Should I get her in with another provider?

## 2019-08-05 NOTE — Progress Notes (Signed)
Pre visit review using our clinic review tool, if applicable. No additional management support is needed unless otherwise documented below in the visit note. 

## 2019-11-09 ENCOUNTER — Other Ambulatory Visit (INDEPENDENT_AMBULATORY_CARE_PROVIDER_SITE_OTHER): Payer: 59

## 2019-11-09 ENCOUNTER — Other Ambulatory Visit: Payer: Self-pay

## 2019-11-09 ENCOUNTER — Ambulatory Visit (INDEPENDENT_AMBULATORY_CARE_PROVIDER_SITE_OTHER): Payer: 59 | Admitting: Gastroenterology

## 2019-11-09 ENCOUNTER — Encounter: Payer: Self-pay | Admitting: Gastroenterology

## 2019-11-09 VITALS — BP 118/82 | HR 88 | Temp 97.6°F | Ht 62.0 in | Wt 150.0 lb

## 2019-11-09 DIAGNOSIS — R109 Unspecified abdominal pain: Secondary | ICD-10-CM

## 2019-11-09 DIAGNOSIS — K219 Gastro-esophageal reflux disease without esophagitis: Secondary | ICD-10-CM

## 2019-11-09 DIAGNOSIS — R140 Abdominal distension (gaseous): Secondary | ICD-10-CM

## 2019-11-09 DIAGNOSIS — K582 Mixed irritable bowel syndrome: Secondary | ICD-10-CM | POA: Diagnosis not present

## 2019-11-09 LAB — COMPREHENSIVE METABOLIC PANEL
ALT: 20 U/L (ref 0–35)
AST: 20 U/L (ref 0–37)
Albumin: 4.3 g/dL (ref 3.5–5.2)
Alkaline Phosphatase: 54 U/L (ref 39–117)
BUN: 9 mg/dL (ref 6–23)
CO2: 30 mEq/L (ref 19–32)
Calcium: 9.6 mg/dL (ref 8.4–10.5)
Chloride: 100 mEq/L (ref 96–112)
Creatinine, Ser: 0.98 mg/dL (ref 0.40–1.20)
GFR: 57.73 mL/min — ABNORMAL LOW (ref >60.00)
Glucose, Bld: 101 mg/dL — ABNORMAL HIGH (ref 70–99)
Potassium: 4 mEq/L (ref 3.5–5.1)
Sodium: 137 mEq/L (ref 135–145)
Total Bilirubin: 0.4 mg/dL (ref 0.2–1.2)
Total Protein: 7.4 g/dL (ref 6.0–8.3)

## 2019-11-09 LAB — CBC WITH DIFFERENTIAL/PLATELET
Basophils Absolute: 0.1 10*3/uL (ref 0.0–0.1)
Basophils Relative: 1.1 % (ref 0.0–3.0)
Eosinophils Absolute: 0.3 10*3/uL (ref 0.0–0.7)
Eosinophils Relative: 4.2 % (ref 0.0–5.0)
HCT: 40.8 % (ref 36.0–46.0)
Hemoglobin: 13.8 g/dL (ref 12.0–15.0)
Lymphocytes Relative: 24.9 % (ref 12.0–46.0)
Lymphs Abs: 1.6 10*3/uL (ref 0.7–4.0)
MCHC: 33.9 g/dL (ref 30.0–36.0)
MCV: 88.3 fl (ref 78.0–100.0)
Monocytes Absolute: 0.6 10*3/uL (ref 0.1–1.0)
Monocytes Relative: 9.9 % (ref 3.0–12.0)
Neutro Abs: 3.9 10*3/uL (ref 1.4–7.7)
Neutrophils Relative %: 59.9 % (ref 43.0–77.0)
Platelets: 277 10*3/uL (ref 150.0–400.0)
RBC: 4.62 Mil/uL (ref 3.87–5.11)
RDW: 13.3 % (ref 11.5–15.5)
WBC: 6.6 10*3/uL (ref 4.0–10.5)

## 2019-11-09 MED ORDER — LUBIPROSTONE 8 MCG PO CAPS: 8.0000 ug | ORAL_CAPSULE | Freq: Two times a day (BID) | ORAL | 1 refills | Status: DC
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
## 2019-11-09 NOTE — Patient Instructions (Signed)
Go to the basement for labs today  We have sent in Amitiza to your pharmacy  Follow up in 3 months   Dr  Silverio Decamp recommends that you complete a bowel purge (to clean out your bowels). Please do the following: Purchase a bottle of Miralax over the counter as well as a box of 5 mg dulcolax tablets. Take 4 dulcolax tablets. Wait 1 hour. You will then drink 6-8 capfuls of Miralax mixed in an adequate amount of water/juice/gatorade (you may choose which of these liquids to drink) over the next 2-3 hours. You should expect results within 1 to 6 hours after completing the bowel purge.  Take FD Donald Prose and IB Gard 1 capsule three times a day as needed  Increase dietary fiber and fluid intake  If you are age 19 or older, your body mass index should be between 23-30. Your Body mass index is 27.44 kg/m. If this is out of the aforementioned range listed, please consider follow up with your Primary Care Provider.  If you are age 11 or younger, your body mass index should be between 19-25. Your Body mass index is 27.44 kg/m. If this is out of the aformentioned range listed, please consider follow up with your Primary Care Provider.    I appreciate the  opportunity to care for you  Thank You   Harl Bowie , MD

## 2019-11-12 ENCOUNTER — Encounter: Payer: Self-pay | Admitting: Gastroenterology

## 2019-12-19 ENCOUNTER — Ambulatory Visit: Payer: 59 | Attending: Internal Medicine

## 2019-12-19 DIAGNOSIS — Z23 Encounter for immunization: Secondary | ICD-10-CM

## 2019-12-19 NOTE — Progress Notes (Signed)
   Covid-19 Vaccination Clinic  Name:  Jamie Levy    MRN: [id number] DOB: 05/21/1961  12/19/2019  [name] was observed post Covid-19 immunization for 15 minutes without incident. She was provided with Vaccine Information Sheet and instruction to access the V-Safe system.   [name] was instructed to call 911 with any severe reactions post vaccine: Marland Kitchen Difficulty breathing  . Swelling of face and throat  . A fast heartbeat  . A bad rash all over body  . Dizziness and weakness   Immunizations Administered    Name Date Dose VIS Date Route   Pfizer COVID-19 Vaccine 12/19/2019  9:03 AM 0.3 mL 08/21/2019 Intramuscular   Manufacturer: [place]   Lot: [id number]   [place]: [id number]

## 2020-01-12 ENCOUNTER — Other Ambulatory Visit: Payer: Self-pay | Admitting: Gastroenterology

## 2020-01-12 ENCOUNTER — Ambulatory Visit: Payer: 59 | Attending: Internal Medicine

## 2020-01-12 DIAGNOSIS — Z23 Encounter for immunization: Secondary | ICD-10-CM

## 2020-01-12 NOTE — Progress Notes (Signed)
   Covid-19 Vaccination Clinic  Name:  Jamie Levy    MRN: [id number] DOB: [birth year]  01/12/2020  Jamie Levy was observed post Covid-19 immunization for 15 minutes without incident. She was provided with Vaccine Information Sheet and instruction to access the V-Safe system.   Jamie Levy was instructed to call 911 with any severe reactions post vaccine: Marland Kitchen Difficulty breathing  . Swelling of face and throat  . A fast heartbeat  . A bad rash all over body  . Dizziness and weakness   Immunizations Administered    Name Date Dose VIS Date Route   Pfizer COVID-19 Vaccine 01/12/2020 10:21 AM 0.3 mL 11/04/2018 Intramuscular   Manufacturer: [place]   Lot: [id number]   [place]: [id number]

## 2020-02-06 ENCOUNTER — Other Ambulatory Visit: Payer: Self-pay | Admitting: Gastroenterology

## 2020-02-17 ENCOUNTER — Other Ambulatory Visit: Payer: Self-pay

## 2020-02-17 MED ORDER — LUBIPROSTONE 8 MCG PO CAPS: 8.0000 ug | ORAL_CAPSULE | Freq: Two times a day (BID) | ORAL | 0 refills | Status: DC

## 2020-02-17 NOTE — Telephone Encounter (Signed)
Patient's insurance requires Amitiza be a 90 day supply.

## 2020-02-19 ENCOUNTER — Other Ambulatory Visit: Payer: Self-pay

## 2020-02-19 MED ORDER — LUBIPROSTONE 8 MCG PO CAPS: 8.0000 ug | ORAL_CAPSULE | Freq: Two times a day (BID) | ORAL | 0 refills | Status: DC

## 2020-05-21 DIAGNOSIS — R7303 Prediabetes: Secondary | ICD-10-CM | POA: Insufficient documentation

## 2020-05-21 NOTE — Progress Notes (Addendum)
[facility] [address] [place], [place] [address] [phone]  Date:  05/23/2020   Name:  Jamie Levy   DOB:  1960-11-26   MRN:  [id number]  PCP:  Darreld Mclean, MD    Chief Complaint: Irritable Bowel Syndrome (second opinion, saw Dr. Maurene Capes) and Weight Gain   History of Present Illness:  Jamie Levy is a 59 y.o. very pleasant female patient who presents with the following:  Pt here today for a recheck visit- History of sleep apnea, IBS, dyslipidemia, depression, skin cancer, prediabetes  Last seen by myself about one year ago  Today, patient notes concern of IBS- she has=d been seeing Dr Olevia Perches until her retirement, but is not as happy with her current provider.  She would like to get a 2nd opinion perhaps She was taking Amitizia but it did not help so she stopped using it Constipation is a big issue for her - diarrhea is less frequently a problem for her  She may sometimes go several days without a bowel movement which may very uncomfortable  She stopped drinking wine and has lost some weight!    Wt Readings from Last 3 Encounters:  05/23/20 143 lb (64.9 kg)  11/09/19 150 lb (68 kg)  08/05/19 152 lb (68.9 kg)    She notes a pain starting in her posterior RIGHT shoulder that can radiate into her right arm No CP or SOB NKI The pain seems to be positional, she feels like it is in her trapezius muscle  Flu vaccine- pt declines  Colon due next year mammo- she is due, will schedule this  covid UTD shingrix - pt declines for now  Most recent labs - CBC and CMP in March of this year  She is retired from her work in the school system, she is now running an Geophysicist/field seismologist business through [employer] and is having considerable success, she is really enjoying this new chapter  Patient Active Problem List   Diagnosis Date Noted  . Prediabetes 05/21/2020  . Osteopenia 06/01/2019  . 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 Facial rash 05/20/2012  . Weight loss, abnormal 07/09/2011  . Skin cancer 02/16/2011  . CONSTIPATION 12/02/2009  . Flatulence, eructation, and gas pain 12/02/2009  . ABDOMINAL PAIN RIGHT LOWER QUADRANT 12/02/2009  . SLEEP APNEA 04/27/2009  . IRRITABLE BOWEL SYNDROME 03/28/2009  . RECTAL BLEEDING 03/15/2009  . Dyslipidemia 03/11/2009  . DEPRESSION 03/11/2009  . SINUSITIS, CHRONIC 03/11/2009  . Diverticulosis of colon (without mention of hemorrhage) 03/11/2009  . Abdominal pain, left lower quadrant 03/11/2009    Past Medical History:  Diagnosis Date  . Abdominal pain, left lower quadrant   . Allergy   . Anxiety   . Depressive disorder, not elsewhere classified   . Diarrhea of presumed infectious origin   . Diverticulosis of colon (without mention of hemorrhage)   . Facial ringworm May '14  . Irritable bowel syndrome   . Other and unspecified hyperlipidemia   . Sleep apnea   . Unspecified sinusitis (chronic)   . Unspecified sleep apnea     Past Surgical History:  Procedure Laterality Date  . ABDOMINAL HYSTERECTOMY  1997   BSo- Fibroids  . COLONOSCOPY    . elbow cystectomy    . NASAL SINUS SURGERY     x 7 most recent June '14    Social History   Tobacco Use  . Smoking  status: Never Smoker  . Smokeless tobacco: Never Used  Vaping Use  . Vaping Use: Never used  Substance Use Topics  . Alcohol use: Yes    Alcohol/week: 3.0 standard drinks    Types: 3 Standard drinks or equivalent per week    Comment: very rare, avoids alcohol when depressed  . Drug use: No    Family History  Problem Relation Age of Onset  . Ovarian cancer Mother   . Prostate cancer Father   . Colon polyps Father   . Heart disease Father        CAD/CABG/Stents  . Melanoma Father   . Depression Father        suicidal in the past  . Mental illness Father   . Bladder Cancer Father   . Colon cancer Neg Hx   . Stomach cancer Neg Hx   . Esophageal cancer Neg Hx      Allergies  Allergen Reactions  . Compazine [Prochlorperazine Maleate] Anaphylaxis    Stroke like symptoms   . Iodinated Diagnostic Agents Hives  . Iohexol Hives and Other (See Comments)     Code: HIVES, Desc: pt developed one hive on rt upper arm and itching on upper lip after receiving 15ml omni 300, needs to be pre medicated, Onset Date: [id number] 05-17-2014 Patient able to tolerate water soluble PO contrast w/o reaction. rsm MUST PRE-MEDICATE WITH BENADRYL  . Codeine Nausea And Vomiting  . Buprenorphine Hcl Nausea And Vomiting     give benadryl IV side effects lighten  . Morphine And Related Nausea And Vomiting  . Sulfamethoxazole-Trimethoprim Hives and Other (See Comments)    Makes her anxious & keeps her up all night    Medication list has been reviewed and updated.  Current Outpatient Medications on File Prior to Visit  Medication Sig Dispense Refill  . aspirin 81 MG tablet Take 81 mg by mouth daily.    Marland Kitchen buPROPion (WELLBUTRIN XL) 300 MG 24 hr tablet Take 300 mg by mouth daily.    . cetirizine (ZYRTEC) 10 MG tablet Take 10 mg by mouth daily.    . fluticasone (FLONASE) 50 MCG/ACT nasal spray Place 1 spray into both nostrils daily. 16 g 11  . rosuvastatin (CRESTOR) 20 MG tablet TAKE 1 TABLET BY MOUTH EVERY DAY 90 tablet 3  . traZODone (DESYREL) 50 MG tablet take 1 to 2 tablets by mouth once daily  0  . Venlafaxine HCl 225 MG TB24 Take 1 tablet by mouth daily.     Marland Kitchen lubiprostone (AMITIZA) 8 MCG capsule Take 1 capsule (8 mcg total) by mouth 2 (two) times daily with a meal. 180 capsule 0   No current facility-administered medications on file prior to visit.    Review of Systems:  As per HPI- otherwise negative.   Physical Examination: Vitals:   05/23/20 1356  BP: 112/70  Pulse: 89  Resp: 16  SpO2: 95%   Vitals:   05/23/20 1356  Weight: 143 lb (64.9 kg)  Height: 5\' 2"  (1.575 m)   Body mass index is 26.16 kg/m. Ideal Body Weight: Weight in (lb) to have BMI =  25: 136.4  GEN: no acute distress.  Minimal overweight, looks well HEENT: Atraumatic, Normocephalic.  Ears and Nose: No external deformity. CV: RRR, No M/G/R. No JVD. No thrill. No extra heart sounds. PULM: CTA B, no wheezes, crackles, rhonchi. No retractions. No resp. distress. No accessory muscle use. ABD: S, NT, ND EXTR: No c/c/e PSYCH: Normally interactive. Conversant.  Patient  has tenderness and muscle spasm in the trapezius muscle over the right scapula.  Normal range of motion and strength of bilateral upper extremities.  Rotator cuff testing on the right is benign No redness or lesion of the skin  Assessment and Plan: Dyslipidemia - Plan: Lipid panel  Prediabetes - Plan: Hemoglobin A1c  Screening for deficiency anemia  Screening for thyroid disorder - Plan: TSH  Irritable bowel syndrome, unspecified type - Plan: Basic metabolic panel  Irritable bowel syndrome with constipation - Plan: Ambulatory referral to Gastroenterology  Cold sore - Plan: valACYclovir (VALTREX) 1000 MG tablet  Muscle spasm - Plan: methocarbamol (ROBAXIN) 500 MG tablet  Patient today for a follow-up visit Labs are pending as above She would like referral to another gastroenterologist for an opinion, will refer her to see Dr. Collene Mares Refilled her Valtrex that she uses as needed for cold sores Suggested heat, massage, may also try Robaxin for muscle spasm in her shoulder.  Cautioned that this may cause sedation-if used with trazodone, decrease dose of trazodone 50% Will plan further follow- up pending labs.  Patient is concerned about her weight, but she was encouraged to learn she has actually lost almost 10 pounds over the last year.  I encouraged her to keep up her efforts This visit occurred during the SARS-CoV-2 public health emergency.  Safety protocols were in place, including screening questions prior to the visit, additional usage of staff PPE, and extensive cleaning of exam room while observing  appropriate contact time as indicated for disinfecting solutions.    Signed Lamar Blinks, MD  Addendum 9/14, received her labs as below Call patient to discuss thyroid abnormality as MyChart not set up Called pt on 9/15-no answer, left message on machine.  TSH is slightly elevated.  Would suggest either start on thyroid replacement, or can recheck TSH in a few weeks first since elevation is mild.  Please let me know which you prefer to do Will send letter to patient Results for orders placed or performed in visit on 16/10/96  Basic metabolic panel  Result Value Ref Range   Glucose, Bld 87 65 - 99 mg/dL   BUN 15 7 - 25 mg/dL   Creat 0.83 0.50 - 0.99 mg/dL   BUN/Creatinine Ratio NOT APPLICABLE 6 - 22 (calc)   Sodium 136 135 - 146 mmol/L   Potassium 4.4 3.5 - 5.3 mmol/L   Chloride 99 98 - 110 mmol/L   CO2 26 20 - 32 mmol/L   Calcium 9.5 8.6 - 10.4 mg/dL  Hemoglobin A1c  Result Value Ref Range   Hgb A1c MFr Bld 5.6 <5.7 % of total Hgb   Mean Plasma Glucose 114 (calc)   eAG (mmol/L) 6.3 (calc)  Lipid panel  Result Value Ref Range   Cholesterol 237 (H) <200 mg/dL   HDL 67 > OR = 50 mg/dL   Triglycerides 130 <150 mg/dL   LDL Cholesterol (Calc) 145 (H) mg/dL (calc)   Total CHOL/HDL Ratio 3.5 <5.0 (calc)   Non-HDL Cholesterol (Calc) 170 (H) <130 mg/dL (calc)  TSH  Result Value Ref Range   TSH 4.87 (H) 0.40 - 4.50 mIU/L    The 10-year ASCVD risk score Mikey Bussing DC Jr., et al., 2013) is: 2.8%   Values used to calculate the score:     Age: 65 years     Sex: Female     Is Non-Hispanic African American: No     Diabetic: No     Tobacco smoker: No  Systolic Blood Pressure: 006 mmHg     Is BP treated: No     HDL Cholesterol: 67 mg/dL     Total Cholesterol: 237 mg/dL

## 2020-05-21 NOTE — Patient Instructions (Addendum)
Good to see you again today-  I will be in touch with your labs asap  We will set you up to see Dr Collene Mares for a GI consultation Please think about getting the shingles vaccine (shingrix) and a flu shot mammo is due Let me know if the muscle spasm in your shoulder is not better soon Please see me in about 6 months

## 2020-05-23 ENCOUNTER — Encounter: Payer: Self-pay | Admitting: Family Medicine

## 2020-05-23 ENCOUNTER — Other Ambulatory Visit: Payer: Self-pay

## 2020-05-23 ENCOUNTER — Ambulatory Visit: Payer: 59 | Admitting: Family Medicine

## 2020-05-23 VITALS — BP 112/70 | HR 89 | Resp 16 | Ht 62.0 in | Wt 143.0 lb

## 2020-05-23 DIAGNOSIS — R7303 Prediabetes: Secondary | ICD-10-CM

## 2020-05-23 DIAGNOSIS — Z1329 Encounter for screening for other suspected endocrine disorder: Secondary | ICD-10-CM

## 2020-05-23 DIAGNOSIS — E785 Hyperlipidemia, unspecified: Secondary | ICD-10-CM | POA: Diagnosis not present

## 2020-05-23 DIAGNOSIS — K581 Irritable bowel syndrome with constipation: Secondary | ICD-10-CM

## 2020-05-23 DIAGNOSIS — Z130 Encounter for screening for diseases of the blood and blood-forming organs and certain disorders involving the immune mechanism: Secondary | ICD-10-CM

## 2020-05-23 DIAGNOSIS — B001 Herpesviral vesicular dermatitis: Secondary | ICD-10-CM

## 2020-05-23 DIAGNOSIS — K589 Irritable bowel syndrome without diarrhea: Secondary | ICD-10-CM

## 2020-05-23 DIAGNOSIS — M62838 Other muscle spasm: Secondary | ICD-10-CM

## 2020-05-23 MED ORDER — METHOCARBAMOL 500 MG PO TABS: 500.0000 mg | ORAL_TABLET | Freq: Three times a day (TID) | ORAL | 0 refills | Status: DC | PRN

## 2020-05-23 MED ORDER — VALACYCLOVIR HCL 1 G PO TABS: 1000.0000 mg | ORAL_TABLET | Freq: Two times a day (BID) | ORAL | 3 refills | Status: DC | PRN

## 2020-05-24 ENCOUNTER — Telehealth: Payer: Self-pay | Admitting: Family Medicine

## 2020-05-24 LAB — HEMOGLOBIN A1C
Hgb A1c MFr Bld: 5.6 % of total Hgb (ref <5.7)
Mean Plasma Glucose: 114 (calc)
eAG (mmol/L): 6.3 (calc)

## 2020-05-24 LAB — BASIC METABOLIC PANEL
BUN: 15 mg/dL (ref 7–25)
CO2: 26 mmol/L (ref 20–32)
Calcium: 9.5 mg/dL (ref 8.6–10.4)
Chloride: 99 mmol/L (ref 98–110)
Creat: 0.83 mg/dL (ref 0.50–0.99)
Glucose, Bld: 87 mg/dL (ref 65–99)
Potassium: 4.4 mmol/L (ref 3.5–5.3)
Sodium: 136 mmol/L (ref 135–146)

## 2020-05-24 LAB — LIPID PANEL
Cholesterol: 237 mg/dL — ABNORMAL HIGH (ref <200)
HDL: 67 mg/dL (ref >=50)
LDL Cholesterol (Calc): 145 mg/dL (calc) — ABNORMAL HIGH
Non-HDL Cholesterol (Calc): 170 mg/dL (calc) — ABNORMAL HIGH (ref <130)
Total CHOL/HDL Ratio: 3.5 (calc) (ref <5.0)
Triglycerides: 130 mg/dL (ref <150)

## 2020-05-24 LAB — TSH: TSH: 4.87 mIU/L — ABNORMAL HIGH (ref 0.40–4.50)

## 2020-05-24 NOTE — Telephone Encounter (Signed)
Called [facility]- pt has self- requested bilateral breast diag mammo and [place] due to her dense breasts.  This is fine with me, however I am not sure if her insurance will cover.  Per [facility] they will look into this for pt.  I will fax an order for pt    Fax [phone]

## 2020-05-24 NOTE — Telephone Encounter (Signed)
CallerShaunda Tipping  Call Back : [phone]  Patient requesting a order place /sent to [facility] . Patient is requesting a breast ultrasound and diagnotic testing due to the density of her breast.   [place] in: [facility] Address: [address] [place], [place], [place] [address] Hours:  Open ? Closes 5PM Phone: [phone]

## 2020-06-02 ENCOUNTER — Other Ambulatory Visit: Payer: Self-pay | Admitting: Family Medicine

## 2020-06-02 DIAGNOSIS — E785 Hyperlipidemia, unspecified: Secondary | ICD-10-CM

## 2020-06-06 ENCOUNTER — Encounter: Payer: 59 | Admitting: Family Medicine

## 2020-06-17 ENCOUNTER — Telehealth: Payer: Self-pay | Admitting: *Deleted

## 2020-06-17 DIAGNOSIS — R7989 Other specified abnormal findings of blood chemistry: Secondary | ICD-10-CM

## 2020-06-17 NOTE — Telephone Encounter (Signed)
Pt has lab appt for Wednesday for Tsh per lab schedule but there are no future orders in Epic.  Please place orders if appropriate.

## 2020-06-20 NOTE — Telephone Encounter (Signed)
Order placed

## 2020-06-23 ENCOUNTER — Other Ambulatory Visit: Payer: 59

## 2020-06-30 ENCOUNTER — Other Ambulatory Visit: Payer: 59

## 2020-07-11 ENCOUNTER — Telehealth: Payer: Self-pay | Admitting: Family Medicine

## 2020-07-11 DIAGNOSIS — K581 Irritable bowel syndrome with constipation: Secondary | ICD-10-CM

## 2020-07-11 NOTE — Telephone Encounter (Signed)
Discussed with patient, place GI referral with [facility]

## 2020-08-24 ENCOUNTER — Other Ambulatory Visit: Payer: Self-pay | Admitting: Family Medicine

## 2020-08-24 DIAGNOSIS — B001 Herpesviral vesicular dermatitis: Secondary | ICD-10-CM

## 2020-09-22 ENCOUNTER — Other Ambulatory Visit: Payer: Self-pay

## 2020-09-22 ENCOUNTER — Telehealth (INDEPENDENT_AMBULATORY_CARE_PROVIDER_SITE_OTHER): Payer: 59 | Admitting: Medical

## 2020-09-22 ENCOUNTER — Telehealth: Payer: Self-pay | Admitting: Medical

## 2020-09-22 DIAGNOSIS — R0989 Other specified symptoms and signs involving the circulatory and respiratory systems: Secondary | ICD-10-CM | POA: Diagnosis not present

## 2020-09-22 DIAGNOSIS — R509 Fever, unspecified: Secondary | ICD-10-CM

## 2020-09-22 DIAGNOSIS — R002 Palpitations: Secondary | ICD-10-CM

## 2020-09-22 DIAGNOSIS — R059 Cough, unspecified: Secondary | ICD-10-CM

## 2020-09-22 MED ORDER — BENZONATATE 100 MG PO CAPS: 100.0000 mg | ORAL_CAPSULE | Freq: Three times a day (TID) | ORAL | 0 refills | Status: DC | PRN

## 2020-09-22 MED ORDER — FLUTICASONE PROPIONATE 50 MCG/ACT NA SUSP: 2.0000 | Freq: Every day | NASAL | 1 refills | Status: DC

## 2020-09-22 MED ORDER — AZITHROMYCIN 250 MG PO TABS: ORAL_TABLET | ORAL | 0 refills | Status: DC

## 2020-09-22 NOTE — Patient Instructions (Addendum)
Recent onset of viral syndrome in pt who is vaccinated against covid.   Recommend rest, hydrate and take tylenol for fever. Ibuprofen other option as well.  For cough and nasal congestion prescribed benzonatate and flonase.  If chest congestion worsens as discussed then can start azithromycin. Rx sent to pharmacy.  Discussed recent tachycardia sensation. Explained this can happen with fever so important to keep try to prevent.   Pt expresses fear at prospect of having to go to ED particularly if get tachycardia. Explained 02 sat monitor easy way to check pulse.   Possible to get ekg in office late in day if needed. Not indicated presently. Explained office protocols. Pt has outstanding covid pcr that will take 5 days.  Follow up 7 days or as needed

## 2020-09-22 NOTE — Progress Notes (Addendum)
Subjective:    Patient ID: Jamie Levy, female    DOB: 24-Aug-1961, 60 y.o.   MRN: [id number]  HPI  Virtual Visit via Telephone Note  I connected with Jamie Levy on 09/22/20 at  8:40 AM EST by telephone and verified that I am speaking with the correct person using two identifiers.    I discussed the limitations, risks, security and privacy concerns of performing an evaluation and management service by telephone and the availability of in person appointments. I also discussed with the patient that there may be a patient responsible charge related to this service. The patient expressed understanding and agreed to proceed.    Pt could see me but her camera was not operating??   Location: Patient: home Provider: office.  Participants- pt and myself.     History of Present Illness: Pt states she has had vaccines but not booster.  Pt back in December mild cough. Got better.  Then on January 11 th got fever, ha, chest congestion and tachycardia/possible palpitation sensation   She felt feverish at that time of fast heart rate. She does not have cardiac symptoms  today. She expresses fear about her illness. Fever is still 100.   Pt feels tired and chest congestion. Pt is coughing up mucus.   Pt has been tested for covid. Results are pending.Swabbed yesterday. Told may take 5 days to get the results.      Observations/Objective:  General-no acute distress, pleasant, oriented. Lungs- no labored speech. Occasion cough.    Assessment and Plan: Recent onset of viral syndrome in pt who is vaccinated against covid.   Recommend rest, hydrate and take tylenol for fever. Ibuprofen other option as well.  For cough and nasal congestion prescribed benzonatate and flonase.  If chest congestion worsens as discussed then can start azithromycin. Rx sent to pharmacy.  Discussed recent tachycardia sensation. Explained this can happen with fever so important to keep try to  prevent.   Expresses fear at prospect of having to go to ED particularly if get tachycardia which caused her concern Explained 02 sat monitor easy way to check pulse.   Possible to get ekg in office late in day if needed. Not indicated presently. Explained office protocols. Pt has outstanding covid pcr that will take 5 days.  Follow up 7 days or as needed  Mackie Pai, PA-C   Time spent with patient today was 30 minutes which consisted of chart revdiew, discussing diagnosis, work up treatment and documentation.  Follow Up Instructions:    I discussed the assessment and treatment plan with the patient. The patient was provided an opportunity to ask questions and all were answered. The patient agreed with the plan and demonstrated an understanding of the instructions.   The patient was advised to call back or seek an in-person evaluation if the symptoms worsen or if the condition fails to improve as anticipated.     Mackie Pai, PA-C    Review of Systems  Constitutional: Positive for fatigue. Negative for chills and fever.  HENT: Positive for congestion.   Respiratory: Positive for cough. Negative for shortness of breath and wheezing.   Cardiovascular: Negative for chest pain and palpitations.       No palpitation presently.  Gastrointestinal: Negative for abdominal pain.  Musculoskeletal: Negative for back pain.  Hematological: Negative for adenopathy. Does not bruise/bleed easily.  Psychiatric/Behavioral: Negative for behavioral problems and confusion.       Objective:   Physical Exam  Assessment & Plan:

## 2020-09-22 NOTE — Telephone Encounter (Signed)
Fyi. Pt seen today. Possible covid. pcr test pending. She had fast hr yesterday when had fever. Advise to control fever and would reduce change fast hr.   If reoccurs she may want to be seen for ekg.   I am not in office this afternoon. And not here tomorrow. May need ekg end of the day when we see potential covid pt if needed. Very anxious and does not want to to go ED in event of those symptoms.

## 2020-09-22 NOTE — Addendum Note (Signed)
Addended by: Anabel Halon on: 09/22/2020 02:52 PM   Modules accepted: Level of Service

## 2020-09-26 ENCOUNTER — Telehealth: Payer: Self-pay

## 2020-09-26 NOTE — Telephone Encounter (Signed)
Attempted to contact patient, no answer    [facility] Night - Client TELEPHONE ADVICE RECORD [facility] Patient Name: Jamie Levy Gender: Female DOB: 10/10/1960 Age: 60 Y 38 M 15 D Return Phone Number: [phone] (Primary) Address: City/State/Zip: [place] [address] Client [facility] Night - Client Client Site [facility] - Night Physician Copland, Janett Billow- MD Contact Type Call Who Is Calling Patient / Member / Family / Caregiver Call Type Triage / Clinical Relationship To Patient Self Return Phone Number [phone] (Primary) Chief Complaint Anxiety and Panic Attack Reason for Call Symptomatic / Request for [name] states she has Covid and she has anxiety and severe depression now. Caller states she has asked her family to stay home she is does not want to stay alone and she has been crying on and off for days. Caller states she has never felt like this before and she says her Covid has subsided. Additional Comment per charge to make this chart urgent!! Translation No Nurse Assessment Nurse: Ysidro Evert, RN, Levada Dy Date/Time Eilene Ghazi Time): 09/26/2020 7:19:09 AM Confirm and document reason for call. If symptomatic, describe symptoms. ---Caller states he is having anxiety about Covid even though her physical symptoms are better. She states she has a lot of anxiety and is unable to focus on any thing and been crying a lot Does the patient have any new or worsening symptoms? ---Yes Will a triage be completed? ---Yes Related visit to physician within the last 2 weeks? ---No Does the PT have any chronic conditions? (i.e. diabetes, asthma, this includes High risk factors for pregnancy, etc.) ---Yes List chronic conditions. ---anxiety, depression Is this a behavioral health or substance abuse call? ---No Guidelines Guideline Title Affirmed Question Affirmed Notes Nurse Date/Time  (Eastern Time) Anxiety and Panic Attack [1] Lightheadedness or dizziness AND [2] persists > 10 minutes AND [3] not relieved by reassurance provided by triager Ysidro Evert, RN, Levada Dy 09/26/2020 7:21:07 AM PLEASE NOTE: All timestamps contained within this report are represented as [place] Standard Time. CONFIDENTIALTY NOTICE: This fax transmission is intended only for the addressee. It contains information that is legally privileged, confidential or otherwise protected from use or disclosure. If you are not the intended recipient, you are strictly prohibited from reviewing, disclosing, copying using or disseminating any of this information or taking any action in reliance on or regarding this information. If you have received this fax in error, please notify us immediately by telephone so that we can arrange for its return to [place]. Phone: [phone], Toll-Free: [phone], Fax: [phone] Page: 2 of 2 Call Id: [id number] [place]. Time Eilene Ghazi Time) Disposition Final User 09/26/2020 7:17:46 AM Send to Urgent Queue Josephine Cables 09/26/2020 7:26:24 AM Go to ED Now Yes Ysidro Evert, RN, Marin Shutter Disagree/Comply Comply Caller Understands Yes PreDisposition Did not know what to do Care Advice Given Per Guideline GO TO ED NOW: * You need to be seen in the Emergency Department. * Go to the ED at ___________ [place] now. Drive carefully. * It is better and safer if another adult drives instead of you. CARE ADVICE given per Anxiety and Panic Attack (Adult) guideline. Referrals GO TO FACILITY UNDECIDED

## 2020-09-26 NOTE — Telephone Encounter (Signed)
Called her- no answer.  LMOM- I am sorry she is having such a hard time.  She does see psychiatry- her last visit with them was earlier this month.  Asked her to please contact her psychiatrist for help with her depression and anxiety, but also to let me know if there is something more we can do for her

## 2020-09-27 NOTE — Progress Notes (Addendum)
[facility] [address], [place], [place] [address] [phone]  Date:  09/28/2020   Name:  Jamie Levy   DOB:  [birth year]   MRN:  [id number]  PCP:  Darreld Mclean, MD    Chief Complaint: No chief complaint on file.   History of Present Illness:  Jamie Levy is a 60 y.o. very pleasant female patient who presents with the following:  Virtual visit today for concern of possible COVID-19 symptoms Telephone visit only as pt could not access her mychart video  Patient location is home, provider location is office.  Patient identity confirmed with two factors, she gives consent for virtual visit today The patient and myself are present on the visit today  Can had a video visit with Percell Miller on January 13 with concerns of low-grade temperature, fatigue, chest congestion, cough, headache.She was given azithromycin  She then called in on January 17 stating she had been diagnosed with COVID and was suffering from severe anxiety and depression.  Both myself and our nursing supervisor attempted to contact the patient but could not reach her.  I did leave her a detailed message She has not actually gotten her covid results back but states that she knows she has covid due to her current symptoms  She also notes that she was having a lot of crying, feeling very anxious and upset.  "I would wake up sobbing and crying, my dreams were crazy and scary"  She ended up stopping her zpack early as she thought it might be causing her psychologic sx and this did help- she felt a lot better as far as her emotional sx within about 2 days   Of note, she does have active psychiatry care with Dr. Marian Sorrow at [facility], most recent visit was January 5  Patient Active Problem List   Diagnosis Date Noted  . Prediabetes 05/21/2020  . Osteopenia 06/01/2019  . 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 Facial rash 05/20/2012   . Weight loss, abnormal 07/09/2011  . Skin cancer 02/16/2011  . CONSTIPATION 12/02/2009  . Flatulence, eructation, and gas pain 12/02/2009  . ABDOMINAL PAIN RIGHT LOWER QUADRANT 12/02/2009  . SLEEP APNEA 04/27/2009  . IRRITABLE BOWEL SYNDROME 03/28/2009  . RECTAL BLEEDING 03/15/2009  . Dyslipidemia 03/11/2009  . DEPRESSION 03/11/2009  . SINUSITIS, CHRONIC 03/11/2009  . Diverticulosis of colon (without mention of hemorrhage) 03/11/2009  . Abdominal pain, left lower quadrant 03/11/2009    Past Medical History:  Diagnosis Date  . Abdominal pain, left lower quadrant   . Allergy   . Anxiety   . Depressive disorder, not elsewhere classified   . Diarrhea of presumed infectious origin   . Diverticulosis of colon (without mention of hemorrhage)   . Facial ringworm May '14  . Irritable bowel syndrome   . Other and unspecified hyperlipidemia   . Sleep apnea   . Unspecified sinusitis (chronic)   . Unspecified sleep apnea     Past Surgical History:  Procedure Laterality Date  . ABDOMINAL HYSTERECTOMY  1997   BSo- Fibroids  . COLONOSCOPY    . elbow cystectomy    . NASAL SINUS SURGERY     x 7 most recent June '14    Social History   Tobacco Use  . Smoking status: Never Smoker  . Smokeless tobacco: Never Used  Vaping Use  . Vaping Use: Never used  Substance Use Topics  .  Alcohol use: Yes    Alcohol/week: 3.0 standard drinks    Types: 3 Standard drinks or equivalent per week    Comment: very rare, avoids alcohol when depressed  . Drug use: No    Family History  Problem Relation Age of Onset  . Ovarian cancer Mother   . Prostate cancer Father   . Colon polyps Father   . Heart disease Father        CAD/CABG/Stents  . Melanoma Father   . Depression Father        suicidal in the past  . Mental illness Father   . Bladder Cancer Father   . Colon cancer Neg Hx   . Stomach cancer Neg Hx   . Esophageal cancer Neg Hx     Allergies  Allergen Reactions  . Compazine  [Prochlorperazine Maleate] Anaphylaxis    Stroke like symptoms   . Iodinated Diagnostic Agents Hives  . Iohexol Hives and Other (See Comments)     Code: HIVES, Desc: pt developed one hive on rt upper arm and itching on upper lip after receiving 123ml omni 300, needs to be pre medicated, Onset Date: [id number] 05-17-2014 Patient able to tolerate water soluble PO contrast w/o reaction. rsm MUST PRE-MEDICATE WITH BENADRYL  . Codeine Nausea And Vomiting  . Buprenorphine Hcl Nausea And Vomiting     give benadryl IV side effects lighten  . Morphine And Related Nausea And Vomiting  . Sulfamethoxazole-Trimethoprim Hives and Other (See Comments)    Makes her anxious & keeps her up all night    Medication list has been reviewed and updated.  Current Outpatient Medications on File Prior to Visit  Medication Sig Dispense Refill  . aspirin 81 MG tablet Take 81 mg by mouth daily.    Marland Kitchen azithromycin (ZITHROMAX) 250 MG tablet Take 2 tablets by mouth on day 1, followed by 1 tablet by mouth daily for 4 days. 6 tablet 0  . benzonatate (TESSALON) 100 MG capsule Take 1 capsule (100 mg total) by mouth 3 (three) times daily as needed for cough. 30 capsule 0  . buPROPion (WELLBUTRIN XL) 300 MG 24 hr tablet Take 300 mg by mouth daily.    . cetirizine (ZYRTEC) 10 MG tablet Take 10 mg by mouth daily.    . fluticasone (FLONASE) 50 MCG/ACT nasal spray Place 1 spray into both nostrils daily. 16 g 11  . fluticasone (FLONASE) 50 MCG/ACT nasal spray Place 2 sprays into both nostrils daily. 16 g 1  . lubiprostone (AMITIZA) 8 MCG capsule Take 1 capsule (8 mcg total) by mouth 2 (two) times daily with a meal. 180 capsule 0  . methocarbamol (ROBAXIN) 500 MG tablet Take 1 tablet (500 mg total) by mouth every 8 (eight) hours as needed for muscle spasms. 30 tablet 0  . rosuvastatin (CRESTOR) 20 MG tablet TAKE 1 TABLET BY MOUTH EVERY DAY 90 tablet 3  . traZODone (DESYREL) 50 MG tablet take 1 to 2 tablets by mouth once daily  0  .  valACYclovir (VALTREX) 1000 MG tablet TAKE 1 TABLET (1,000 MG TOTAL) BY MOUTH 2 (TWO) TIMES DAILY AS NEEDED. AS NEEDED FOR COLD SORES 180 tablet 1  . Venlafaxine HCl 225 MG TB24 Take 1 tablet by mouth daily.      No current facility-administered medications on file prior to visit.    Review of Systems:  As per HPI- otherwise negative.   Physical Examination: There were no vitals filed for this visit. There were no vitals filed  for this visit. There is no height or weight on file to calculate BMI. Ideal Body Weight:    Spoke to pt on telephone, she sounds well   Assessment and Plan: Multiple persistent symptoms after COVID-19  Virtual visit today to discuss concern of COVID-19.  Patient was tested but is still waiting on her results.  However, she states that few she feels certain she has [id number] due to her symptoms.  She is actually feeling much better, she feels that the majority of her symptoms were actually caused by azithromycin.  She stopped taking this and overall feels much better, no more crying or other major emotional symptoms.  She spoke for some time about her recent experiences, wanted me to know about them.  She states there is nothing particular that she otherwise needs today Spoke with patient on telephone for 13 minutes s Signed Lamar Blinks, MD

## 2020-09-28 ENCOUNTER — Telehealth (INDEPENDENT_AMBULATORY_CARE_PROVIDER_SITE_OTHER): Payer: 59 | Admitting: Family Medicine

## 2020-09-28 DIAGNOSIS — B948 Sequelae of other specified infectious and parasitic diseases: Secondary | ICD-10-CM | POA: Diagnosis not present

## 2020-09-28 DIAGNOSIS — U099 Post covid-19 condition, unspecified: Secondary | ICD-10-CM

## 2020-10-04 ENCOUNTER — Telehealth: Payer: Self-pay | Admitting: Family Medicine

## 2020-10-04 NOTE — Telephone Encounter (Signed)
Patient call to inform Dr.Copland of her positive covid test

## 2020-10-07 ENCOUNTER — Other Ambulatory Visit (INDEPENDENT_AMBULATORY_CARE_PROVIDER_SITE_OTHER): Payer: 59

## 2020-10-07 ENCOUNTER — Ambulatory Visit (INDEPENDENT_AMBULATORY_CARE_PROVIDER_SITE_OTHER): Payer: 59 | Admitting: Physician Assistant

## 2020-10-07 ENCOUNTER — Encounter: Payer: Self-pay | Admitting: Physician Assistant

## 2020-10-07 VITALS — BP 118/66 | HR 70 | Ht 62.0 in | Wt 141.0 lb

## 2020-10-07 DIAGNOSIS — R143 Flatulence: Secondary | ICD-10-CM

## 2020-10-07 DIAGNOSIS — R142 Eructation: Secondary | ICD-10-CM

## 2020-10-07 DIAGNOSIS — R141 Gas pain: Secondary | ICD-10-CM

## 2020-10-07 DIAGNOSIS — R140 Abdominal distension (gaseous): Secondary | ICD-10-CM

## 2020-10-07 DIAGNOSIS — K589 Irritable bowel syndrome without diarrhea: Secondary | ICD-10-CM

## 2020-10-07 DIAGNOSIS — K5900 Constipation, unspecified: Secondary | ICD-10-CM

## 2020-10-07 DIAGNOSIS — Z86010 Personal history of colonic polyps: Secondary | ICD-10-CM

## 2020-10-07 LAB — CBC WITH DIFFERENTIAL/PLATELET
Basophils Absolute: 0.1 10*3/uL (ref 0.0–0.1)
Basophils Relative: 1.9 % (ref 0.0–3.0)
Eosinophils Absolute: 0.3 10*3/uL (ref 0.0–0.7)
Eosinophils Relative: 5.3 % — ABNORMAL HIGH (ref 0.0–5.0)
HCT: 39.4 % (ref 36.0–46.0)
Hemoglobin: 13.3 g/dL (ref 12.0–15.0)
Lymphocytes Relative: 21.5 % (ref 12.0–46.0)
Lymphs Abs: 1.3 10*3/uL (ref 0.7–4.0)
MCHC: 33.6 g/dL (ref 30.0–36.0)
MCV: 85.7 fl (ref 78.0–100.0)
Monocytes Absolute: 0.5 10*3/uL (ref 0.1–1.0)
Monocytes Relative: 8.9 % (ref 3.0–12.0)
Neutro Abs: 3.8 10*3/uL (ref 1.4–7.7)
Neutrophils Relative %: 62.4 % (ref 43.0–77.0)
Platelets: 361 10*3/uL (ref 150.0–400.0)
RBC: 4.6 Mil/uL (ref 3.87–5.11)
RDW: 13.4 % (ref 11.5–15.5)
WBC: 6.1 10*3/uL (ref 4.0–10.5)

## 2020-10-07 MED ORDER — LUBIPROSTONE 24 MCG PO CAPS
24.0000 ug | ORAL_CAPSULE | Freq: Two times a day (BID) | ORAL | 3 refills | Status: DC
Start: 2020-10-07 — End: 2021-10-23
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
## 2020-10-07 NOTE — Patient Instructions (Signed)
If you are age 62 or older, your body mass index should be between 23-30. Your Body mass index is 25.79 kg/m. If this is out of the aforementioned range listed, please consider follow up with your Primary Care Provider.  If you are age 19 or younger, your body mass index should be between 19-25. Your Body mass index is 25.79 kg/m. If this is out of the aformentioned range listed, please consider follow up with your Primary Care Provider.   Your provider has requested that you go to the basement level for lab work before leaving today. Press "B" on the elevator. The lab is located at the first door on the left as you exit the elevator.  You have been given a testing kit to check for small intestine bacterial overgrowth (SIBO) which is completed by a company named [facility]. Make sure to return your test in the mail using the return mailing label given to you along with the kit. Your demographic and insurance information have already been sent to the company and they should be in contact with you over the next week regarding this test. [facility] will collect an upfront charge of $99.74 for commercial insurance plans and $209.74 is you are paying cash. Make sure to discuss with [facility] PRIOR to having the test if they have gotten informatoin from your insurance company as to how much your testing will cost out of pocket, if any. Please keep in mind that you will be getting a call from phone number [phone] or a similar number. If you do not hear from them within this time frame, please call our office at [phone].   START Amitiza 24 mcg 1 capsule twice daily. We have sent the generic to your pharmacy.  Try a lactose free diet.  Review Low Fodmap diet.  May try [facility].  Follow up pending your labs.  Thank you for entrusting me with your care and choosing [facility].  Amy Esterwood, PA-C

## 2020-10-10 ENCOUNTER — Other Ambulatory Visit: Payer: 59

## 2020-10-10 DIAGNOSIS — R142 Eructation: Secondary | ICD-10-CM

## 2020-10-10 DIAGNOSIS — R141 Gas pain: Secondary | ICD-10-CM

## 2020-10-10 DIAGNOSIS — R140 Abdominal distension (gaseous): Secondary | ICD-10-CM

## 2020-10-10 DIAGNOSIS — Z86010 Personal history of colonic polyps: Secondary | ICD-10-CM

## 2020-10-10 DIAGNOSIS — Z860101 Personal history of adenomatous and serrated colon polyps: Secondary | ICD-10-CM

## 2020-10-10 DIAGNOSIS — K5900 Constipation, unspecified: Secondary | ICD-10-CM

## 2020-10-10 DIAGNOSIS — K589 Irritable bowel syndrome without diarrhea: Secondary | ICD-10-CM

## 2020-10-10 LAB — TISSUE TRANSGLUTAMINASE, IGA: (tTG) Ab, IgA: <1 U/mL

## 2020-10-10 LAB — IGA: Immunoglobulin A: 188 mg/dL (ref 70–320)

## 2020-10-14 ENCOUNTER — Other Ambulatory Visit: Payer: Self-pay | Admitting: Medical

## 2020-10-17 LAB — PANCREATIC ELASTASE, FECAL: Pancreatic Elastase-1, Stool: 395 mcg/g

## 2020-10-18 ENCOUNTER — Telehealth: Payer: Self-pay | Admitting: Physician Assistant

## 2020-10-18 NOTE — Telephone Encounter (Signed)
Pt is returning a missed call regarding her lab results.

## 2020-10-19 NOTE — Telephone Encounter (Signed)
Advised of her fecal elastase results which were normal and did not indicate pancreatic insufficiency

## 2020-10-21 ENCOUNTER — Telehealth: Payer: Self-pay

## 2020-10-21 NOTE — Telephone Encounter (Signed)
Alfredia Ferguson, PA-C  10/18/2020 8:51 AM EST Back to Top     Please let pt know the fecal elastase was normal so no sign of pancreas insufficiency

## 2020-10-21 NOTE — Telephone Encounter (Signed)
3 attempts have been made to patient,  by leaving voice mails to return phone call. Letter is being mailed with results.

## 2020-11-07 NOTE — Progress Notes (Signed)
Reviewed and agree with documentation and assessment and plan. K. Veena Jayr Lupercio , MD   

## 2020-12-27 ENCOUNTER — Other Ambulatory Visit: Payer: 59

## 2020-12-27 NOTE — Telephone Encounter (Signed)
Patient called with questions about Pancreas Insufficiency. She believes she still have it and would like to discuss more about medications and possible other tests. She also stated she received a kit from [facility] and states if it is not going to give her a proper diagnosis she doesn't want to take it because of cost. She states she would really rather try a medication instead.

## 2020-12-27 NOTE — Telephone Encounter (Signed)
Spoke with the patient. She received a bill from [facility]. She thought the bill was for the SIBO test kit. She is unsure. She will bring by the bill to have me look at it. I did explain the SIBO test is not done through [facility]. She has not done this test to date.

## 2020-12-31 NOTE — Progress Notes (Deleted)
[place] at [facility] [address] [place], [place] [address] [phone]  Date:  01/04/2021   Name:  Jamie Levy   DOB:  08/31/1961   MRN:  [id number]  PCP:  Darreld Mclean, MD    Chief Complaint: No chief complaint on file.   History of Present Illness:  Jamie Levy is a 60 y.o. very pleasant female patient who presents with the following:  Patient here today with concern of hand pain Last seen by myself in January for virtual visit with concern for acute illness History of sleep apnea, IBS, dyslipidemia, depression, skin cancer, prediabetes   She was seen by GI in January to discuss IBS, seen by her psychiatrist last month  Patient Active Problem List   Diagnosis Date Noted  . Hx of adenomatous colonic polyps 10/07/2020  . Prediabetes 05/21/2020  . Osteopenia 06/01/2019  . 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 Facial rash 05/20/2012  . Weight loss, abnormal 07/09/2011  . Skin cancer 02/16/2011  . CONSTIPATION 12/02/2009  . Flatulence, eructation, and gas pain 12/02/2009  . ABDOMINAL PAIN RIGHT LOWER QUADRANT 12/02/2009  . SLEEP APNEA 04/27/2009  . IRRITABLE BOWEL SYNDROME 03/28/2009  . RECTAL BLEEDING 03/15/2009  . Dyslipidemia 03/11/2009  . DEPRESSION 03/11/2009  . SINUSITIS, CHRONIC 03/11/2009  . Diverticulosis of colon (without mention of hemorrhage) 03/11/2009  . Abdominal pain, left lower quadrant 03/11/2009    Past Medical History:  Diagnosis Date  . Abdominal pain, left lower quadrant   . Allergy   . Anxiety   . Depressive disorder, not elsewhere classified   . Diarrhea of presumed infectious origin   . Diverticulosis of colon (without mention of hemorrhage)   . Facial ringworm May '14  . Irritable bowel syndrome   . Other and unspecified hyperlipidemia   . Sleep apnea   . Unspecified sinusitis (chronic)   . Unspecified sleep apnea     Past Surgical History:   Procedure Laterality Date  . ABDOMINAL HYSTERECTOMY  1997   BSo- Fibroids  . COLONOSCOPY    . elbow cystectomy    . NASAL SINUS SURGERY     x 7 most recent June '14    Social History   Tobacco Use  . Smoking status: Never Smoker  . Smokeless tobacco: Never Used  Vaping Use  . Vaping Use: Never used  Substance Use Topics  . Alcohol use: Yes    Alcohol/week: 3.0 standard drinks    Types: 3 Standard drinks or equivalent per week    Comment: very rare, avoids alcohol when depressed  . Drug use: No    Family History  Problem Relation Age of Onset  . Ovarian cancer Mother   . Prostate cancer Father   . Colon polyps Father   . Heart disease Father        CAD/CABG/Stents  . Melanoma Father   . Depression Father        suicidal in the past  . Mental illness Father   . Bladder Cancer Father   . Colon cancer Neg Hx   . Stomach cancer Neg Hx   . Esophageal cancer Neg Hx     Allergies  Allergen Reactions  . Compazine [Prochlorperazine Maleate] Anaphylaxis    Stroke like symptoms   . Iodinated Diagnostic Agents Hives  . Iohexol Hives and Other (See Comments)     Code: HIVES, Desc: pt developed one hive on rt upper  arm and itching on upper lip after receiving 174ml omni 300, needs to be pre medicated, Onset Date: [id number] 05-17-2014 Patient able to tolerate water soluble PO contrast w/o reaction. rsm MUST PRE-MEDICATE WITH BENADRYL  . Codeine Nausea And Vomiting  . Buprenorphine Hcl Nausea And Vomiting     give benadryl IV side effects lighten  . Morphine And Related Nausea And Vomiting  . Sulfamethoxazole-Trimethoprim Hives and Other (See Comments)    Makes her anxious & keeps her up all night    Medication list has been reviewed and updated.  Current Outpatient Medications on File Prior to Visit  Medication Sig Dispense Refill  . aspirin 81 MG tablet Take 81 mg by mouth daily.    Marland Kitchen buPROPion (WELLBUTRIN XL) 300 MG 24 hr tablet Take 300 mg by mouth daily.    .  cetirizine (ZYRTEC) 10 MG tablet Take 10 mg by mouth daily.    . fluticasone (FLONASE) 50 MCG/ACT nasal spray Place 2 sprays into both nostrils daily. 16 mL 12  . lubiprostone (AMITIZA) 24 MCG capsule Take 1 capsule (24 mcg total) by mouth 2 (two) times daily with a meal. 60 capsule 3  . rosuvastatin (CRESTOR) 20 MG tablet TAKE 1 TABLET BY MOUTH EVERY DAY 90 tablet 3  . traZODone (DESYREL) 50 MG tablet take 1 to 2 tablets by mouth once daily  0  . valACYclovir (VALTREX) 1000 MG tablet TAKE 1 TABLET (1,000 MG TOTAL) BY MOUTH 2 (TWO) TIMES DAILY AS NEEDED. AS NEEDED FOR COLD SORES 180 tablet 1  . Venlafaxine HCl 225 MG TB24 Take 1 tablet by mouth daily.      No current facility-administered medications on file prior to visit.    Review of Systems:  As per HPI- otherwise negative.   Physical Examination: There were no vitals filed for this visit. There were no vitals filed for this visit. There is no height or weight on file to calculate BMI. Ideal Body Weight:   GEN: no acute distress. HEENT: Atraumatic, Normocephalic.  Ears and Nose: No external deformity. CV: RRR, No M/G/R. No JVD. No thrill. No extra heart sounds. PULM: CTA B, no wheezes, crackles, rhonchi. No retractions. No resp. distress. No accessory muscle use. ABD: S, NT, ND, +BS. No rebound. No HSM. EXTR: No c/c/e PSYCH: Normally interactive. Conversant.    Assessment and Plan: *** This visit occurred during the SARS-CoV-2 public health emergency.  Safety protocols were in place, including screening questions prior to the visit, additional usage of staff PPE, and extensive cleaning of exam room while observing appropriate contact time as indicated for disinfecting solutions.    Signed Lamar Blinks, MD

## 2021-01-04 ENCOUNTER — Ambulatory Visit: Payer: 59 | Admitting: Family Medicine

## 2021-01-10 NOTE — Progress Notes (Deleted)
[place] at [facility] [address] [place], [place] [address] [phone]  Date:  01/11/2021   Name:  Jamie Levy   DOB:  [birth year]   MRN:  [id number]  PCP:  Darreld Mclean, MD    Chief Complaint: No chief complaint on file.   History of Present Illness:  Jamie Levy is a 60 y.o. very pleasant female patient who presents with the following:  Patient seen today with concern of hand pain- History of sleep apnea, IBS, dyslipidemia, depression, skin cancer, prediabetes  Last visit with myself-virtual visit in January for COVID-19 symptoms She also is under the care of of psychiatry, Dr. Domenic Schwab recent visit in March  COVID-19 booster Can update lab work today if she likes [facility] Patient Active Problem List   Diagnosis Date Noted  . Hx of adenomatous colonic polyps 10/07/2020  . Prediabetes 05/21/2020  . Osteopenia 06/01/2019  . 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 Facial rash 05/20/2012  . Weight loss, abnormal 07/09/2011  . Skin cancer 02/16/2011  . CONSTIPATION 12/02/2009  . Flatulence, eructation, and gas pain 12/02/2009  . ABDOMINAL PAIN RIGHT LOWER QUADRANT 12/02/2009  . SLEEP APNEA 04/27/2009  . IRRITABLE BOWEL SYNDROME 03/28/2009  . RECTAL BLEEDING 03/15/2009  . Dyslipidemia 03/11/2009  . DEPRESSION 03/11/2009  . SINUSITIS, CHRONIC 03/11/2009  . Diverticulosis of colon (without mention of hemorrhage) 03/11/2009  . Abdominal pain, left lower quadrant 03/11/2009    Past Medical History:  Diagnosis Date  . Abdominal pain, left lower quadrant   . Allergy   . Anxiety   . Depressive disorder, not elsewhere classified   . Diarrhea of presumed infectious origin   . Diverticulosis of colon (without mention of hemorrhage)   . Facial ringworm May '14  . Irritable bowel syndrome   . Other and unspecified hyperlipidemia   . Sleep apnea   . Unspecified sinusitis (chronic)   .  Unspecified sleep apnea     Past Surgical History:  Procedure Laterality Date  . ABDOMINAL HYSTERECTOMY  1997   BSo- Fibroids  . COLONOSCOPY    . elbow cystectomy    . NASAL SINUS SURGERY     x 7 most recent June '14    Social History   Tobacco Use  . Smoking status: Never Smoker  . Smokeless tobacco: Never Used  Vaping Use  . Vaping Use: Never used  Substance Use Topics  . Alcohol use: Yes    Alcohol/week: 3.0 standard drinks    Types: 3 Standard drinks or equivalent per week    Comment: very rare, avoids alcohol when depressed  . Drug use: No    Family History  Problem Relation Age of Onset  . Ovarian cancer Mother   . Prostate cancer Father   . Colon polyps Father   . Heart disease Father        CAD/CABG/Stents  . Melanoma Father   . Depression Father        suicidal in the past  . Mental illness Father   . Bladder Cancer Father   . Colon cancer Neg Hx   . Stomach cancer Neg Hx   . Esophageal cancer Neg Hx     Allergies  Allergen Reactions  . Compazine [Prochlorperazine Maleate] Anaphylaxis    Stroke like symptoms   . Iodinated Diagnostic Agents Hives  . Iohexol Hives and Other (See Comments)     Code: HIVES, Desc: pt developed  one hive on rt upper arm and itching on upper lip after receiving 110ml omni 300, needs to be pre medicated, Onset Date: [id number] 05-17-2014 Patient able to tolerate water soluble PO contrast w/o reaction. rsm MUST PRE-MEDICATE WITH BENADRYL  . Codeine Nausea And Vomiting  . Buprenorphine Hcl Nausea And Vomiting     give benadryl IV side effects lighten  . Morphine And Related Nausea And Vomiting  . Sulfamethoxazole-Trimethoprim Hives and Other (See Comments)    Makes her anxious & keeps her up all night    Medication list has been reviewed and updated.  Current Outpatient Medications on File Prior to Visit  Medication Sig Dispense Refill  . aspirin 81 MG tablet Take 81 mg by mouth daily.    Marland Kitchen buPROPion (WELLBUTRIN XL) 300 MG  24 hr tablet Take 300 mg by mouth daily.    . cetirizine (ZYRTEC) 10 MG tablet Take 10 mg by mouth daily.    . fluticasone (FLONASE) 50 MCG/ACT nasal spray Place 2 sprays into both nostrils daily. 16 mL 12  . lubiprostone (AMITIZA) 24 MCG capsule Take 1 capsule (24 mcg total) by mouth 2 (two) times daily with a meal. 60 capsule 3  . rosuvastatin (CRESTOR) 20 MG tablet TAKE 1 TABLET BY MOUTH EVERY DAY 90 tablet 3  . traZODone (DESYREL) 50 MG tablet take 1 to 2 tablets by mouth once daily  0  . valACYclovir (VALTREX) 1000 MG tablet TAKE 1 TABLET (1,000 MG TOTAL) BY MOUTH 2 (TWO) TIMES DAILY AS NEEDED. AS NEEDED FOR COLD SORES 180 tablet 1  . Venlafaxine HCl 225 MG TB24 Take 1 tablet by mouth daily.      No current facility-administered medications on file prior to visit.    Review of Systems:  As per HPI- otherwise negative.   Physical Examination: There were no vitals filed for this visit. There were no vitals filed for this visit. There is no height or weight on file to calculate BMI. Ideal Body Weight:    GEN: no acute distress. HEENT: Atraumatic, Normocephalic.  Ears and Nose: No external deformity. CV: RRR, No M/G/R. No JVD. No thrill. No extra heart sounds. PULM: CTA B, no wheezes, crackles, rhonchi. No retractions. No resp. distress. No accessory muscle use. ABD: S, NT, ND, +BS. No rebound. No HSM. EXTR: No c/c/e PSYCH: Normally interactive. Conversant.    Assessment and Plan: *** This visit occurred during the SARS-CoV-2 public health emergency.  Safety protocols were in place, including screening questions prior to the visit, additional usage of staff PPE, and extensive cleaning of exam room while observing appropriate contact time as indicated for disinfecting solutions.    Signed Lamar Blinks, MD

## 2021-01-10 NOTE — Patient Instructions (Incomplete)
It was good to see you again today!   

## 2021-01-11 ENCOUNTER — Ambulatory Visit: Payer: 59 | Admitting: Family Medicine

## 2021-01-17 ENCOUNTER — Other Ambulatory Visit: Payer: Self-pay

## 2021-01-17 ENCOUNTER — Ambulatory Visit
Admission: EM | Admit: 2021-01-17 | Discharge: 2021-01-17 | Disposition: A | Discharge status: Home / Self Care | Payer: 59 | Attending: Emergency Medicine | Admitting: Emergency Medicine

## 2021-01-17 DIAGNOSIS — R112 Nausea with vomiting, unspecified: Secondary | ICD-10-CM | POA: Diagnosis not present

## 2021-01-17 DIAGNOSIS — R197 Diarrhea, unspecified: Secondary | ICD-10-CM

## 2021-01-17 DIAGNOSIS — Z20822 Contact with and (suspected) exposure to covid-19: Secondary | ICD-10-CM | POA: Diagnosis not present

## 2021-01-17 DIAGNOSIS — R509 Fever, unspecified: Secondary | ICD-10-CM

## 2021-01-17 MED ORDER — ONDANSETRON 4 MG PO TBDP: 4.0000 mg | ORAL_TABLET | Freq: Three times a day (TID) | ORAL | 0 refills | Status: AC | PRN

## 2021-01-17 NOTE — ED Triage Notes (Signed)
Pt c/o diarrhea yesterday morning and this morning. States developed nausea after taking gas x. States felt like a ball in her throat and she was nauseous. C/o fatigue today.

## 2021-01-17 NOTE — Discharge Instructions (Signed)
Zofran dissolved in mouth earlier in the day as needed for nausea Increase fluid intake Follow up in emergency room if not improving or worsening

## 2021-01-17 NOTE — ED Provider Notes (Signed)
[name]    CSN: [id number] Arrival date & time: 01/17/21  1734      History   Chief Complaint Chief Complaint  Patient presents with  . Diarrhea    HPI Jamie Levy is a 60 y.o. female history of IBS presenting today for evaluation of abdominal pain vomiting and diarrhea.  Reports yesterday morning woke up with diarrhea which resolved, returned this morning.  Has had sensation of nausea without vomiting.  Reports sensation of ball in throat without ability to throw up.  Generalized fatigue weakness.  Reports poor oral intake over the past 48 hours.  Reports some lightheadedness and dizziness.  Denies recent antibiotic use.  Denies any URI symptoms other than regular allergy congestion.  HPI  Past Medical History:  Diagnosis Date  . Abdominal pain, left lower quadrant   . Allergy   . Anxiety   . Depressive disorder, not elsewhere classified   . Diarrhea of presumed infectious origin   . Diverticulosis of colon (without mention of hemorrhage)   . Facial ringworm May '14  . Irritable bowel syndrome   . Other and unspecified hyperlipidemia   . Sleep apnea   . Unspecified sinusitis (chronic)   . Unspecified sleep apnea     Patient Active Problem List   Diagnosis Date Noted  . Hx of adenomatous colonic polyps 10/07/2020  . Prediabetes 05/21/2020  . Osteopenia 06/01/2019  . 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 Facial rash 05/20/2012  . Weight loss, abnormal 07/09/2011  . Skin cancer 02/16/2011  . CONSTIPATION 12/02/2009  . Flatulence, eructation, and gas pain 12/02/2009  . ABDOMINAL PAIN RIGHT LOWER QUADRANT 12/02/2009  . SLEEP APNEA 04/27/2009  . IRRITABLE BOWEL SYNDROME 03/28/2009  . RECTAL BLEEDING 03/15/2009  . Dyslipidemia 03/11/2009  . DEPRESSION 03/11/2009  . SINUSITIS, CHRONIC 03/11/2009  . Diverticulosis of colon (without mention of hemorrhage) 03/11/2009  . Abdominal pain, left lower quadrant 03/11/2009    Past Surgical  History:  Procedure Laterality Date  . ABDOMINAL HYSTERECTOMY  1997   BSo- Fibroids  . COLONOSCOPY    . elbow cystectomy    . NASAL SINUS SURGERY     x 7 most recent June '14    OB History   No obstetric history on file.      Home Medications    Prior to Admission medications   Medication Sig Start Date End Date Taking? Authorizing Provider  ondansetron (ZOFRAN ODT) 4 MG disintegrating tablet Take 1 tablet (4 mg total) by mouth every 8 (eight) hours as needed for nausea or vomiting. 01/17/21  Yes Leasha Goldberger C, PA-C  aspirin 81 MG tablet Take 81 mg by mouth daily.    [provider]  buPROPion (WELLBUTRIN XL) 300 MG 24 hr tablet Take 300 mg by mouth daily.    [provider]  cetirizine (ZYRTEC) 10 MG tablet Take 10 mg by mouth daily.    [provider]  fluticasone (FLONASE) 50 MCG/ACT nasal spray Place 2 sprays into both nostrils daily. 10/14/20   Copland, Gay Filler, MD  lubiprostone (AMITIZA) 24 MCG capsule Take 1 capsule (24 mcg total) by mouth 2 (two) times daily with a meal. 10/07/20   Esterwood, Amy S, PA-C  rosuvastatin (CRESTOR) 20 MG tablet TAKE 1 TABLET BY MOUTH EVERY DAY 06/02/20   Copland, Gay Filler, MD  traZODone (DESYREL) 50 MG tablet take 1 to 2 tablets by mouth once daily 12/26/16   [provider]  valACYclovir (VALTREX) 1000  MG tablet TAKE 1 TABLET (1,000 MG TOTAL) BY MOUTH 2 (TWO) TIMES DAILY AS NEEDED. AS NEEDED FOR COLD SORES 08/24/20   Copland, Gay Filler, MD  Venlafaxine HCl 225 MG TB24 Take 1 tablet by mouth daily.  07/30/18   [provider]    Family History Family History  Problem Relation Age of Onset  . Ovarian cancer Mother   . Prostate cancer Father   . Colon polyps Father   . Heart disease Father        CAD/CABG/Stents  . Melanoma Father   . Depression Father        suicidal in the past  . Mental illness Father   . Bladder Cancer Father   . Colon cancer Neg Hx   . Stomach cancer Neg Hx   .  Esophageal cancer Neg Hx     Social History Social History   Tobacco Use  . Smoking status: Never Smoker  . Smokeless tobacco: Never Used  Vaping Use  . Vaping Use: Never used  Substance Use Topics  . Alcohol use: Yes    Alcohol/week: 3.0 standard drinks    Types: 3 Standard drinks or equivalent per week    Comment: very rare, avoids alcohol when depressed  . Drug use: No     Allergies   Compazine [prochlorperazine maleate], Iodinated diagnostic agents, Iohexol, Codeine, Buprenorphine hcl, Morphine and related, and Sulfamethoxazole-trimethoprim   Review of Systems Review of Systems  Constitutional: Positive for fatigue and fever. Negative for activity change, appetite change and chills.  HENT: Negative for congestion, ear pain, rhinorrhea, sinus pressure, sore throat and trouble swallowing.   Eyes: Negative for discharge and redness.  Respiratory: Negative for cough, chest tightness and shortness of breath.   Cardiovascular: Negative for chest pain.  Gastrointestinal: Positive for abdominal pain, diarrhea, nausea and vomiting.  Musculoskeletal: Negative for myalgias.  Skin: Negative for rash.  Neurological: Positive for light-headedness. Negative for dizziness and headaches.     Physical Exam Triage Vital Signs ED Triage Vitals  Enc Vitals Group     BP      Pulse      Resp      Temp      Temp src      SpO2      Weight      Height      Head Circumference      Peak Flow      Pain Score      Pain Loc      Pain Edu?      Excl. in [place]?    No data found.  Updated Vital Signs BP (!) 99/59 (BP Location: Left Arm)   Pulse (!) 117   Temp 99.7 F (37.6 C) (Oral)   Resp 18   SpO2 99%   Visual Acuity Right Eye Distance:   Left Eye Distance:   Bilateral Distance:    Right Eye Near:   Left Eye Near:    Bilateral Near:     Physical Exam Vitals and nursing note reviewed.  Constitutional:      Appearance: She is well-developed.     Comments: No acute  distress  HENT:     Head: Normocephalic and atraumatic.     Ears:     Comments: Bilateral ears without tenderness to palpation of external auricle, tragus and mastoid, EAC's without erythema or swelling, TM's with good bony landmarks and cone of light. Non erythematous.     Nose: Nose normal.  Mouth/Throat:     Comments: Oral mucosa pink and moist, no tonsillar enlargement or exudate. Posterior pharynx patent and nonerythematous, no uvula deviation or swelling. Normal phonation. Eyes:     Conjunctiva/sclera: Conjunctivae normal.  Cardiovascular:     Rate and Rhythm: Normal rate.  Pulmonary:     Effort: Pulmonary effort is normal. No respiratory distress.     Comments: Breathing comfortably at rest, CTABL, no wheezing, rales or other adventitious sounds auscultated Abdominal:     General: There is no distension.     Comments: Soft, nondistended, nontender to palpation throughout abdomen  Musculoskeletal:        General: Normal range of motion.     Cervical back: Neck supple.  Skin:    General: Skin is warm and dry.  Neurological:     Mental Status: She is alert and oriented to person, place, and time.      UC Treatments / Results  Labs (all labs ordered are listed, but only abnormal results are displayed) Labs Reviewed  COVID-19, FLU A+B NAA    EKG   Radiology No results found.  Procedures Procedures (including critical care time)  Medications Ordered in UC Medications - No data to display  Initial Impression / Assessment and Plan / UC Course  I have reviewed the triage vital signs and the nursing notes.  Pertinent labs & imaging results that were available during my care of the patient were reviewed by me and considered in my medical decision making (see chart for details).     Suspect viral gastroenteritis-COVID and flu test pending, no abdominal tenderness, 2 days of symptoms, recommending continued symptomatic and supportive care.  Zofran as needed for  nausea/vomiting, discussed using this earlier in the day and avoiding use around bedtime with trazodone to limit serotonergic effects.  Blood pressure slightly soft at 99/59, prior readings on record show patient's blood pressure is typically on the lower side, but discussed with this with patient and stressed importance of oral rehydration, follow-up in emergency room if symptoms progressing or worsening.  Discussed strict return precautions. Patient verbalized understanding and is agreeable with plan.  Final Clinical Impressions(s) / UC Diagnoses   Final diagnoses:  Encounter for screening laboratory testing for COVID-19 virus  Nausea vomiting and diarrhea  Fever, unspecified     Discharge Instructions     Zofran dissolved in mouth earlier in the day as needed for nausea Increase fluid intake Follow up in emergency room if not improving or worsening    ED Prescriptions    Medication Sig Dispense Auth. Provider   ondansetron (ZOFRAN ODT) 4 MG disintegrating tablet Take 1 tablet (4 mg total) by mouth every 8 (eight) hours as needed for nausea or vomiting. 20 tablet Trenda Corliss, [place] C, PA-C     PDMP not reviewed this encounter.   Janith Lima, [place] 01/17/21 1846

## 2021-01-18 LAB — COVID-19, FLU A+B NAA
Influenza A, NAA: NOT DETECTED
Influenza B, NAA: NOT DETECTED
SARS-CoV-2, NAA: NOT DETECTED

## 2021-01-23 ENCOUNTER — Ambulatory Visit: Payer: 59 | Admitting: Family Medicine

## 2021-06-02 ENCOUNTER — Encounter: Payer: Self-pay | Admitting: Gastroenterology

## 2021-06-07 ENCOUNTER — Encounter (HOSPITAL_BASED_OUTPATIENT_CLINIC_OR_DEPARTMENT_OTHER): Payer: Self-pay | Admitting: Emergency Medicine

## 2021-06-07 ENCOUNTER — Emergency Department (HOSPITAL_BASED_OUTPATIENT_CLINIC_OR_DEPARTMENT_OTHER)
Admission: EM | Admit: 2021-06-07 | Discharge: 2021-06-07 | Disposition: A | Discharge status: Home / Self Care | Payer: 59 | Attending: Emergency Medicine | Admitting: Emergency Medicine

## 2021-06-07 ENCOUNTER — Other Ambulatory Visit: Payer: Self-pay

## 2021-06-07 DIAGNOSIS — M545 Low back pain, unspecified: Secondary | ICD-10-CM | POA: Insufficient documentation

## 2021-06-07 DIAGNOSIS — Z7982 Long term (current) use of aspirin: Secondary | ICD-10-CM | POA: Insufficient documentation

## 2021-06-07 DIAGNOSIS — Z86018 Personal history of other benign neoplasm: Secondary | ICD-10-CM | POA: Diagnosis not present

## 2021-06-07 MED ORDER — METHOCARBAMOL 500 MG PO TABS: 500.0000 mg | ORAL_TABLET | Freq: Two times a day (BID) | ORAL | 0 refills | Status: DC

## 2021-06-07 NOTE — Discharge Instructions (Signed)
Please use Tylenol or ibuprofen for pain.  You may use 600 mg ibuprofen every 6 hours or 1000 mg of Tylenol every 6 hours.  You may choose to alternate between the 2.  This would be most effective.  Not to exceed 4 g of Tylenol within 24 hours.  Not to exceed 3200 mg ibuprofen 24 hours.  You may use robaxin as needed for breakthrough pain. I encourage gentle stretching.

## 2021-06-07 NOTE — ED Notes (Signed)
Pt discharged to home. Discharge instructions have been discussed with patient and/or family members. Pt verbally acknowledges understanding d/c instructions, and endorses comprehension to checkout at registration before leaving.  °

## 2021-06-07 NOTE — ED Triage Notes (Signed)
Pt reports LT lower back pain; she woke up this morning and could not walk; ambulatory now; sts she is packing to move and thinks this could be related

## 2021-06-07 NOTE — ED Notes (Signed)
Endorses pain when bearing wt on left leg last night, has resolved today

## 2021-06-07 NOTE — ED Provider Notes (Signed)
[facility] Provider Note   CSN: [id number] Arrival date & time: 06/07/21  1054     History Chief Complaint  Patient presents with   Back Pain    Jamie Levy is a 60 y.o. female with no significant past medical history who presents with left lower back pain.  Patient reports that she has been packing to move, has done a lot of bending over.  Patient reports that last night she had significant pain, reports that she could not walk.  Patient now walking without difficulty, with minimal pain.  Patient has no past medical history of cancer, IV drug use, fever, chronic corticosteroid use.  Patient does report some radiation of the pain into her knee, without numbness or tingling.  Patient has no past medical history of previous back surgery, or back pain complaints.   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   Facial rash 05/20/2012   Weight loss, abnormal 07/09/2011   Skin cancer 02/16/2011   CONSTIPATION 12/02/2009   Flatulence, eructation, and gas pain 12/02/2009   ABDOMINAL PAIN RIGHT LOWER QUADRANT 12/02/2009   SLEEP APNEA 04/27/2009   IRRITABLE BOWEL SYNDROME 03/28/2009   RECTAL BLEEDING 03/15/2009   Dyslipidemia 03/11/2009   DEPRESSION 03/11/2009   SINUSITIS, CHRONIC 03/11/2009   Diverticulosis of colon (without mention of hemorrhage) 03/11/2009    Abdominal pain, left lower quadrant 03/11/2009    Past Surgical History:  Procedure Laterality Date   ABDOMINAL HYSTERECTOMY  1997   BSo- Fibroids   COLONOSCOPY     elbow cystectomy     NASAL SINUS SURGERY     x 7 most recent June '14     OB History   No obstetric history on file.     Family History  Problem Relation Age of Onset   Ovarian cancer Mother    Prostate cancer Father    Colon polyps Father    Heart disease Father        CAD/CABG/Stents   Melanoma Father    Depression Father        suicidal in the past   Mental illness Father    Bladder Cancer Father    Colon cancer Neg Hx    Stomach cancer Neg Hx    Esophageal cancer Neg Hx     Social History   Tobacco Use   Smoking status: Never   Smokeless tobacco: Never  Vaping Use   Vaping Use: Never used  Substance Use Topics   Alcohol use: Yes    Alcohol/week: 3.0 standard drinks    Types: 3 Standard drinks or equivalent per week    Comment: very rare, avoids alcohol when depressed   Drug use: No    Home Medications Prior to Admission medications   Medication Sig Start Date End Date  Taking? Authorizing Provider  methocarbamol (ROBAXIN) 500 MG tablet Take 1 tablet (500 mg total) by mouth 2 (two) times daily. 06/07/21  Yes Rheagan Nayak H, PA-C  aspirin 81 MG tablet Take 81 mg by mouth daily.    [provider]  buPROPion (WELLBUTRIN XL) 300 MG 24 hr tablet Take 300 mg by mouth daily.    [provider]  cetirizine (ZYRTEC) 10 MG tablet Take 10 mg by mouth daily.    [provider]  fluticasone (FLONASE) 50 MCG/ACT nasal spray Place 2 sprays into both nostrils daily. 10/14/20   Copland, Gay Filler, MD  lubiprostone (AMITIZA) 24 MCG capsule Take 1 capsule (24 mcg total) by mouth 2 (two) times daily with a meal. 10/07/20   Esterwood, Amy S, PA-C  ondansetron (ZOFRAN ODT) 4 MG disintegrating tablet Take 1 tablet (4 mg total) by mouth every 8 (eight) hours as needed for nausea or  vomiting. 01/17/21   Wieters, Hallie C, PA-C  rosuvastatin (CRESTOR) 20 MG tablet TAKE 1 TABLET BY MOUTH EVERY DAY 06/02/20   Copland, Gay Filler, MD  traZODone (DESYREL) 50 MG tablet take 1 to 2 tablets by mouth once daily 12/26/16   [provider]  valACYclovir (VALTREX) 1000 MG tablet TAKE 1 TABLET (1,000 MG TOTAL) BY MOUTH 2 (TWO) TIMES DAILY AS NEEDED. AS NEEDED FOR COLD SORES 08/24/20   Copland, Gay Filler, MD  Venlafaxine HCl 225 MG TB24 Take 1 tablet by mouth daily.  07/30/18   [provider]    Allergies    Compazine [prochlorperazine maleate], Iodinated diagnostic agents, Iohexol, Codeine, Buprenorphine hcl, Morphine and related, and Sulfamethoxazole-trimethoprim  Review of Systems   Review of Systems  Musculoskeletal:  Positive for back pain.  All other systems reviewed and are negative.  Physical Exam Updated Vital Signs BP (!) 143/117   Pulse 90   Temp 98.3 F (36.8 C) (Oral)   Resp 16   Ht 5\' 2"  (1.575 m)   Wt 68 kg   SpO2 99%   BMI 27.44 kg/m   Physical Exam Vitals and nursing note reviewed.  Constitutional:      General: She is not in acute distress.    Appearance: Normal appearance.  HENT:     Head: Normocephalic and atraumatic.  Eyes:     General:        Right eye: No discharge.        Left eye: No discharge.  Cardiovascular:     Rate and Rhythm: Normal rate and regular rhythm.  Pulmonary:     Effort: Pulmonary effort is normal. No respiratory distress.  Musculoskeletal:        General: No deformity.     Comments: No midline spinal tenderness.  Mild left paraspinous muscle tenderness in the lumbar spine region.  No step-offs or deformity noted.  Skin:    General: Skin is warm and dry.     Capillary Refill: Capillary refill takes less than 2 seconds.  Neurological:     Mental Status: She is alert and oriented to person, place, and time.     Comments: About a 5 strength in bilateral lower extremities.  Romberg negative.  Gait normal.   Reflexes normal.  Psychiatric:        Mood and Affect: Mood normal.        Behavior: Behavior normal.    ED Results / Procedures / Treatments   Labs (all labs ordered are listed, but only abnormal results are displayed) Labs Reviewed -  No data to display  EKG None  Radiology No results found.  Procedures Procedures   Medications Ordered in ED Medications - No data to display  ED Course  I have reviewed the triage vital signs and the nursing notes.  Pertinent labs & imaging results that were available during my care of the patient were reviewed by me and considered in my medical decision making (see chart for details).    MDM Rules/Calculators/A&P                         Patient has no significant red flags for back pain including chronic corticosteroid use, history of cancer, fever, intravenous drug use.  Patient has known nontraumatic mechanism for recent back pain.  Neurovascularly intact, minimal clinical concern for cauda equina syndrome, or other spinal cord compression.  Patient has tried no medications at this time.  Patient presentation is consistent with lower back pain, lumbar muscle spasm.  Patient does not have any symptoms of sciatica.  Negative straight leg raise bilaterally.  Recommend ibuprofen, Tylenol, muscle relaxant to begin with.  Recommend follow-up with primary care.  Return precautions given.  Patient discharged in stable condition. Final Clinical Impression(s) / ED Diagnoses Final diagnoses:  Acute left-sided low back pain without sciatica    Rx / DC Orders ED Discharge Orders          Ordered    methocarbamol (ROBAXIN) 500 MG tablet  2 times daily        06/07/21 1158             Castle Lamons, Joesph Fillers, PA-C 06/07/21 1203    Godfrey Pick, MD 06/08/21 [phone]

## 2021-06-13 NOTE — Progress Notes (Deleted)
[facility] [address], [place], [place] [address] [phone]  Date:  06/15/2021   Name:  Jamie Levy   DOB:  02/04/1961   MRN:  [id number]  PCP:  Darreld Mclean, MD    Chief Complaint: No chief complaint on file.   History of Present Illness:  Jamie Levy is a 60 y.o. very pleasant female patient who presents with the following:  Patient seen today for ER follow-up Most recent visit with myself was a virtual visit in January for COVID-19 follow-up Most recent labs on chart from January  She was seen in the emergency room on September 28 with low back pain: Patient has no significant red flags for back pain including chronic corticosteroid use, history of cancer, fever, intravenous drug use.  Patient has known nontraumatic mechanism for recent back pain.  Neurovascularly intact, minimal clinical concern for cauda equina syndrome, or other spinal cord compression.  Patient has tried no medications at this time.  Patient presentation is consistent with lower back pain, lumbar muscle spasm.  Patient does not have any symptoms of sciatica.  Negative straight leg raise bilaterally.  Recommend ibuprofen, Tylenol, muscle relaxant to begin with.  Recommend follow-up with primary care.  Return precautions given.  Patient discharged in stable condition.   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   Facial rash 05/20/2012   Weight loss, abnormal 07/09/2011   Skin cancer 02/16/2011   CONSTIPATION 12/02/2009   Flatulence, eructation, and gas pain 12/02/2009   ABDOMINAL PAIN RIGHT LOWER QUADRANT 12/02/2009   SLEEP APNEA 04/27/2009   IRRITABLE BOWEL  SYNDROME 03/28/2009   RECTAL BLEEDING 03/15/2009   Dyslipidemia 03/11/2009   DEPRESSION 03/11/2009   SINUSITIS, CHRONIC 03/11/2009   Diverticulosis of colon (without mention of hemorrhage) 03/11/2009   Abdominal pain, left lower quadrant 03/11/2009    Past Medical History:  Diagnosis Date   Abdominal pain, left lower quadrant    Allergy    Anxiety    Depressive disorder, not elsewhere classified    Diarrhea of presumed infectious origin    Diverticulosis of colon (without mention of hemorrhage)    Facial ringworm May '14   Irritable bowel syndrome    Other and unspecified hyperlipidemia    Sleep apnea    Unspecified sinusitis (chronic)    Unspecified sleep apnea     Past Surgical History:  Procedure Laterality Date   ABDOMINAL HYSTERECTOMY  1997   BSo- Fibroids   COLONOSCOPY     elbow cystectomy     NASAL SINUS SURGERY     x 7 most recent June '14    Social History   Tobacco Use   Smoking status: Never   Smokeless tobacco: Never  Vaping Use   Vaping Use: Never used  Substance Use Topics   Alcohol use: Yes    Alcohol/week: 3.0 standard drinks    Types: 3 Standard drinks or equivalent per week    Comment: very rare, avoids alcohol when depressed   Drug use: No    Family History  Problem Relation Age of Onset   Ovarian cancer Mother    Prostate cancer Father    Colon polyps Father  Heart disease Father        CAD/CABG/Stents   Melanoma Father    Depression Father        suicidal in the past   Mental illness Father    Bladder Cancer Father    Colon cancer Neg Hx    Stomach cancer Neg Hx    Esophageal cancer Neg Hx     Allergies  Allergen Reactions   Compazine [Prochlorperazine Maleate] Anaphylaxis    Stroke like symptoms    Iodinated Diagnostic Agents Hives   Iohexol Hives and Other (See Comments)     Code: HIVES, Desc: pt developed one hive on rt upper arm and itching on upper lip after receiving 168ml omni 300, needs to be pre medicated, Onset  Date: [id number] 05-17-2014 Patient able to tolerate water soluble PO contrast w/o reaction. rsm MUST PRE-MEDICATE WITH BENADRYL   Codeine Nausea And Vomiting   Buprenorphine Hcl Nausea And Vomiting     give benadryl IV side effects lighten   Morphine And Related Nausea And Vomiting   Sulfamethoxazole-Trimethoprim Hives and Other (See Comments)    Makes her anxious & keeps her up all night    Medication list has been reviewed and updated.  Current Outpatient Medications on File Prior to Visit  Medication Sig Dispense Refill   aspirin 81 MG tablet Take 81 mg by mouth daily.     buPROPion (WELLBUTRIN XL) 300 MG 24 hr tablet Take 300 mg by mouth daily.     cetirizine (ZYRTEC) 10 MG tablet Take 10 mg by mouth daily.     fluticasone (FLONASE) 50 MCG/ACT nasal spray Place 2 sprays into both nostrils daily. 16 mL 12   lubiprostone (AMITIZA) 24 MCG capsule Take 1 capsule (24 mcg total) by mouth 2 (two) times daily with a meal. 60 capsule 3   methocarbamol (ROBAXIN) 500 MG tablet Take 1 tablet (500 mg total) by mouth 2 (two) times daily. 20 tablet 0   ondansetron (ZOFRAN ODT) 4 MG disintegrating tablet Take 1 tablet (4 mg total) by mouth every 8 (eight) hours as needed for nausea or vomiting. 20 tablet 0   rosuvastatin (CRESTOR) 20 MG tablet TAKE 1 TABLET BY MOUTH EVERY DAY 90 tablet 3   traZODone (DESYREL) 50 MG tablet take 1 to 2 tablets by mouth once daily  0   valACYclovir (VALTREX) 1000 MG tablet TAKE 1 TABLET (1,000 MG TOTAL) BY MOUTH 2 (TWO) TIMES DAILY AS NEEDED. AS NEEDED FOR COLD SORES 180 tablet 1   Venlafaxine HCl 225 MG TB24 Take 1 tablet by mouth daily.      No current facility-administered medications on file prior to visit.    Review of Systems:  As per HPI- otherwise negative.   Physical Examination: There were no vitals filed for this visit. There were no vitals filed for this visit. There is no height or weight on file to calculate BMI. Ideal Body Weight:    GEN: no  acute distress. HEENT: Atraumatic, Normocephalic.  Ears and Nose: No external deformity. CV: RRR, No M/G/R. No JVD. No thrill. No extra heart sounds. PULM: CTA B, no wheezes, crackles, rhonchi. No retractions. No resp. distress. No accessory muscle use. ABD: S, NT, ND, +BS. No rebound. No HSM. EXTR: No c/c/e PSYCH: Normally interactive. Conversant.    Assessment and Plan: ***  Signed Lamar Blinks, MD

## 2021-06-15 ENCOUNTER — Ambulatory Visit: Payer: 59 | Admitting: Family Medicine

## 2021-06-18 NOTE — Progress Notes (Addendum)
[place] at [facility] [address] [place], [place] [address] [phone] [id number]  Date:  06/21/2021   Name:  Jamie Levy   DOB:  08-19-1961   MRN:  [id number]  PCP:  Darreld Mclean, MD    Chief Complaint: ER follow up (06/07/21: Back Pain- Rx robaxin 500 BID. Pt says she has not taken this but has it just in case. She started Osteo Bi-Flex after this episode. She would like labs and your opinion. )   History of Present Illness:  Jamie Levy is a 60 y.o. very pleasant female patient who presents with the following:   History of sleep apnea, IBS, dyslipidemia, depression, skin cancer, prediabetes, osteopenia noted on bone density scan in 2020   Last seen by myself virtually in January of this year  She is actually moving to [facility] a few months-she is very excited about this move She is doing a lot of increased activities related to packing, prepping furniture, etc She threw her back out badly on 9/27- she actually ended up in the ER the next day  Her back is much better but she admits she is a bit concerned as the ER doc mentioned "bone cancer" to her for some reason  The pain was going down the left leg only- however this is now resolved However no numbness or tingling noted  She has used some ibuprofen   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[employer], was having a lot of fun with it   Shingrix Covid booster-encouraged Colon cancer screening-this is scheduled Flu shot - she declines today  Bone density scan can be updated -will order for her today Mammo due soon-ordered Labs about one year ago   BP Readings from Last 3 Encounters:  06/21/21 124/80  06/07/21 (!) 132/92  01/17/21 (!) 99/59     Patient Active Problem List   Diagnosis Date Noted   Hx of adenomatous colonic polyps 10/07/2020   Prediabetes 05/21/2020   Osteopenia 06/01/2019   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   Facial rash 05/20/2012   Weight loss, abnormal 07/09/2011   Skin cancer 02/16/2011   CONSTIPATION 12/02/2009   Flatulence, eructation, and gas pain 12/02/2009   ABDOMINAL PAIN RIGHT LOWER QUADRANT 12/02/2009   SLEEP APNEA 04/27/2009   IRRITABLE BOWEL SYNDROME 03/28/2009   RECTAL BLEEDING 03/15/2009   Dyslipidemia 03/11/2009   DEPRESSION 03/11/2009   SINUSITIS, CHRONIC 03/11/2009   Diverticulosis of colon (without mention of hemorrhage) 03/11/2009   Abdominal pain, left lower quadrant 03/11/2009    Past Medical History:  Diagnosis Date   Abdominal pain, left lower quadrant    Allergy    Anxiety    Depressive disorder, not elsewhere classified    Diarrhea of presumed infectious origin    Diverticulosis of colon (without mention of hemorrhage)    Facial ringworm May '14   Irritable bowel syndrome    Other and unspecified hyperlipidemia    Sleep apnea    Unspecified sinusitis (chronic)    Unspecified sleep apnea     Past Surgical History:  Procedure Laterality Date   ABDOMINAL HYSTERECTOMY  1997   BSo- Fibroids   COLONOSCOPY     elbow cystectomy     NASAL SINUS SURGERY     x 7 most recent June '14    Social History   Tobacco Use   Smoking status: Never  Smokeless tobacco: Never  Vaping Use   Vaping Use: Never used  Substance Use Topics   Alcohol use: Yes    Alcohol/week: 3.0 standard drinks    Types: 3 Standard drinks or equivalent per week    Comment: very rare, avoids alcohol when depressed   Drug use: No    Family History  Problem Relation Age of Onset   Ovarian cancer Mother    Prostate cancer Father    Colon polyps Father    Heart disease Father        CAD/CABG/Stents   Melanoma Father    Depression Father        suicidal in the past   Mental illness Father    Bladder Cancer Father    Colon cancer Neg Hx    Stomach cancer Neg Hx    Esophageal cancer Neg Hx     Allergies  Allergen Reactions   Compazine  [Prochlorperazine Maleate] Anaphylaxis    Stroke like symptoms    Iodinated Diagnostic Agents Hives   Iohexol Hives and Other (See Comments)     Code: HIVES, Desc: pt developed one hive on rt upper arm and itching on upper lip after receiving 159ml omni 300, needs to be pre medicated, Onset Date: [id number] 05-17-2014 Patient able to tolerate water soluble PO contrast w/o reaction. rsm MUST PRE-MEDICATE WITH BENADRYL   Codeine Nausea And Vomiting   Buprenorphine Hcl Nausea And Vomiting     give benadryl IV side effects lighten   Morphine And Related Nausea And Vomiting   Sulfamethoxazole-Trimethoprim Hives and Other (See Comments)    Makes her anxious & keeps her up all night    Medication list has been reviewed and updated.  Current Outpatient Medications on File Prior to Visit  Medication Sig Dispense Refill   aspirin 81 MG tablet Take 81 mg by mouth daily.     buPROPion (WELLBUTRIN XL) 300 MG 24 hr tablet Take by mouth.     cetirizine (ZYRTEC) 10 MG tablet Take 10 mg by mouth daily.     fluticasone (FLONASE) 50 MCG/ACT nasal spray Place 2 sprays into both nostrils daily. 16 mL 12   lubiprostone (AMITIZA) 24 MCG capsule Take 1 capsule (24 mcg total) by mouth 2 (two) times daily with a meal. 60 capsule 3   methocarbamol (ROBAXIN) 500 MG tablet Take 1 tablet (500 mg total) by mouth 2 (two) times daily. 20 tablet 0   ondansetron (ZOFRAN ODT) 4 MG disintegrating tablet Take 1 tablet (4 mg total) by mouth every 8 (eight) hours as needed for nausea or vomiting. 20 tablet 0   rosuvastatin (CRESTOR) 20 MG tablet TAKE 1 TABLET BY MOUTH EVERY DAY 90 tablet 3   traZODone (DESYREL) 50 MG tablet take 1 to 2 tablets by mouth once daily  0   UNABLE TO FIND Take 2 capsules by mouth daily. Med Name: Osteo Bi-Flex     valACYclovir (VALTREX) 1000 MG tablet TAKE 1 TABLET (1,000 MG TOTAL) BY MOUTH 2 (TWO) TIMES DAILY AS NEEDED. AS NEEDED FOR COLD SORES 180 tablet 1   Venlafaxine HCl 225 MG TB24 Take 1 tablet  by mouth daily.      No current facility-administered medications on file prior to visit.    Review of Systems:  As per HPI- otherwise negative.   Physical Examination: Vitals:   06/21/21 1440  BP: 124/80  Pulse: 74  Resp: 18  Temp: 97.9 F (36.6 C)  SpO2: 100%   Vitals:  06/21/21 1440  Weight: 137 lb (62.1 kg)  Height: 5\' 2"  (1.575 m)   Body mass index is 25.06 kg/m. Ideal Body Weight: Weight in (lb) to have BMI = 25: 136.4  GEN: no acute distress.  Normal weight, looks well HEENT: Atraumatic, Normocephalic.  Ears and Nose: No external deformity. CV: RRR, No M/G/R. No JVD. No thrill. No extra heart sounds. PULM: CTA B, no wheezes, crackles, rhonchi. No retractions. No resp. distress. No accessory muscle use. ABD: S, NT, ND, +BS. No rebound. No HSM. EXTR: No c/c/e PSYCH: Normally interactive. Conversant.  Normal thoracolumbar range of motion.  She has some tenderness over the left sacroiliac joint area.  Normal bilateral lower extremity strength, sensation, deep tendon reflex.  Negative straight leg raise bilaterally   Assessment and Plan: Prediabetes - Plan: Comprehensive metabolic panel, Hemoglobin A1c  Dyslipidemia - Plan: Lipid panel  Screening for thyroid disorder - Plan: TSH  Screening for deficiency anemia - Plan: CBC  Fatigue, unspecified type - Plan: TSH, VITAMIN D 25 Hydroxy (Vit-D Deficiency, Fractures)  Encounter for screening mammogram for malignant neoplasm of breast - Plan: MM 3D SCREEN BREAST BILATERAL  Estrogen deficiency - Plan: DG Bone Density  Lumbar pain - Plan: DG Lumbar Spine Complete  Patient seen today with concern of recent lumbar strain.  Her symptoms are significantly better, she does not need any medication.  She notes that the ER doctor mentioned to her something like "I do not think it is bone cancer" which concerned her a bit.  We will obtain plain films of her back for reassurance  Otherwise, she will let me know if her back  does not continue to get better.  I advised her she may need to use a bit more care with her back in the future as this could happen again.  Try not to overdo it when it comes to lifting Will plan further follow- up pending labs.   Signed Lamar Blinks, MD  Received labs as below, letter to pt  Results for orders placed or performed in visit on 06/21/21  CBC  Result Value Ref Range   WBC 5.6 4.0 - 10.5 K/uL   RBC 4.47 3.87 - 5.11 Mil/uL   Platelets 291.0 150.0 - 400.0 K/uL   Hemoglobin 13.1 12.0 - 15.0 g/dL   HCT 39.8 36.0 - 46.0 %   MCV 89.0 78.0 - 100.0 fl   MCHC 32.9 30.0 - 36.0 g/dL   RDW 13.2 11.5 - 15.5 %  Comprehensive metabolic panel  Result Value Ref Range   Sodium 136 135 - 145 mEq/L   Potassium 4.3 3.5 - 5.1 mEq/L   Chloride 98 96 - 112 mEq/L   CO2 31 19 - 32 mEq/L   Glucose, Bld 87 70 - 99 mg/dL   BUN 13 6 - 23 mg/dL   Creatinine, Ser 0.80 0.40 - 1.20 mg/dL   Total Bilirubin 0.3 0.2 - 1.2 mg/dL   Alkaline Phosphatase 54 39 - 117 U/L   AST 21 0 - 37 U/L   ALT 17 0 - 35 U/L   Total Protein 6.8 6.0 - 8.3 g/dL   Albumin 4.3 3.5 - 5.2 g/dL   GFR 78.95 >60.00 mL/min   Calcium 9.2 8.4 - 10.5 mg/dL  Hemoglobin A1c  Result Value Ref Range   Hgb A1c MFr Bld 6.0 4.6 - 6.5 %  Lipid panel  Result Value Ref Range   Cholesterol 195 0 - 200 mg/dL   Triglycerides 88.0 0.0 -  149.0 mg/dL   HDL 80.10 >39.00 mg/dL   VLDL 17.6 0.0 - 40.0 mg/dL   LDL Cholesterol 98 0 - 99 mg/dL   Total CHOL/HDL Ratio 2    NonHDL 115.20   TSH  Result Value Ref Range   TSH 4.92 0.35 - 5.50 uIU/mL  VITAMIN D 25 Hydroxy (Vit-D Deficiency, Fractures)  Result Value Ref Range   VITD 53.46 30.00 - 100.00 ng/mL

## 2021-06-21 ENCOUNTER — Other Ambulatory Visit: Payer: Self-pay

## 2021-06-21 ENCOUNTER — Ambulatory Visit (HOSPITAL_BASED_OUTPATIENT_CLINIC_OR_DEPARTMENT_OTHER)
Admission: RE | Admit: 2021-06-21 | Discharge: 2021-06-21 | Disposition: A | Discharge status: Home / Self Care | Payer: 59 | Source: Ambulatory Visit | Attending: Family Medicine | Admitting: Family Medicine

## 2021-06-21 ENCOUNTER — Ambulatory Visit: Payer: 59 | Admitting: Family Medicine

## 2021-06-21 VITALS — BP 124/80 | HR 74 | Temp 97.9°F | Resp 18 | Ht 62.0 in | Wt 137.0 lb

## 2021-06-21 DIAGNOSIS — M545 Low back pain, unspecified: Secondary | ICD-10-CM | POA: Insufficient documentation

## 2021-06-21 DIAGNOSIS — R5383 Other fatigue: Secondary | ICD-10-CM

## 2021-06-21 DIAGNOSIS — Z1329 Encounter for screening for other suspected endocrine disorder: Secondary | ICD-10-CM

## 2021-06-21 DIAGNOSIS — E785 Hyperlipidemia, unspecified: Secondary | ICD-10-CM | POA: Diagnosis not present

## 2021-06-21 DIAGNOSIS — Z1231 Encounter for screening mammogram for malignant neoplasm of breast: Secondary | ICD-10-CM

## 2021-06-21 DIAGNOSIS — Z130 Encounter for screening for diseases of the blood and blood-forming organs and certain disorders involving the immune mechanism: Secondary | ICD-10-CM

## 2021-06-21 DIAGNOSIS — R7303 Prediabetes: Secondary | ICD-10-CM | POA: Diagnosis not present

## 2021-06-21 DIAGNOSIS — E2839 Other primary ovarian failure: Secondary | ICD-10-CM

## 2021-06-21 MED ORDER — FLUTICASONE PROPIONATE 50 MCG/ACT NA SUSP: 2.0000 | Freq: Every day | NASAL | 12 refills | Status: DC

## 2021-06-21 NOTE — Patient Instructions (Signed)
Good to see you today!  Best of luck in your move- we will miss seeing you!   Please go to lab and then to x-ray You can call [facility] and schedule your mammogram and bone density  Address: [address] [place], [place], [place] [address] Phone: [phone]  I would recommend getting your covid booster and the shingles vaccine series BP looks fine!    Let me know if your back pain comes back

## 2021-06-22 LAB — CBC
HCT: 39.8 % (ref 36.0–46.0)
Hemoglobin: 13.1 g/dL (ref 12.0–15.0)
MCHC: 32.9 g/dL (ref 30.0–36.0)
MCV: 89 fl (ref 78.0–100.0)
Platelets: 291 10*3/uL (ref 150.0–400.0)
RBC: 4.47 Mil/uL (ref 3.87–5.11)
RDW: 13.2 % (ref 11.5–15.5)
WBC: 5.6 10*3/uL (ref 4.0–10.5)

## 2021-06-22 LAB — COMPREHENSIVE METABOLIC PANEL
ALT: 17 U/L (ref 0–35)
AST: 21 U/L (ref 0–37)
Albumin: 4.3 g/dL (ref 3.5–5.2)
Alkaline Phosphatase: 54 U/L (ref 39–117)
BUN: 13 mg/dL (ref 6–23)
CO2: 31 mEq/L (ref 19–32)
Calcium: 9.2 mg/dL (ref 8.4–10.5)
Chloride: 98 mEq/L (ref 96–112)
Creatinine, Ser: 0.8 mg/dL (ref 0.40–1.20)
GFR: 78.95 mL/min (ref >60.00)
Glucose, Bld: 87 mg/dL (ref 70–99)
Potassium: 4.3 mEq/L (ref 3.5–5.1)
Sodium: 136 mEq/L (ref 135–145)
Total Bilirubin: 0.3 mg/dL (ref 0.2–1.2)
Total Protein: 6.8 g/dL (ref 6.0–8.3)

## 2021-06-22 LAB — TSH: TSH: 4.92 u[IU]/mL (ref 0.35–5.50)

## 2021-06-22 LAB — LIPID PANEL
Cholesterol: 195 mg/dL (ref 0–200)
HDL: 80.1 mg/dL (ref >39.00)
LDL Cholesterol: 98 mg/dL (ref 0–99)
NonHDL: 115.2
Total CHOL/HDL Ratio: 2
Triglycerides: 88 mg/dL (ref 0.0–149.0)
VLDL: 17.6 mg/dL (ref 0.0–40.0)

## 2021-06-22 LAB — VITAMIN D 25 HYDROXY (VIT D DEFICIENCY, FRACTURES): VITD: 53.46 ng/mL (ref 30.00–100.00)

## 2021-06-22 LAB — HEMOGLOBIN A1C: Hgb A1c MFr Bld: 6 % (ref 4.6–6.5)

## 2021-06-26 ENCOUNTER — Encounter: Payer: Self-pay | Admitting: Family Medicine

## 2021-06-26 ENCOUNTER — Telehealth: Payer: Self-pay | Admitting: Family Medicine

## 2021-06-26 NOTE — Telephone Encounter (Signed)
Patient would like to receive a call to go over labs and imaging results. She was informed that everything was mailed to her but she would like a call back still. Please advice,.

## 2021-06-27 NOTE — Telephone Encounter (Signed)
Spoke with patient about results of xray and labs.  She wanted to know is there anything to do for scoliosis?  Also she spoke with [facility] about her bone density and they did not have an order.  Will fax over order.

## 2021-06-27 NOTE — Telephone Encounter (Signed)
LM requesting call back to review lab results.

## 2021-06-28 NOTE — Telephone Encounter (Signed)
I called her back, went over her labs and x-rays.  She does have scoliosis, if she likes when she gets settled in [facility]  she might establish with a spine physician to get their opinion.  However, nothing to do right now

## 2021-07-24 ENCOUNTER — Encounter: Payer: Self-pay | Admitting: Gastroenterology

## 2021-07-24 ENCOUNTER — Ambulatory Visit (AMBULATORY_SURGERY_CENTER): Payer: 59 | Admitting: *Deleted

## 2021-07-24 VITALS — Ht 62.0 in | Wt 145.0 lb

## 2021-07-24 DIAGNOSIS — Z86010 Personal history of colonic polyps: Secondary | ICD-10-CM

## 2021-07-24 MED ORDER — NA SULFATE-K SULFATE-MG SULF 17.5-3.13-1.6 GM/177ML PO SOLN: 1.0000 | Freq: Once | ORAL | 0 refills | Status: AC

## 2021-07-24 MED ORDER — ONDANSETRON HCL 4 MG PO TABS: 4.0000 mg | ORAL_TABLET | Freq: Once | ORAL | 0 refills | Status: AC

## 2021-07-24 NOTE — Progress Notes (Signed)
Virtual pre visit completed over telephone. Instructions sent through secure e-mail [email or web address]    No egg or soy allergy known to patient  No issues known to pt with past sedation with any surgeries or procedures Patient denies ever being told they had issues or difficulty with intubation  No FH of Malignant Hyperthermia Pt is not on diet pills Pt is not on  home 02  Pt is not on blood thinners  Pt denies issues with constipation  No A fib or A flutter  Take one  -4 mg Zofran 30-60 minutes before the prep dose the day beforethe colonoscopy   Take 1-4 mg Zofran 30-60 minutes before the prep dose the day of the colonoscopy     Discussed with pt there will be an out-of-pocket cost for prep and that varies from $0 to 70 +  dollars - pt verbalized understanding   Due to the COVID-19 pandemic we are asking patients to follow certain guidelines in [place] and the [place]   Pt aware of COVID protocols and [facility] guidelines

## 2021-07-27 ENCOUNTER — Telehealth: Payer: Self-pay | Admitting: Gastroenterology

## 2021-07-27 LAB — HM DEXA SCAN

## 2021-07-27 LAB — HM MAMMOGRAPHY

## 2021-07-27 NOTE — Telephone Encounter (Signed)
Called [facility]- Generic Suprep is free- Pharmacy will fill- pt called and made aware

## 2021-07-27 NOTE — Telephone Encounter (Signed)
Patient called and had a previsit on 11/14.  Her insurance will not pay for the prep she was given and she is asking for a different prep along with the instructions for the new prep.  Please call patient and advise.  Thank you.

## 2021-07-31 ENCOUNTER — Encounter: Payer: Self-pay | Admitting: Family Medicine

## 2021-08-07 ENCOUNTER — Encounter: Payer: 59 | Admitting: Gastroenterology

## 2021-08-07 ENCOUNTER — Telehealth: Payer: Self-pay | Admitting: Gastroenterology

## 2021-08-07 NOTE — Telephone Encounter (Signed)
Patients husband just called to advise the whole family is sick with a cold and she will not be able to come in today.

## 2021-08-07 NOTE — Telephone Encounter (Signed)
Got it, thanks!

## 2021-08-11 ENCOUNTER — Other Ambulatory Visit: Payer: Self-pay | Admitting: Family Medicine

## 2021-08-11 DIAGNOSIS — E785 Hyperlipidemia, unspecified: Secondary | ICD-10-CM

## 2021-08-16 ENCOUNTER — Encounter: Payer: Self-pay | Admitting: Gastroenterology

## 2021-09-18 ENCOUNTER — Telehealth: Payer: Self-pay | Admitting: Family Medicine

## 2021-09-18 ENCOUNTER — Other Ambulatory Visit: Payer: Self-pay

## 2021-09-18 DIAGNOSIS — E785 Hyperlipidemia, unspecified: Secondary | ICD-10-CM

## 2021-09-18 MED ORDER — ROSUVASTATIN CALCIUM 20 MG PO TABS: 20.0000 mg | ORAL_TABLET | Freq: Every day | ORAL | 3 refills | Status: AC

## 2021-09-18 NOTE — Telephone Encounter (Signed)
Refill sent.

## 2021-09-18 NOTE — Telephone Encounter (Signed)
Medication:  rosuvastatin (CRESTOR) 20 MG tablet [[id number]]     Has the patient contacted their pharmacy? No. (If no, request that the patient contact the pharmacy for the refill.) (If yes, when and what did the pharmacy advise?)     Preferred Pharmacy (with phone number or street name):  [facility]  [address], [place], [place] [address] [phone]  Agent: Please be advised that RX refills may take up to 3 business days. We ask that you follow-up with your pharmacy.

## 2021-09-22 ENCOUNTER — Telehealth: Payer: Self-pay | Admitting: Family Medicine

## 2021-09-22 ENCOUNTER — Other Ambulatory Visit: Payer: Self-pay

## 2021-09-22 DIAGNOSIS — B001 Herpesviral vesicular dermatitis: Secondary | ICD-10-CM

## 2021-09-22 MED ORDER — VALACYCLOVIR HCL 1 G PO TABS
1000.0000 mg | ORAL_TABLET | Freq: Two times a day (BID) | ORAL | 1 refills | Status: AC | PRN
Start: 2021-09-22 — End: ?

## 2021-09-22 NOTE — Telephone Encounter (Signed)
Medication:  valACYclovir (VALTREX) 1000 MG tablet [[id number]]    Has the patient contacted their pharmacy? No. (If no, request that the patient contact the pharmacy for the refill.) (If yes, when and what did the pharmacy advise?)     Preferred Pharmacy (with phone number or street name):  [id number] [place], [place] - [address]  [address], [place] [address]  Phone:  [phone]  Fax:  [phone]  Agent: Please be advised that RX refills may take up to 3 business days. We ask that you follow-up with your pharmacy.  Pt lost meds during moving process and would like to know rx can be refilled. Advised pt appt may need to be sched. Please advise.

## 2021-09-22 NOTE — Telephone Encounter (Signed)
Refill senf.

## 2021-09-28 ENCOUNTER — Telehealth: Payer: Self-pay | Admitting: Gastroenterology

## 2021-09-28 NOTE — Telephone Encounter (Signed)
Agree with liquid diet and slowly advance diet as tolerated. Use Miralax 1 capful daily to improve bowel habits and follow up in office visit. Call with any worsening or changes in symptoms

## 2021-09-28 NOTE — Telephone Encounter (Signed)
Spoke with the patient. She woke in the early morning hours with lower abdominal pain that she describes as sharp and severe. She passed stool that was "long and snake like" and then felt a little better. The pattern repeated itself 3 more times through the day. She denies any flatus. No Fever or nausea. She is better now, but her lower abdomen is sore. She ate "baby kale" last night and wonders could this have caused her symptoms. She is putting herself on a liquid to soft diet for now. She has an appointment for follow up scheduled in February.  Any suggestions?

## 2021-09-28 NOTE — Telephone Encounter (Signed)
Patient called.  She has been having severe abdominal pain since 1 a.m. and is so "blown up" she looks like she's pregnant.  Please call patient and advise.  Thank you.

## 2021-10-04 ENCOUNTER — Telehealth: Payer: Self-pay | Admitting: Gastroenterology

## 2021-10-04 NOTE — Telephone Encounter (Signed)
Called the patient. No answer. Left a message advising Miralax.

## 2021-10-04 NOTE — Telephone Encounter (Signed)
Patient called to let you know that since talking with you last she still has not gone to the bathroom.  She has diverticulitis and was prescribed Cipro, 500 mg, twice a day and Metronidizoxle, 500 mg, three times a day.  Please advise patient as to what she should do.  Thank you.

## 2021-10-05 NOTE — Telephone Encounter (Signed)
error 

## 2021-10-09 ENCOUNTER — Ambulatory Visit: Payer: 59 | Admitting: *Deleted

## 2021-10-09 ENCOUNTER — Other Ambulatory Visit: Payer: Self-pay

## 2021-10-09 VITALS — Ht 62.0 in | Wt 131.0 lb

## 2021-10-09 DIAGNOSIS — Z86010 Personal history of colonic polyps: Secondary | ICD-10-CM

## 2021-10-09 NOTE — Progress Notes (Signed)

## 2021-10-16 ENCOUNTER — Encounter: Payer: Self-pay | Admitting: Gastroenterology

## 2021-10-23 ENCOUNTER — Other Ambulatory Visit: Payer: Self-pay

## 2021-10-23 ENCOUNTER — Encounter: Payer: Self-pay | Admitting: Gastroenterology

## 2021-10-23 ENCOUNTER — Ambulatory Visit (AMBULATORY_SURGERY_CENTER): Payer: 59 | Admitting: Gastroenterology

## 2021-10-23 VITALS — BP 103/88 | HR 74 | Temp 98.0°F | Resp 13 | Ht 62.0 in | Wt 131.0 lb

## 2021-10-23 DIAGNOSIS — K581 Irritable bowel syndrome with constipation: Secondary | ICD-10-CM | POA: Diagnosis not present

## 2021-10-23 DIAGNOSIS — K639 Disease of intestine, unspecified: Secondary | ICD-10-CM | POA: Diagnosis not present

## 2021-10-23 DIAGNOSIS — Z86010 Personal history of colonic polyps: Secondary | ICD-10-CM | POA: Diagnosis not present

## 2021-10-23 HISTORY — PX: COLONOSCOPY: SHX174

## 2021-10-23 MED ORDER — SODIUM CHLORIDE 0.9 % IV SOLN: 500.0000 mL | Freq: Once | INTRAVENOUS | Status: DC

## 2021-10-23 MED ORDER — LINACLOTIDE 145 MCG PO CAPS
145.0000 ug | ORAL_CAPSULE | Freq: Every day | ORAL | Status: AC
Start: 2021-10-23 — End: 2022-01-21

## 2021-10-23 NOTE — Progress Notes (Signed)
Called to room to assist during endoscopic procedure.  Patient ID and intended procedure confirmed with present staff. Received instructions for my participation in the procedure from the performing physician.  
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
## 2021-10-23 NOTE — Patient Instructions (Signed)
Resume previous diet and medications Awaiting pathology results. Use Linzess 145 mcg PO daily, Rx for IBS constipation. Will need to consider segmental sigmoid colectomy if recurrent episodes of sigmoid diverticulitis Schedule virtual visit at next available appointment with [facility]  YOU HAD AN ENDOSCOPIC PROCEDURE TODAY AT [place]:   Refer to the procedure report that was given to you for any specific questions about what was found during the examination.  If the procedure report does not answer your questions, please call your gastroenterologist to clarify.  If you requested that your care partner not be given the details of your procedure findings, then the procedure report has been included in a sealed envelope for you to review at your convenience later.  YOU SHOULD EXPECT: Some feelings of bloating in the abdomen. Passage of more gas than usual.  Walking can help get rid of the air that was put into your GI tract during the procedure and reduce the bloating. If you had a lower endoscopy (such as a colonoscopy or flexible sigmoidoscopy) you may notice spotting of blood in your stool or on the toilet paper. If you underwent a bowel prep for your procedure, you may not have a normal bowel movement for a few days.  Please Note:  You might notice some irritation and congestion in your nose or some drainage.  This is from the oxygen used during your procedure.  There is no need for concern and it should clear up in a day or so.  SYMPTOMS TO REPORT IMMEDIATELY:  Following lower endoscopy (colonoscopy or flexible sigmoidoscopy):  Excessive amounts of blood in the stool  Significant tenderness or worsening of abdominal pains  Swelling of the abdomen that is new, acute  Fever of 100F or higher  For urgent or emergent issues, a gastroenterologist can be reached at any hour by calling [phone]. Do not use MyChart messaging for urgent concerns.    DIET:  We do recommend  a small meal at first, but then you may proceed to your regular diet.  Drink plenty of fluids but you should avoid alcoholic beverages for 24 hours.  ACTIVITY:  You should plan to take it easy for the rest of today and you should NOT DRIVE or use heavy machinery until tomorrow (because of the sedation medicines used during the test).    FOLLOW UP: Our staff will call the number listed on your records 48-72 hours following your procedure to check on you and address any questions or concerns that you may have regarding the information given to you following your procedure. If we do not reach you, we will leave a message.  We will attempt to reach you two times.  During this call, we will ask if you have developed any symptoms of COVID 19. If you develop any symptoms (ie: fever, flu-like symptoms, shortness of breath, cough etc.) before then, please call [phone].  If you test positive for Covid 19 in the 2 weeks post procedure, please call and report this information to [place].    If any biopsies were taken you will be contacted by phone or by letter within the next 1-3 weeks.  Please call us at [phone] if you have not heard about the biopsies in 3 weeks.    SIGNATURES/CONFIDENTIALITY: You and/or your care partner have signed paperwork which will be entered into your electronic medical record.  These signatures attest to the fact that that the information above on your After Visit Summary has  been reviewed and is understood.  Full responsibility of the confidentiality of this discharge information lies with you and/or your care-partner.

## 2021-10-23 NOTE — Progress Notes (Signed)
Pt's states no medical or surgical changes since previsit or office visit.  VS CW  

## 2021-10-23 NOTE — Op Note (Addendum)
[facility] Patient Name: Jamie Levy Procedure Date: 10/23/2021 2:04 PM MRN: [id number] Endoscopist: Mauri Pole , MD Age: 61 Referring MD:  Date of Birth: [birth year] Gender: Female Account #: [id number] Procedure:                Colonoscopy Indications:              High risk colon cancer surveillance: Personal                            history of colonic polyps, High risk colon cancer                            surveillance: Personal history of adenoma less than                            10 mm in size. Recent h/o diverticulitis 6 weeks ago Medicines:                Monitored Anesthesia Care Procedure:                Pre-Anesthesia Assessment:                           - Prior to the procedure, a History and Physical                            was performed, and patient medications and                            allergies were reviewed. The patient's tolerance of                            previous anesthesia was also reviewed. The risks                            and benefits of the procedure and the sedation                            options and risks were discussed with the patient.                            All questions were answered, and informed consent                            was obtained. Prior Anticoagulants: The patient has                            taken no previous anticoagulant or antiplatelet                            agents. ASA Grade Assessment: II - A patient with                            mild systemic disease. After reviewing the risks  and benefits, the patient was deemed in                            satisfactory condition to undergo the procedure.                           After obtaining informed consent, the colonoscope                            was passed under direct vision. Throughout the                            procedure, the patient's blood pressure, pulse, and                             oxygen saturations were monitored continuously. The                            Olympus PCF-H190DL (#[id number]) Colonoscope was                            introduced through the anus and advanced to the the                            cecum, identified by appendiceal orifice and                            ileocecal valve. The colonoscopy was performed                            without difficulty. The patient tolerated the                            procedure well. The quality of the bowel                            preparation was adequate to identify polyps 6 mm                            and larger in size. The ileocecal valve,                            appendiceal orifice, and rectum were photographed. Scope In: 2:13:56 PM Scope Out: 2:33:13 PM Scope Withdrawal Time: 0 hours 12 minutes 6 seconds  Total Procedure Duration: 0 hours 19 minutes 17 seconds  Findings:                 The perianal and digital rectal examinations were                            normal.                           An area of mildly congested mucosa was found in the  cecum. Biopsies were taken with a cold forceps for                            histology.                           Multiple small and large-mouthed diverticula were                            found in the sigmoid colon. There was narrowing of                            the colon in association with the diverticular                            opening. There was evidence of diverticular spasm.                            Peri-diverticular erythema was seen.                           Non-bleeding external and internal hemorrhoids were                            found during retroflexion. The hemorrhoids were                            medium-sized. Complications:            No immediate complications. Estimated Blood Loss:     Estimated blood loss was minimal. Impression:               - Congested mucosa in the cecum. Biopsied.                            - Moderate diverticulosis in the sigmoid colon.                            There was narrowing of the colon in association                            with the diverticular opening. There was evidence                            of diverticular spasm. Peri-diverticular erythema                            was seen.                           - Non-bleeding external and internal hemorrhoids. Recommendation:           - Patient has a contact number available for                            emergencies. The signs and symptoms of potential  delayed complications were discussed with the                            patient. Return to normal activities tomorrow.                            Written discharge instructions were provided to the                            patient.                           - Resume previous diet.                           - Continue present medications.                           - Await pathology results.                           - Repeat colonoscopy in 5-10 years for surveillance                            based on pathology results.                           - Use Linzess (linaclotide) 145 mcg PO daily, Rx                            for 3 months for IBS-Constipation.                           - Follow up in GI office, next available                            appointment.                           - Please refer to [facility], patient is planning to                            move to the [facility] and she prefers to follow up                            with GI closer to her new home.                           - Will need to consider segmental sigmoid colectomt                            if has recurrent episodes of sigmoid diverticulitis Mauri Pole, MD 10/23/2021 2:41:14 PM This report has been signed electronically.

## 2021-10-23 NOTE — Progress Notes (Signed)
A and O x3. Report to RN. Tolerated MAC anesthesia well. 

## 2021-10-24 ENCOUNTER — Telehealth: Payer: Self-pay

## 2021-10-24 NOTE — Telephone Encounter (Signed)
Records faxed to [facility]. Requesting new patient appointment. Advised this is not a referral or a 2nd opinion request. Advised the patient is moving out of our area and is seeking a new GI provider.

## 2021-10-25 ENCOUNTER — Telehealth: Payer: Self-pay | Admitting: *Deleted

## 2021-10-25 ENCOUNTER — Telehealth: Payer: Self-pay

## 2021-10-25 NOTE — Telephone Encounter (Signed)
First follow up call attempt.  No answer .

## 2021-10-25 NOTE — Telephone Encounter (Signed)
°  Follow up Call-  Call back number 10/23/2021  Post procedure Call Back phone  # [phone]  Permission to leave phone message Yes  Some recent data might be hidden     Patient questions:  Do you have a fever, pain , or abdominal swelling? No. Pain Score  0 *  Have you tolerated food without any problems? Yes.    Have you been able to return to your normal activities? Yes.    Do you have any questions about your discharge instructions: Diet   No. Medications  No. Follow up visit  No.  Do you have questions or concerns about your Care? No.  Actions: * If pain score is 4 or above: No action needed, pain <4.  Have you developed a fever since your procedure? no  2.   Have you had an respiratory symptoms (SOB or cough) since your procedure? no  3.   Have you tested positive for COVID 19 since your procedure no  4.   Have you had any family members/close contacts diagnosed with the COVID 19 since your procedure?  no   If yes to any of these questions please route to Joylene John, RN and Joella Prince, RN

## 2021-10-31 ENCOUNTER — Encounter: Payer: Self-pay | Admitting: Gastroenterology

## 2021-10-31 NOTE — Telephone Encounter (Signed)
Opened in error

## 2021-11-10 ENCOUNTER — Telehealth: Payer: 59 | Admitting: Gastroenterology

## 2021-11-10 MED ORDER — RIFAXIMIN 550 MG PO TABS: 550.0000 mg | ORAL_TABLET | Freq: Three times a day (TID) | ORAL | 0 refills | Status: DC

## 2021-11-10 MED ORDER — HYOSCYAMINE SULFATE 0.125 MG SL SUBL: 0.1250 mg | SUBLINGUAL_TABLET | Freq: Every day | SUBLINGUAL | 1 refills | Status: DC | PRN

## 2021-11-10 NOTE — Progress Notes (Signed)
No response, no show for appointment ?

## 2022-05-03 ENCOUNTER — Other Ambulatory Visit: Payer: Self-pay | Admitting: Family Medicine

## 2023-09-09 ENCOUNTER — Telehealth: Payer: Self-pay | Admitting: Family Medicine

## 2023-09-09 NOTE — Telephone Encounter (Signed)
LVM advising patient she is due for annual physical. Requested call back to schedule appt.
